# Patient Record
Sex: Male | Born: 1975 | Race: White | Hispanic: No | Marital: Single | Smoking: Never smoker
Health system: Southern US, Community
[De-identification: ages and names within clinical notes are randomized; demographics above are authoritative.]

## PROBLEM LIST (undated history)

## (undated) DIAGNOSIS — E785 Hyperlipidemia, unspecified: Secondary | ICD-10-CM

## (undated) DIAGNOSIS — M79606 Pain in leg, unspecified: Secondary | ICD-10-CM

## (undated) DIAGNOSIS — K859 Acute pancreatitis without necrosis or infection, unspecified: Secondary | ICD-10-CM

## (undated) DIAGNOSIS — K589 Irritable bowel syndrome without diarrhea: Secondary | ICD-10-CM

## (undated) DIAGNOSIS — T7840XA Allergy, unspecified, initial encounter: Secondary | ICD-10-CM

## (undated) DIAGNOSIS — I10 Essential (primary) hypertension: Secondary | ICD-10-CM

## (undated) DIAGNOSIS — G43909 Migraine, unspecified, not intractable, without status migrainosus: Secondary | ICD-10-CM

## (undated) DIAGNOSIS — K219 Gastro-esophageal reflux disease without esophagitis: Secondary | ICD-10-CM

## (undated) DIAGNOSIS — M7130 Other bursal cyst, unspecified site: Secondary | ICD-10-CM

## (undated) DIAGNOSIS — K760 Fatty (change of) liver, not elsewhere classified: Secondary | ICD-10-CM

## (undated) DIAGNOSIS — Z8719 Personal history of other diseases of the digestive system: Secondary | ICD-10-CM

## (undated) DIAGNOSIS — D649 Anemia, unspecified: Secondary | ICD-10-CM

## (undated) DIAGNOSIS — N281 Cyst of kidney, acquired: Secondary | ICD-10-CM

## (undated) HISTORY — DX: Other bursal cyst, unspecified site: M71.30

## (undated) HISTORY — DX: Cyst of kidney, acquired: N28.1

## (undated) HISTORY — DX: Essential (primary) hypertension: I10

## (undated) HISTORY — DX: Fatty (change of) liver, not elsewhere classified: K76.0

## (undated) HISTORY — PX: UPPER GASTROINTESTINAL ENDOSCOPY: SHX188

## (undated) HISTORY — DX: Anemia, unspecified: D64.9

## (undated) HISTORY — PX: UPPER GI ENDOSCOPY: SHX6162

## (undated) HISTORY — DX: Allergy, unspecified, initial encounter: T78.40XA

## (undated) HISTORY — PX: LASIK: SHX215

## (undated) HISTORY — PX: COLONOSCOPY: SHX174

## (undated) HISTORY — DX: Irritable bowel syndrome, unspecified: K58.9

## (undated) HISTORY — PX: ESOPHAGOGASTRODUODENOSCOPY: SHX1529

## (undated) HISTORY — DX: Acute pancreatitis without necrosis or infection, unspecified: K85.90

## (undated) HISTORY — DX: Gastro-esophageal reflux disease without esophagitis: K21.9

## (undated) HISTORY — DX: Pain in leg, unspecified: M79.606

## (undated) HISTORY — DX: Hyperlipidemia, unspecified: E78.5

---
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
## 1999-08-18 ENCOUNTER — Other Ambulatory Visit: Admission: RE | Admit: 1999-08-18 | Discharge: 1999-08-18 | Payer: Self-pay | Admitting: Urology

## 2005-03-01 ENCOUNTER — Ambulatory Visit: Payer: Self-pay | Admitting: Internal Medicine

## 2005-03-27 ENCOUNTER — Ambulatory Visit: Payer: Self-pay | Admitting: Internal Medicine

## 2005-04-03 ENCOUNTER — Ambulatory Visit: Payer: Self-pay | Admitting: Internal Medicine

## 2005-05-29 ENCOUNTER — Ambulatory Visit: Payer: Self-pay | Admitting: Internal Medicine

## 2005-06-08 ENCOUNTER — Ambulatory Visit: Payer: Self-pay | Admitting: Internal Medicine

## 2005-08-09 ENCOUNTER — Ambulatory Visit: Payer: Self-pay | Admitting: Internal Medicine

## 2006-02-12 ENCOUNTER — Ambulatory Visit: Payer: Self-pay | Admitting: Internal Medicine

## 2006-06-21 ENCOUNTER — Ambulatory Visit: Payer: Self-pay | Admitting: Internal Medicine

## 2006-07-05 ENCOUNTER — Ambulatory Visit: Payer: Self-pay | Admitting: Internal Medicine

## 2006-11-14 ENCOUNTER — Ambulatory Visit: Payer: Self-pay | Admitting: Internal Medicine

## 2006-11-14 LAB — CONVERTED CEMR LAB
AST: 65 units/L — ABNORMAL HIGH (ref 0–37)
Albumin: 4.1 g/dL (ref 3.5–5.2)
Alkaline Phosphatase: 49 units/L (ref 39–117)
HDL: 40.2 mg/dL (ref >39.0)
LDL DIRECT: 146.6 mg/dL
Total Bilirubin: 0.9 mg/dL (ref 0.3–1.2)
Total Protein: 7.1 g/dL (ref 6.0–8.3)
VLDL: 45 mg/dL — ABNORMAL HIGH (ref 0–40)

## 2006-11-27 HISTORY — PX: WRIST FRACTURE SURGERY: SHX121

## 2006-11-27 HISTORY — PX: CARPAL TUNNEL RELEASE: SHX101

## 2006-12-12 ENCOUNTER — Ambulatory Visit: Payer: Self-pay | Admitting: Internal Medicine

## 2007-02-04 ENCOUNTER — Ambulatory Visit: Payer: Self-pay | Admitting: Internal Medicine

## 2007-02-04 LAB — CONVERTED CEMR LAB
AST: 43 units/L — ABNORMAL HIGH (ref 0–37)
Bilirubin, Direct: 0.2 mg/dL (ref 0.0–0.3)
LDL Cholesterol: 105 mg/dL — ABNORMAL HIGH (ref 0–99)
Total Protein: 6.7 g/dL (ref 6.0–8.3)
Triglycerides: 137 mg/dL (ref 0–149)

## 2007-02-11 ENCOUNTER — Ambulatory Visit: Payer: Self-pay | Admitting: Internal Medicine

## 2007-06-06 ENCOUNTER — Ambulatory Visit: Payer: Self-pay | Admitting: Internal Medicine

## 2007-06-06 LAB — CONVERTED CEMR LAB
ALT: 49 units/L (ref 0–53)
Albumin: 3.8 g/dL (ref 3.5–5.2)
Alkaline Phosphatase: 46 units/L (ref 39–117)
Cholesterol: 193 mg/dL (ref 0–200)
HDL: 36.3 mg/dL — ABNORMAL LOW (ref >39.0)
Total Protein: 6.7 g/dL (ref 6.0–8.3)

## 2007-06-13 ENCOUNTER — Ambulatory Visit: Payer: Self-pay | Admitting: Internal Medicine

## 2007-08-15 ENCOUNTER — Telehealth: Payer: Self-pay | Admitting: Internal Medicine

## 2007-08-15 ENCOUNTER — Ambulatory Visit: Payer: Self-pay | Admitting: Internal Medicine

## 2007-08-15 DIAGNOSIS — L255 Unspecified contact dermatitis due to plants, except food: Secondary | ICD-10-CM | POA: Insufficient documentation

## 2007-08-15 DIAGNOSIS — K589 Irritable bowel syndrome without diarrhea: Secondary | ICD-10-CM

## 2007-08-16 DIAGNOSIS — E781 Pure hyperglyceridemia: Secondary | ICD-10-CM | POA: Insufficient documentation

## 2007-08-16 DIAGNOSIS — K219 Gastro-esophageal reflux disease without esophagitis: Secondary | ICD-10-CM

## 2007-10-28 ENCOUNTER — Emergency Department (HOSPITAL_COMMUNITY): Admission: EM | Admit: 2007-10-28 | Discharge: 2007-10-28 | Payer: Self-pay | Admitting: Emergency Medicine

## 2007-10-30 ENCOUNTER — Ambulatory Visit (HOSPITAL_BASED_OUTPATIENT_CLINIC_OR_DEPARTMENT_OTHER): Admission: RE | Admit: 2007-10-30 | Discharge: 2007-10-30 | Payer: Self-pay | Admitting: Orthopedic Surgery

## 2007-11-08 ENCOUNTER — Ambulatory Visit: Payer: Self-pay | Admitting: Internal Medicine

## 2007-11-08 LAB — CONVERTED CEMR LAB
Cholesterol, target level: 200 mg/dL
HDL goal, serum: 40 mg/dL

## 2008-03-09 ENCOUNTER — Ambulatory Visit: Payer: Self-pay | Admitting: Internal Medicine

## 2008-03-09 DIAGNOSIS — I10 Essential (primary) hypertension: Secondary | ICD-10-CM | POA: Insufficient documentation

## 2008-03-09 DIAGNOSIS — T887XXA Unspecified adverse effect of drug or medicament, initial encounter: Secondary | ICD-10-CM

## 2008-03-09 DIAGNOSIS — M25519 Pain in unspecified shoulder: Secondary | ICD-10-CM | POA: Insufficient documentation

## 2008-03-09 LAB — CONVERTED CEMR LAB
ALT: 75 units/L — ABNORMAL HIGH (ref 0–53)
AST: 40 units/L — ABNORMAL HIGH (ref 0–37)
Bilirubin, Direct: 0.2 mg/dL (ref 0.0–0.3)
HDL: 40.5 mg/dL (ref >39.0)
Triglycerides: 162 mg/dL — ABNORMAL HIGH (ref 0–149)

## 2008-03-16 ENCOUNTER — Telehealth: Payer: Self-pay | Admitting: Internal Medicine

## 2008-03-30 ENCOUNTER — Telehealth: Payer: Self-pay | Admitting: Internal Medicine

## 2008-04-22 ENCOUNTER — Telehealth: Payer: Self-pay | Admitting: Internal Medicine

## 2008-09-07 ENCOUNTER — Ambulatory Visit: Payer: Self-pay | Admitting: Internal Medicine

## 2008-09-07 LAB — CONVERTED CEMR LAB
BUN: 14 mg/dL (ref 6–23)
Chloride: 105 meq/L (ref 96–112)
Creatinine, Ser: 1.1 mg/dL (ref 0.4–1.5)
GFR calc non Af Amer: 82 mL/min
Sodium: 142 meq/L (ref 135–145)

## 2009-02-15 ENCOUNTER — Ambulatory Visit: Payer: Self-pay | Admitting: Internal Medicine

## 2009-02-15 LAB — CONVERTED CEMR LAB
AST: 50 units/L — ABNORMAL HIGH (ref 0–37)
Albumin: 4 g/dL (ref 3.5–5.2)
Bilirubin Urine: NEGATIVE
Blood in Urine, dipstick: NEGATIVE
CO2: 30 meq/L (ref 19–32)
Cholesterol: 184 mg/dL (ref 0–200)
Creatinine, Ser: 1.2 mg/dL (ref 0.4–1.5)
Eosinophils Absolute: 0.1 10*3/uL (ref 0.0–0.7)
Glucose, Urine, Semiquant: NEGATIVE
HCT: 41.1 % (ref 39.0–52.0)
Hemoglobin: 14.1 g/dL (ref 13.0–17.0)
LDL Cholesterol: 118 mg/dL — ABNORMAL HIGH (ref 0–99)
Lymphs Abs: 1.6 10*3/uL (ref 0.7–4.0)
MCV: 91.9 fL (ref 78.0–100.0)
Monocytes Absolute: 0.4 10*3/uL (ref 0.1–1.0)
Monocytes Relative: 8.9 % (ref 3.0–12.0)
Neutro Abs: 2.1 10*3/uL (ref 1.4–7.7)
Neutrophils Relative %: 49.8 % (ref 43.0–77.0)
Protein, U semiquant: NEGATIVE
RDW: 11.4 % — ABNORMAL LOW (ref 11.5–14.6)
Sodium: 143 meq/L (ref 135–145)
TSH: 1.84 microintl units/mL (ref 0.35–5.50)
Total Bilirubin: 0.9 mg/dL (ref 0.3–1.2)
Total Protein: 6.9 g/dL (ref 6.0–8.3)
Triglycerides: 158 mg/dL — ABNORMAL HIGH (ref 0.0–149.0)
Urobilinogen, UA: 0.2
VLDL: 31.6 mg/dL (ref 0.0–40.0)
WBC Urine, dipstick: NEGATIVE
pH: 5.5

## 2009-02-22 ENCOUNTER — Ambulatory Visit: Payer: Self-pay | Admitting: Internal Medicine

## 2009-02-22 DIAGNOSIS — K7689 Other specified diseases of liver: Secondary | ICD-10-CM

## 2009-05-10 ENCOUNTER — Ambulatory Visit: Payer: Self-pay | Admitting: Internal Medicine

## 2009-05-17 LAB — CONVERTED CEMR LAB
ALT: 101 units/L — ABNORMAL HIGH (ref 0–53)
Bilirubin, Direct: 0.2 mg/dL (ref 0.0–0.3)
Total Bilirubin: 1 mg/dL (ref 0.3–1.2)
Total Protein: 6.9 g/dL (ref 6.0–8.3)

## 2009-05-19 ENCOUNTER — Ambulatory Visit: Payer: Self-pay | Admitting: Internal Medicine

## 2009-05-19 DIAGNOSIS — R740 Nonspecific elevation of levels of transaminase and lactic acid dehydrogenase [LDH]: Secondary | ICD-10-CM

## 2009-05-19 LAB — CONVERTED CEMR LAB
Saturation Ratios: 20.8 % (ref 20.0–50.0)
Transferrin: 329.4 mg/dL (ref 212.0–360.0)

## 2009-05-21 ENCOUNTER — Encounter: Admission: RE | Admit: 2009-05-21 | Discharge: 2009-05-21 | Payer: Self-pay | Admitting: Internal Medicine

## 2009-05-21 LAB — CONVERTED CEMR LAB
HCV Ab: NEGATIVE
Hepatitis B Surface Ag: NEGATIVE

## 2009-07-07 ENCOUNTER — Ambulatory Visit: Payer: Self-pay | Admitting: Internal Medicine

## 2009-07-09 LAB — CONVERTED CEMR LAB
AST: 88 units/L — ABNORMAL HIGH (ref 0–37)
Alkaline Phosphatase: 44 units/L (ref 39–117)

## 2009-07-14 ENCOUNTER — Ambulatory Visit: Payer: Self-pay | Admitting: Internal Medicine

## 2009-10-08 ENCOUNTER — Ambulatory Visit: Payer: Self-pay | Admitting: Internal Medicine

## 2009-10-08 LAB — CONVERTED CEMR LAB
Albumin: 4.1 g/dL (ref 3.5–5.2)
Alkaline Phosphatase: 54 units/L (ref 39–117)
Total Protein: 7.3 g/dL (ref 6.0–8.3)

## 2009-10-15 ENCOUNTER — Ambulatory Visit: Payer: Self-pay | Admitting: Internal Medicine

## 2010-02-07 ENCOUNTER — Ambulatory Visit: Payer: Self-pay | Admitting: Internal Medicine

## 2010-02-07 LAB — CONVERTED CEMR LAB
Albumin: 3.8 g/dL (ref 3.5–5.2)
Alkaline Phosphatase: 50 units/L (ref 39–117)
Direct LDL: 115.9 mg/dL
HDL: 40.3 mg/dL (ref >39.00)
Total CHOL/HDL Ratio: 5
Triglycerides: 361 mg/dL — ABNORMAL HIGH (ref 0.0–149.0)
VLDL: 72.2 mg/dL — ABNORMAL HIGH (ref 0.0–40.0)

## 2010-02-14 ENCOUNTER — Ambulatory Visit: Payer: Self-pay | Admitting: Internal Medicine

## 2010-02-14 DIAGNOSIS — J301 Allergic rhinitis due to pollen: Secondary | ICD-10-CM | POA: Insufficient documentation

## 2010-05-12 ENCOUNTER — Ambulatory Visit: Payer: Self-pay | Admitting: Internal Medicine

## 2010-05-12 LAB — CONVERTED CEMR LAB
ALT: 80 units/L — ABNORMAL HIGH (ref 0–53)
Alkaline Phosphatase: 53 units/L (ref 39–117)
Direct LDL: 68.5 mg/dL
HDL: 38.3 mg/dL — ABNORMAL LOW (ref >39.00)
Total CHOL/HDL Ratio: 4
Triglycerides: 358 mg/dL — ABNORMAL HIGH (ref 0.0–149.0)
VLDL: 71.6 mg/dL — ABNORMAL HIGH (ref 0.0–40.0)

## 2010-05-16 ENCOUNTER — Ambulatory Visit: Payer: Self-pay | Admitting: Internal Medicine

## 2010-07-26 ENCOUNTER — Telehealth: Payer: Self-pay | Admitting: Internal Medicine

## 2010-07-26 ENCOUNTER — Ambulatory Visit: Payer: Self-pay | Admitting: Internal Medicine

## 2010-08-10 ENCOUNTER — Ambulatory Visit: Payer: Self-pay | Admitting: Internal Medicine

## 2010-08-10 LAB — CONVERTED CEMR LAB
ALT: 61 units/L — ABNORMAL HIGH (ref 0–53)
Albumin: 4 g/dL (ref 3.5–5.2)
Cholesterol: 192 mg/dL (ref 0–200)
LDL Cholesterol: 117 mg/dL — ABNORMAL HIGH (ref 0–99)
Triglycerides: 132 mg/dL (ref 0.0–149.0)

## 2010-08-17 ENCOUNTER — Ambulatory Visit: Payer: Self-pay | Admitting: Internal Medicine

## 2010-08-17 LAB — CONVERTED CEMR LAB: HDL goal, serum: 40 mg/dL

## 2010-12-29 NOTE — Progress Notes (Signed)
  Phone Note Call from Patient Call back at Home Phone [phone]   Caller: Patient Call For: Stacie Glaze MD Reason for Call: Acute Illness Summary of Call: Pt has poison ivy, and is asking to come to the office for steroid injection. [facility]) Initial call taken by: Lynann Beaver CMA,  July 26, 2010 9:37 AM  Follow-up for Phone Call        ov this pm Follow-up by: Willy Eddy, LPN,  July 26, 2010 10:29 AM

## 2010-12-29 NOTE — Assessment & Plan Note (Signed)
Summary: 3 MONTH FUP//CCM rsc bmp/njr   Vital Signs:  Patient profile:   35 year old male Height:      74 inches Weight:      212 pounds BMI:     27.32 Temp:     98.2 degrees F oral Pulse rate:   72 / minute Resp:     14 per minute BP sitting:   150 / 90  (left arm)  Vitals Entered By: Willy Eddy, LPN (May 16, 2010 11:18 AM) CC: roa labs, Lipid Management   Primary Care Provider:  Stacie Glaze MD  CC:  roa labs and Lipid Management.  History of Present Illness:  Hyperlipidemia Follow-Up      This is a right-handed patient who presents with Hyperlipidemia follow-up.  The patient denies muscle aches, GI upset, abdominal pain, flushing, itching, constipation, diarrhea, and fatigue.  The patient denies the following symptoms: chest pain/pressure, exercise intolerance, dypsnea, palpitations, syncope, and pedal edema.  Compliance with medications (by patient report) has been near 100%.  Dietary compliance has been excellent.  The patient reports exercising daily.  Adjunctive measures currently used by the patient include limiting alcohol consumpton and weight reduction.    Lipid Management History:      Positive NCEP/ATP III risk factors include HDL cholesterol less than 40 and hypertension.  Negative NCEP/ATP III risk factors include male age less than 14 years old, no family history for ischemic heart disease, and non-tobacco-user status.     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) 2)  Impetigo  (ICD-684) 3)  Transaminases, Serum, Elevated  (ICD-790.4) 4)  Fatty Liver Disease  (ICD-571.8) 5)  Preventive Health Care  (ICD-V70.0) 6)  Muscle Spasm  (ICD-728.85) 7)  Hypertension  (ICD-401.9) 8)  Uns Advrs Eff Uns Rx Medicinal&biological Sbstnc  (ICD-995.20) 9)  Shoulder Pain, Left  (ICD-719.41) 10)  Hyperlipidemia   (ICD-272.4) 11)  Gerd  (ICD-530.81) 12)  Irritable Bowel Syndrome  (ICD-564.1) 13)  Dermatitis, Contact, Due To Plants  (ICD-692.6)  Current Problems (verified): 1)  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 One By Mouth Daily 2)  Benicar 20 Mg  Tabs (Olmesartan Medoxomil) .... Once Daily 3)  Clidinium-Chlordiazepoxide 2.5-5 Mg  Caps (Clidinium-Chlordiazepoxide) .Marland Kitchen.. 1 Once Daily 4)  Vytorin 10-20 Mg Tabs (Ezetimibe-Simvastatin) .... One By Mouth Daily 5)  Zyrtec Allergy 10 Mg Tbdp (Cetirizine Hcl) .... One By Mouth Daily  Current Medications (verified): 1)  Pantoprazole Sodium 40 Mg Tbec (Pantoprazole Sodium) .... One By Mouth Daily 2)  Benicar 20 Mg  Tabs (Olmesartan Medoxomil) .... Once Daily 3)  Clidinium-Chlordiazepoxide 2.5-5 Mg  Caps (Clidinium-Chlordiazepoxide) .Marland Kitchen.. 1 Once Daily 4)  Tricor 145 Mg Tabs (Fenofibrate) .... One By Mouth Daily 5)  Zyrtec Allergy 10 Mg Tbdp (Cetirizine Hcl) .Marland KitchenMarland KitchenMarland Kitchen  One By Mouth Daily  Allergies (verified): No Known Drug Allergies  Past History:  Family History: Last updated: 08/15/2007 Family History of Bowel disease Family History High cholesterol Family History Hypertension  Social History: Last updated: 08/15/2007 Married Never Smoked Alcohol use-yes Drug use-no  Risk Factors: Smoking Status: never (05/16/2010) Passive Smoke Exposure: no (05/16/2010)  Past medical,  surgical, family and social histories (including risk factors) reviewed, and no changes noted (except as noted below).  Past Medical History: Reviewed history from 03/09/2008 and no changes required. GERD Hyperlipidemia IBS Hypertension  Past Surgical History: Reviewed history from 11/08/2007 and no changes required. right wrist surgery with plate  Family History: Reviewed history from 08/15/2007 and no changes required. Family History of Bowel disease Family History High cholesterol Family History Hypertension  Social History: Reviewed history from 08/15/2007 and no changes required. Married Never Smoked Alcohol use-yes Drug use-no  Review of Systems  The patient denies anorexia, fever, weight loss, weight gain, vision loss, decreased hearing, hoarseness, chest pain, syncope, dyspnea on exertion, peripheral edema, prolonged cough, headaches, hemoptysis, abdominal pain, melena, hematochezia, severe indigestion/heartburn, hematuria, incontinence, genital sores, muscle weakness, suspicious skin lesions, transient blindness, difficulty walking, depression, unusual weight change, abnormal bleeding, enlarged lymph nodes, angioedema, and breast masses.    Physical Exam  General:  Well-developed,well-nourished,in no acute distress; alert,appropriate and cooperative throughout examination Head:  Normocephalic and atraumatic without obvious abnormalities. No apparent alopecia or balding. Eyes:  pupils equal and pupils round.   Nose:  no external deformity and no nasal discharge.   Mouth:  Oral mucosa and oropharynx without lesions or exudates.  Teeth in good repair. Neck:  No deformities, masses, or tenderness noted. Lungs:  Normal respiratory effort, chest expands symmetrically. Lungs are clear to auscultation, no crackles or wheezes. Heart:  Normal rate and regular rhythm. S1 and S2 normal without gallop, murmur, click, rub or other extra sounds. Abdomen:  soft, non-tender, and no  masses.   Msk:  normal ROM and no joint tenderness.   Neurologic:  No cranial nerve deficits noted. Station and gait are normal. Plantar reflexes are down-going bilaterally. DTRs are symmetrical throughout. Sensory, motor and coordinative functions appear intact.   Impression & Recommendations:  Problem # 1:  FATTY LIVER DISEASE (ICD-571.8) 5-10 loss needed  Problem # 2:  HYPERTENSION (ICD-401.9)  His updated medication list for this problem includes:    Benicar 20 Mg Tabs (Olmesartan medoxomil) ..... Once daily  BP today: 150/90 Prior BP: 122/80 (02/14/2010)  10 Yr Risk Heart Disease: 6 % Prior 10 Yr Risk Heart Disease: 4 % (10/15/2009)  Labs Reviewed: K+: 4.1 (02/15/2009) Creat: : 1.2 (02/15/2009)   Chol: 156 (05/12/2010)   HDL: 38.30 (05/12/2010)   LDL: 118 (02/15/2009)   TG: 358.0 (05/12/2010)  Problem # 3:  HYPERLIPIDEMIA (ICD-272.4) change to fibrinated and moniter His updated medication list for this problem includes:    Tricor 145 Mg Tabs (Fenofibrate) ..... One by mouth daily  Labs Reviewed: SGOT: 43 (05/12/2010)   SGPT: 80 (05/12/2010)  Lipid Goals: Chol Goal: 200 (11/08/2007)   HDL Goal: 40 (11/08/2007)   LDL Goal: 160 (11/08/2007)   TG Goal: 150 (11/08/2007)  10 Yr Risk Heart Disease: 6 % Prior 10 Yr Risk Heart Disease: 4 % (10/15/2009)   HDL:38.30 (05/12/2010), 40.30 (02/07/2010)  LDL:118 (02/15/2009), DEL (03/09/2008)  Chol:156 (05/12/2010), 203 (02/07/2010)  Trig:358.0 (05/12/2010), 361.0 (02/07/2010)  Complete Medication List: 1)  Pantoprazole Sodium 40 Mg Tbec (Pantoprazole sodium) .... One by mouth daily 2)  Benicar 20 Mg Tabs (Olmesartan medoxomil) .... Once daily 3)  Clidinium-chlordiazepoxide 2.5-5 Mg Caps (Clidinium-chlordiazepoxide) .Marland Kitchen.. 1 once daily 4)  Tricor 145 Mg Tabs (Fenofibrate) .... One by mouth daily 5)  Zyrtec Allergy 10 Mg Tbdp (Cetirizine hcl) .... One by mouth daily  Lipid Assessment/Plan:      Based on NCEP/ATP III, the patient's  risk factor category is "2 or more risk factors and a calculated 10 year CAD risk of < 20%".  The patient's lipid goals are as follows: Total cholesterol goal is 200; LDL cholesterol goal is 160; HDL cholesterol goal is 40; Triglyceride goal is 150.  His LDL cholesterol goal has been met.    Patient Instructions: 1)  weight needs to be around 200 2)  change back to the tricor 3)  Please schedule a follow-up appointment in 3 months. 4)  Hepatic Panel prior to visit, ICD-9:995.20 5)  Lipid Panel prior to visit, ICD-9:272.4

## 2010-12-29 NOTE — Assessment & Plan Note (Signed)
Summary: 4 MO ROV/MM   Vital Signs:  Patient profile:   35 year old male Height:      74 inches Weight:      216 pounds BMI:     27.83 Temp:     98.2 degrees F oral Pulse rate:   72 / minute Resp:     14 per minute BP sitting:   122 / 80  (left arm)  Vitals Entered By: Willy Eddy, LPN (February 14, 2010 8:28 AM)  Nutrition Counseling: Patient's BMI is greater than 25 and therefore counseled on weight management options. CC: roa labs, Hypertension Management   CC:  roa labs and Hypertension Management.  History of Present Illness: Fatty liver monitering and lipids Head ache  occur even awakening, unpredictable. May be related to computor use but they occur on tthe weekend. These repsond to excedrine migraine Has increased alleries Cannot tell is they have accelerated over the past few weeks with onset of allergy season The have characteristis of cluster  Hypertension History:      He denies headache, chest pain, palpitations, dyspnea with exertion, orthopnea, PND, peripheral edema, visual symptoms, neurologic problems, syncope, and side effects from treatment.        Positive major cardiovascular risk factors include hyperlipidemia and hypertension.  Negative major cardiovascular risk factors include male age less than 72 years old, negative family history for ischemic heart disease, and non-tobacco-user status.     Preventive Screening-Counseling & Management  Alcohol-Tobacco     Smoking Status: never     Passive Smoke Exposure: no  Problems Prior to Update: 1)  Impetigo  (ICD-684) 2)  Transaminases, Serum, Elevated  (ICD-790.4) 3)  Fatty Liver Disease  (ICD-571.8) 4)  Preventive Health Care  (ICD-V70.0) 5)  Muscle Spasm  (ICD-728.85) 6)  Hypertension  (ICD-401.9) 7)  Uns Advrs Eff Uns Rx Medicinal&biological Sbstnc  (ICD-995.20) 8)  Shoulder Pain, Left  (ICD-719.41) 9)  Hyperlipidemia  (ICD-272.4) 10)  Gerd  (ICD-530.81) 11)  Irritable Bowel Syndrome   (ICD-564.1) 12)  Dermatitis, Contact, Due To Plants  (ICD-692.6)  Current Problems (verified): 1)  Impetigo  (ICD-684) 2)  Transaminases, Serum, Elevated  (ICD-790.4) 3)  Fatty Liver Disease  (ICD-571.8) 4)  Preventive Health Care  (ICD-V70.0) 5)  Muscle Spasm  (ICD-728.85) 6)  Hypertension  (ICD-401.9) 7)  Uns Advrs Eff Uns Rx Medicinal&biological Sbstnc  (ICD-995.20) 8)  Shoulder Pain, Left  (ICD-719.41) 9)  Hyperlipidemia  (ICD-272.4) 10)  Gerd  (ICD-530.81) 11)  Irritable Bowel Syndrome  (ICD-564.1) 12)  Dermatitis, Contact, Due To Plants  (ICD-692.6)  Medications Prior to Update: 1)  Pantoprazole Sodium 40 Mg Tbec (Pantoprazole Sodium) .... One By Mouth Daily 2)  Benicar 20 Mg  Tabs (Olmesartan Medoxomil) .... Once Daily 3)  Clidinium-Chlordiazepoxide 2.5-5 Mg  Caps (Clidinium-Chlordiazepoxide) .Marland Kitchen.. 1 Once Daily 4)  Doxycycline Hyclate 100 Mg Caps (Doxycycline Hyclate) .... One By Mouth  Bid  Current Medications (verified): 1)  Pantoprazole Sodium 40 Mg Tbec (Pantoprazole Sodium) .... One By Mouth Daily 2)  Benicar 20 Mg  Tabs (Olmesartan Medoxomil) .... Once Daily 3)  Clidinium-Chlordiazepoxide 2.5-5 Mg  Caps (Clidinium-Chlordiazepoxide) .Marland Kitchen.. 1 Once Daily  Allergies (verified): No Known Drug Allergies  Past History:  Family History: Last updated: 08/15/2007 Family History of Bowel disease Family History High cholesterol Family History Hypertension  Social History: Last updated: 08/15/2007 Married Never Smoked Alcohol use-yes Drug use-no  Risk Factors: Smoking Status: never (02/14/2010) Passive Smoke Exposure: no (02/14/2010)  Past medical, surgical, family  and social histories (including risk factors) reviewed, and no changes noted (except as noted below).  Past Medical History: Reviewed history from 03/09/2008 and no changes required. GERD Hyperlipidemia IBS Hypertension  Past Surgical History: Reviewed history from 11/08/2007 and no changes  required. right wrist surgery with plate  Family History: Reviewed history from 08/15/2007 and no changes required. Family History of Bowel disease Family History High cholesterol Family History Hypertension  Social History: Reviewed history from 08/15/2007 and no changes required. Married Never Smoked Alcohol use-yes Drug use-no  Review of Systems  The patient denies anorexia, fever, weight loss, weight gain, vision loss, decreased hearing, hoarseness, chest pain, syncope, dyspnea on exertion, peripheral edema, prolonged cough, headaches, hemoptysis, abdominal pain, melena, hematochezia, severe indigestion/heartburn, hematuria, incontinence, genital sores, muscle weakness, suspicious skin lesions, transient blindness, difficulty walking, depression, unusual weight change, abnormal bleeding, enlarged lymph nodes, angioedema, breast masses, and testicular masses.    Physical Exam  General:  Well-developed,well-nourished,in no acute distress; alert,appropriate and cooperative throughout examination Eyes:  pupils equal and pupils round.   Ears:  R ear normal and L ear normal.   Nose:  no external deformity and no nasal discharge.   Mouth:  Oral mucosa and oropharynx without lesions or exudates.  Teeth in good repair. Neck:  No deformities, masses, or tenderness noted. Lungs:  Normal respiratory effort, chest expands symmetrically. Lungs are clear to auscultation, no crackles or wheezes. Heart:  Normal rate and regular rhythm. S1 and S2 normal without gallop, murmur, click, rub or other extra sounds.   Impression & Recommendations:  Problem # 1:  FATTY LIVER DISEASE (ICD-571.8) Assessment Unchanged weight loss  goal is 190   20 m- 25 pounds  Problem # 2:  TRANSAMINASES, SERUM, ELEVATED (ICD-790.4) Assessment: Unchanged the pr has had prior work up fr faty liver dz  Problem # 3:  HYPERTENSION (ICD-401.9)  His updated medication list for this problem includes:    Benicar 20 Mg  Tabs (Olmesartan medoxomil) ..... Once daily  BP today: 122/80 Prior BP: 120/80 (10/15/2009)  Prior 10 Yr Risk Heart Disease: 4 % (10/15/2009)  Labs Reviewed: K+: 4.1 (02/15/2009) Creat: : 1.2 (02/15/2009)   Chol: 203 (02/07/2010)   HDL: 40.30 (02/07/2010)   LDL: 118 (02/15/2009)   TG: 361.0 (02/07/2010)  Problem # 4:  HYPERLIPIDEMIA (ICD-272.4)  Labs Reviewed: SGOT: 38 (02/07/2010)   SGPT: 83 (02/07/2010)  Lipid Goals: Chol Goal: 200 (11/08/2007)   HDL Goal: 40 (11/08/2007)   LDL Goal: 160 (11/08/2007)   TG Goal: 150 (11/08/2007)  Prior 10 Yr Risk Heart Disease: 4 % (10/15/2009)   HDL:40.30 (02/07/2010), 34.30 (02/15/2009)  LDL:118 (02/15/2009), DEL (03/09/2008)  Chol:203 (02/07/2010), 184 (02/15/2009)  Trig:361.0 (02/07/2010), 158.0 (02/15/2009)  His updated medication list for this problem includes:    Vytorin 10-20 Mg Tabs (Ezetimibe-simvastatin) ..... One by mouth daily  Problem # 5:  ALLERGIC RHINITIS DUE TO POLLEN (ICD-477.0) with head ached  \diary kept  Complete Medication List: 1)  Pantoprazole Sodium 40 Mg Tbec (Pantoprazole sodium) .... One by mouth daily 2)  Benicar 20 Mg Tabs (Olmesartan medoxomil) .... Once daily 3)  Clidinium-chlordiazepoxide 2.5-5 Mg Caps (Clidinium-chlordiazepoxide) .Marland Kitchen.. 1 once daily 4)  Vytorin 10-20 Mg Tabs (Ezetimibe-simvastatin) .... One by mouth daily 5)  Zyrtec Allergy 10 Mg Tbdp (Cetirizine hcl) .... One by mouth daily  Hypertension Assessment/Plan:      The patient's hypertensive risk group is category B: At least one risk factor (excluding diabetes) with no target organ damage.  His calculated  10 year risk of coronary heart disease is 4 %.  Today's blood pressure is 122/80.  His blood pressure goal is < 140/90.   Patient Instructions: 1)  Please schedule a follow-up appointment in 3 months. 2)  Hepatic Panel prior to visit, ICD-9:995.20 3)  Lipid Panel prior to visit, ICD-9:272.4

## 2010-12-29 NOTE — Assessment & Plan Note (Signed)
Summary: 3 mo rov/mm   Vital Signs:  Patient profile:   35 year old male Height:      74 inches Weight:      210 pounds BMI:     27.06 Temp:     98.2 degrees F oral Pulse rate:   72 / minute Resp:     14 per minute BP sitting:   140 / 90  (left arm)  Vitals Entered By: Willy Eddy, LPN (August 17, 2010 11:25 AM) CC: roa labs, Lipid Management Is Patient Diabetic? No   Primary Care Provider:  Stacie Glaze MD  CC:  roa labs and Lipid Management.  History of Present Illness: follow up management of cholesterol complicated by fatty liver  Lipid Management History:      Positive NCEP/ATP III risk factors include HDL cholesterol less than 40 and hypertension.  Negative NCEP/ATP III risk factors include male age less than 36 years old, non-diabetic, no family history for ischemic heart disease, non-tobacco-user status, no ASHD (atherosclerotic heart disease), no prior stroke/TIA, no peripheral vascular disease, and no history of aortic aneurysm.     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) 2)  Impetigo  (ICD-684) 3)  Transaminases, Serum, Elevated  (ICD-790.4) 4)  Fatty Liver Disease  (ICD-571.8) 5)  Preventive Health Care  (ICD-V70.0) 6)  Muscle Spasm  (ICD-728.85) 7)  Hypertension  (ICD-401.9) 8)  Uns Advrs Eff Uns Rx Medicinal&biological Sbstnc  (ICD-995.20) 9)  Shoulder Pain, Left  (ICD-719.41) 10)  Hyperlipidemia  (ICD-272.4) 11)  Gerd  (ICD-530.81) 12)  Irritable Bowel Syndrome  (ICD-564.1) 13)  Dermatitis, Contact, Due To Plants  (ICD-692.6)  Current Problems (verified): 1)  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 One By Mouth Daily 2)  Benicar 20 Mg  Tabs (Olmesartan Medoxomil) .... Once Daily 3)  Clidinium-Chlordiazepoxide 2.5-5 Mg  Caps (Clidinium-Chlordiazepoxide) .Marland Kitchen.. 1 Once Daily 4)  Tricor 145 Mg Tabs (Fenofibrate) .... One By Mouth Daily 5)  Zyrtec Allergy 10 Mg Tbdp (Cetirizine Hcl) .... One By Mouth Daily 6)  Prednisone (Pak) 10 Mg Tabs (Prednisone) .Marland Kitchen.. 12 Day Dose Pack- Take As Directed  Current Medications (verified): 1)  Pantoprazole Sodium 40 Mg Tbec (Pantoprazole Sodium) .... One By Mouth Daily 2)  Benicar 20 Mg  Tabs (Olmesartan Medoxomil) .... Once Daily 3)  Clidinium-Chlordiazepoxide 2.5-5 Mg  Caps (Clidinium-Chlordiazepoxide) .Marland Kitchen.. 1 Once Daily 4)  Tricor 145 Mg Tabs (Fenofibrate) .... One By Mouth Daily  Allergies (verified): No Known Drug Allergies  Past History:  Family History: Last updated: 08/15/2007 Family History of Bowel disease Family History High cholesterol Family History Hypertension  Social History: Last updated: 08/15/2007 Married Never Smoked Alcohol use-yes Drug use-no  Risk Factors: Smoking Status: never (08/17/2010) Passive Smoke Exposure: no (08/17/2010)  Past  medical, surgical, family and social histories (including risk factors) reviewed for relevance to current acute and chronic problems.  Past Medical History: Reviewed history from 03/09/2008 and no changes required. GERD Hyperlipidemia IBS Hypertension  Past Surgical History: Reviewed history from 11/08/2007 and no changes required. right wrist surgery with plate  Family History: Reviewed  history from 08/15/2007 and no changes required. Family History of Bowel disease Family History High cholesterol Family History Hypertension  Social History: Reviewed history from 08/15/2007 and no changes required. Married Never Smoked Alcohol use-yes Drug use-no  Review of Systems  The patient denies anorexia, fever, weight loss, weight gain, vision loss, decreased hearing, hoarseness, chest pain, syncope, dyspnea on exertion, peripheral edema, prolonged cough, headaches, hemoptysis, abdominal pain, melena, hematochezia, severe indigestion/heartburn, hematuria, incontinence, genital sores, muscle weakness, suspicious skin lesions, transient blindness, difficulty walking, depression, unusual weight change, abnormal bleeding, enlarged lymph nodes, angioedema, breast masses, and testicular masses.         Flu Vaccine Consent Questions     Do you have a history of severe allergic reactions to this vaccine? no    Any prior history of allergic reactions to egg and/or gelatin? no    Do you have a sensitivity to the preservative Thimersol? no    Do you have a past history of Guillan-Barre Syndrome? no    Do you currently have an acute febrile illness? no    Have you ever had a severe reaction to latex? no    Vaccine information given and explained to patient? yes    Are you currently pregnant? no    Lot Number:[id number]   Exp Date:05/27/2011   Site Given  Left Deltoid IM   Physical Exam  General:  Well-developed,well-nourished,in no acute distress; alert,appropriate and cooperative throughout examination Head:  Normocephalic and atraumatic without obvious abnormalities. No apparent alopecia or balding. Eyes:  pupils equal and pupils round.   Ears:  R ear normal and L ear normal.   Nose:  no external deformity and no nasal discharge.   Neck:  No deformities, masses, or tenderness noted. Lungs:  Normal respiratory effort, chest expands symmetrically. Lungs are clear to auscultation, no  crackles or wheezes. Heart:  Normal rate and regular rhythm. S1 and S2 normal without gallop, murmur, click, rub or other extra sounds. Abdomen:  soft, non-tender, and no masses.   Msk:  normal ROM and no joint tenderness.   Extremities:  No clubbing, cyanosis, edema, or deformity noted with normal full range of motion of all joints.   Neurologic:  No cranial nerve deficits noted. Station and gait are normal. Plantar reflexes are down-going bilaterally. DTRs are symmetrical throughout. Sensory, motor and coordinative functions appear intact.   Impression & Recommendations:  Problem # 1:  FATTY LIVER DISEASE (ICD-571.8) stable  Problem # 2:  TRANSAMINASES, SERUM, ELEVATED (ICD-790.4)  Problem # 3:  HYPERTENSION (ICD-401.9)  His updated medication list for this problem includes:    Benicar 20 Mg Tabs (Olmesartan medoxomil) ..... Once daily  BP today: 140/90 Prior BP: 150/90 (05/16/2010)  Prior 10 Yr Risk Heart Disease: 6 % (05/16/2010)  Labs Reviewed: K+: 4.1 (02/15/2009) Creat: : 1.2 (02/15/2009)   Chol: 156 (05/12/2010)   HDL: 38.30 (05/12/2010)   LDL: 118 (02/15/2009)   TG: 358.0 (05/12/2010)  Complete Medication List: 1)  Pantoprazole Sodium 40 Mg Tbec (Pantoprazole sodium) .... One by mouth daily 2)  Benicar 20 Mg Tabs (Olmesartan medoxomil) .... Once daily 3)  Clidinium-chlordiazepoxide 2.5-5 Mg Caps (Clidinium-chlordiazepoxide) .Marland Kitchen.. 1 once  daily 4)  Tricor 145 Mg Tabs (Fenofibrate) .... One by mouth daily  Other Orders: Admin 1st Vaccine (16109) Flu Vaccine 78yrs + [phone])  Lipid Assessment/Plan:      Based on NCEP/ATP III, the patient's risk factor category is "2 or more risk factors and a calculated 10 year CAD risk of < 20%".  The patient's lipid goals are as follows: Total cholesterol goal is 200; LDL cholesterol goal is 130; HDL cholesterol goal is 40; Triglyceride goal is 150.  His LDL cholesterol goal has been met.    Patient Instructions: 1)  CPX in  march Prescriptions: TRICOR 145 MG TABS (FENOFIBRATE) one by mouth daily  #90 x 3   Entered and Authorized by:   Stacie Glaze MD   Signed by:   Stacie Glaze MD on 08/17/2010   Method used:   Faxed to ...       [facility] (mail-order)             , [place]         Ph: [phone]       Fax: [phone]   RxID:   [id number] BENICAR 20 MG  TABS (OLMESARTAN MEDOXOMIL) once daily  #90 x 3   Entered and Authorized by:   Stacie Glaze MD   Signed by:   Stacie Glaze MD on 08/17/2010   Method used:   Faxed to ...       [facility] (mail-order)             , [place]         Ph: [phone]       Fax: [phone]   RxID:   [id number]

## 2010-12-29 NOTE — Assessment & Plan Note (Signed)
Summary: poison ivy /bmw   Vital Signs:  Patient profile:   35 year old male Height:      74 inches Weight:      212 pounds BMI:     27.32 Temp:     98.3 degrees F oral Pulse rate:   72 / minute Resp:     14 per minute  Vitals Entered By: Willy Eddy, LPN (July 26, 2010 4:30 PM) CC: c/o poison ivy on penis Is Patient Diabetic? No   Primary Care Provider:  Stacie Glaze MD  CC:  c/o poison ivy on penis.  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) 2)  Impetigo  (ICD-684) 3)  Transaminases, Serum, Elevated  (ICD-790.4) 4)  Fatty Liver Disease  (ICD-571.8) 5)  Preventive Health Care  (ICD-V70.0) 6)  Muscle Spasm  (ICD-728.85) 7)  Hypertension  (ICD-401.9) 8)  Uns Advrs Eff Uns Rx Medicinal&biological Sbstnc  (ICD-995.20) 9)  Shoulder Pain, Left  (ICD-719.41) 10)  Hyperlipidemia  (ICD-272.4) 11)  Gerd  (ICD-530.81) 12)  Irritable Bowel Syndrome  (ICD-564.1) 13)  Dermatitis, Contact, Due To Plants  (ICD-692.6)  Current Problems (verified): 1)  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 One By Mouth Daily 2)  Benicar 20 Mg  Tabs (Olmesartan Medoxomil) .... Once  Daily 3)  Clidinium-Chlordiazepoxide 2.5-5 Mg  Caps (Clidinium-Chlordiazepoxide) .Marland Kitchen.. 1 Once Daily 4)  Tricor 145 Mg Tabs (Fenofibrate) .... One By Mouth Daily 5)  Zyrtec Allergy 10 Mg Tbdp (Cetirizine Hcl) .... One By Mouth Daily  Current Medications (verified): 1)  Pantoprazole Sodium 40 Mg Tbec (Pantoprazole Sodium) .... One By Mouth Daily 2)  Benicar 20 Mg  Tabs (Olmesartan Medoxomil) .... Once Daily 3)  Clidinium-Chlordiazepoxide 2.5-5 Mg  Caps (Clidinium-Chlordiazepoxide) .Marland Kitchen.. 1 Once Daily 4)  Tricor 145 Mg Tabs (Fenofibrate) .... One By Mouth Daily 5)  Zyrtec Allergy 10 Mg Tbdp (Cetirizine Hcl) .... One By Mouth Daily  Allergies (verified): No Known Drug Allergies  Past History:  Family History: Last updated: 08/15/2007 Family History of Bowel disease Family History High cholesterol Family History Hypertension  Social History: Last updated: 08/15/2007 Married Never Smoked Alcohol use-yes Drug use-no  Risk Factors: Smoking Status: never (07/26/2010) Passive Smoke Exposure: no (07/26/2010)  Past medical, surgical, family and social histories (including risk factors) reviewed, and no changes noted (except as noted below).  Past Medical History: Reviewed history from 03/09/2008 and no changes required. GERD Hyperlipidemia IBS Hypertension  Past Surgical History: Reviewed history from 11/08/2007 and no changes required. right wrist surgery with plate  Family History: Reviewed history from 08/15/2007 and no changes required. Family History of Bowel disease Family History High cholesterol Family History Hypertension  Social History: Reviewed history from 08/15/2007 and no changes required. Married Never Smoked Alcohol use-yes Drug use-no  Physical Exam  General:  Well-developed,well-nourished,in no acute distress; alert,appropriate and cooperative throughout examination Lungs:  Normal respiratory effort, chest expands symmetrically. Lungs are clear to  auscultation, no crackles or wheezes. Heart:  Normal rate and regular rhythm. S1 and S2 normal without gallop, murmur, click, rub or other extra sounds. Skin:  atopic rash on penis Cervical Nodes:  No lymphadenopathy noted Axillary Nodes:  No palpable lymphadenopathy   Impression & Recommendations:  Problem # 1:  DERMATITIS, CONTACT, DUE TO PLANTS (ICD-692.6) presniosone does pack and local care His updated medication list for this problem includes:    Zyrtec Allergy 10 Mg Tbdp (Cetirizine hcl) ..... One by mouth daily    Prednisone (pak) 10 Mg Tabs (Prednisone) .Marland Kitchen... 12 day dose pack- take as directed  Orders: Admin of Therapeutic Inj  intramuscular or subcutaneous (04540) Depo- Medrol 80mg  (J1040)  Discussed avoidance of triggers and symptomatic treatment.   Complete Medication List: 1)  Pantoprazole Sodium 40 Mg Tbec (Pantoprazole sodium) .... One by mouth daily 2)  Benicar 20 Mg Tabs (Olmesartan medoxomil) .... Once daily 3)  Clidinium-chlordiazepoxide 2.5-5 Mg Caps (Clidinium-chlordiazepoxide) .Marland Kitchen.. 1 once daily 4)  Tricor 145 Mg Tabs (Fenofibrate) .... One by mouth daily 5)  Zyrtec Allergy 10 Mg Tbdp (Cetirizine hcl) .... One by mouth daily 6)  Prednisone (pak) 10 Mg Tabs (Prednisone) .Marland Kitchen.. 12 day dose pack- take as directed  Patient Instructions: 1)  complete prednisone Prescriptions: PREDNISONE (PAK) 10 MG TABS (PREDNISONE) 12 day dose pack- take as directed  #1 x 0   Entered by:   Willy Eddy, LPN   Authorized by:   Stacie Glaze MD   Signed by:   Willy Eddy, LPN on 98/09/9146   Method used:   Electronically to        [facility] [phone]* (retail)       [address]       [place], [place]  [address]       Ph: [phone]       Fax: [phone]   RxID:   [id number]    Medication Administration  Injection # 1:    Medication: Depo- Medrol 80mg     Diagnosis: DERMATITIS, CONTACT, DUE TO PLANTS (ICD-692.6)     Route: IM    Site: L thigh    Exp Date: 03/26/2013    Lot #: [id number]    Mfr: Pharmacia    Comments: 1.48ml/120mccg given    Patient tolerated injection without complications    Given by: Willy Eddy, LPN (July 26, 2010 4:37 PM)  Orders Added: 1)  Admin of Therapeutic Inj  intramuscular or subcutaneous [96372] 2)  Depo- Medrol 80mg  [J1040] 3)  Est. Patient Level III [42595]

## 2011-01-24 ENCOUNTER — Other Ambulatory Visit: Payer: Self-pay | Admitting: Internal Medicine

## 2011-02-23 ENCOUNTER — Other Ambulatory Visit: Payer: Self-pay

## 2011-02-24 ENCOUNTER — Telehealth: Payer: Self-pay | Admitting: *Deleted

## 2011-02-24 DIAGNOSIS — L237 Allergic contact dermatitis due to plants, except food: Secondary | ICD-10-CM

## 2011-02-24 MED ORDER — PREDNISONE (PAK) 10 MG PO TABS: 10.0000 mg | ORAL_TABLET | Freq: Every day | ORAL | Status: AC

## 2011-02-24 NOTE — Telephone Encounter (Signed)
Would like steroids for poison ivy called to [facility])

## 2011-02-24 NOTE — Telephone Encounter (Signed)
Ok 10 mg prednisone pack per dr Gerre Scull

## 2011-03-02 ENCOUNTER — Encounter: Payer: Self-pay | Admitting: Internal Medicine

## 2011-03-14 ENCOUNTER — Encounter: Payer: Self-pay | Admitting: Internal Medicine

## 2011-03-14 ENCOUNTER — Telehealth: Payer: Self-pay | Admitting: *Deleted

## 2011-03-14 ENCOUNTER — Ambulatory Visit (INDEPENDENT_AMBULATORY_CARE_PROVIDER_SITE_OTHER): Payer: 59 | Admitting: Internal Medicine

## 2011-03-14 VITALS — BP 125/80 | HR 72 | Temp 98.2°F | Resp 14

## 2011-03-14 DIAGNOSIS — R21 Rash and other nonspecific skin eruption: Secondary | ICD-10-CM

## 2011-03-14 DIAGNOSIS — L255 Unspecified contact dermatitis due to plants, except food: Secondary | ICD-10-CM

## 2011-03-14 MED ORDER — PREDNISONE (PAK) 10 MG PO TABS: 10.0000 mg | ORAL_TABLET | Freq: Every day | ORAL | Status: AC

## 2011-03-14 MED ORDER — METHYLPREDNISOLONE ACETATE 80 MG/ML IJ SUSP
120.0000 mg | Freq: Once | INTRAMUSCULAR | Status: AC
  Administered 2011-03-14: 120 mg via INTRAMUSCULAR

## 2011-03-14 NOTE — Telephone Encounter (Signed)
Work in today

## 2011-03-14 NOTE — Telephone Encounter (Signed)
Appt scheduled

## 2011-03-14 NOTE — Telephone Encounter (Signed)
Pt was exposed to more poison ivy, and is broken out again on legs, abdomen, and both legs. Would like to have an injection.

## 2011-03-14 NOTE — Progress Notes (Signed)
  Subjective:    Patient ID: Christopher [name], male    DOB: [birth year], 35 y.o.   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  He was unable to sleep last night due to the itching he denies any fever chills nausea or vomiting he finished a prednisone course several weeks earlier.     Review of Systems  Constitutional: Negative for fever and fatigue.  HENT: Negative for hearing loss, congestion, neck pain and postnasal drip.   Eyes: Negative for discharge, redness and visual disturbance.  Respiratory: Negative for cough, shortness of breath and wheezing.   Cardiovascular: Negative for leg swelling.  Gastrointestinal: Negative for abdominal pain, constipation and abdominal distention.  Genitourinary: Negative for urgency and frequency.  Musculoskeletal: Negative for joint swelling and arthralgias.  Skin: Positive for rash. Negative for color change.  Neurological: Negative for weakness and light-headedness.  Hematological: Negative for adenopathy.  Psychiatric/Behavioral: Negative for behavioral problems.   Past Medical History  Diagnosis Date  . GERD (gastroesophageal reflux disease)   . Hyperlipidemia   . IBS (irritable bowel syndrome)   . Hypertension    Past Surgical History  Procedure Date  . Right wrist  surgery with plate     reports that he has never smoked. He does not have any smokeless tobacco history on file. He reports that he drinks alcohol. He reports that he does not use illicit drugs. family history includes Hyperlipidemia in his mother and Hypertension in his father and mother. No Known Allergies     Objective:   Physical Exam  Constitutional: He appears well-developed and well-nourished.  HENT:  Head: Normocephalic and atraumatic.  Eyes: Conjunctivae are normal. Pupils  are equal, round, and reactive to light.  Neck: Normal range of motion. Neck supple.  Cardiovascular: Normal rate and regular rhythm.   Pulmonary/Chest: Effort normal and breath sounds normal.  Abdominal: Soft. Bowel sounds are normal.  Skin: Rash noted.          Assessment & Plan:  This is moderately severe poison ivy he would get a shot of Depo-Medrol 120 IM and a prednisone burst and taper for 10 days do to the fact that he has recurrent poison ivy from working in his yard we discussed avoidance measures such as wearing gloves long sleeves and T-shirts and using Clorox wipes on the skin to neutralize the well.

## 2011-03-14 NOTE — Telephone Encounter (Signed)
Ov given 

## 2011-03-24 ENCOUNTER — Other Ambulatory Visit (INDEPENDENT_AMBULATORY_CARE_PROVIDER_SITE_OTHER): Payer: 59 | Admitting: Internal Medicine

## 2011-03-24 DIAGNOSIS — Z0000 Encounter for general adult medical examination without abnormal findings: Secondary | ICD-10-CM

## 2011-03-24 LAB — BASIC METABOLIC PANEL
BUN: 21 mg/dL (ref 6–23)
CO2: 25 mEq/L (ref 19–32)
Creatinine, Ser: 1.3 mg/dL (ref 0.4–1.5)
GFR: 67.77 mL/min (ref >60.00)
Glucose, Bld: 86 mg/dL (ref 70–99)
Potassium: 4.9 mEq/L (ref 3.5–5.1)

## 2011-03-24 LAB — CBC WITH DIFFERENTIAL/PLATELET
Basophils Relative: 0.4 % (ref 0.0–3.0)
Eosinophils Relative: 3.9 % (ref 0.0–5.0)
HCT: 32.5 % — ABNORMAL LOW (ref 39.0–52.0)
Hemoglobin: 10.8 g/dL — ABNORMAL LOW (ref 13.0–17.0)
Lymphocytes Relative: 36.3 % (ref 12.0–46.0)
Monocytes Absolute: 0.6 10*3/uL (ref 0.1–1.0)
Neutro Abs: 3.1 10*3/uL (ref 1.4–7.7)
Neutrophils Relative %: 50.5 % (ref 43.0–77.0)
Platelets: 313 10*3/uL (ref 150.0–400.0)

## 2011-03-24 LAB — LIPID PANEL
Cholesterol: 188 mg/dL (ref 0–200)
Total CHOL/HDL Ratio: 4
Triglycerides: 118 mg/dL (ref 0.0–149.0)
VLDL: 23.6 mg/dL (ref 0.0–40.0)

## 2011-03-24 LAB — POCT URINALYSIS DIPSTICK
Bilirubin, UA: NEGATIVE
Nitrite, UA: NEGATIVE
Protein, UA: NEGATIVE
Spec Grav, UA: 1.03
Urobilinogen, UA: 0.2

## 2011-03-24 LAB — TSH: TSH: 1.88 u[IU]/mL (ref 0.35–5.50)

## 2011-03-24 LAB — HEPATIC FUNCTION PANEL: ALT: 95 U/L — ABNORMAL HIGH (ref 0–53)

## 2011-03-31 ENCOUNTER — Ambulatory Visit (INDEPENDENT_AMBULATORY_CARE_PROVIDER_SITE_OTHER): Payer: 59 | Admitting: Internal Medicine

## 2011-03-31 ENCOUNTER — Encounter: Payer: Self-pay | Admitting: Internal Medicine

## 2011-03-31 DIAGNOSIS — R7402 Elevation of levels of lactic acid dehydrogenase (LDH): Secondary | ICD-10-CM

## 2011-03-31 DIAGNOSIS — Z0000 Encounter for general adult medical examination without abnormal findings: Secondary | ICD-10-CM

## 2011-03-31 DIAGNOSIS — E785 Hyperlipidemia, unspecified: Secondary | ICD-10-CM

## 2011-03-31 DIAGNOSIS — I10 Essential (primary) hypertension: Secondary | ICD-10-CM

## 2011-03-31 DIAGNOSIS — D649 Anemia, unspecified: Secondary | ICD-10-CM

## 2011-03-31 LAB — CBC WITH DIFFERENTIAL/PLATELET
Eosinophils Absolute: 0.2 10*3/uL (ref 0.0–0.7)
Hemoglobin: 11 g/dL — ABNORMAL LOW (ref 13.0–17.0)
Lymphocytes Relative: 26.5 % (ref 12.0–46.0)
Lymphs Abs: 2 10*3/uL (ref 0.7–4.0)
MCHC: 33.3 g/dL (ref 30.0–36.0)
Monocytes Relative: 8.6 % (ref 3.0–12.0)
Platelets: 306 10*3/uL (ref 150.0–400.0)
RBC: 4.1 Mil/uL — ABNORMAL LOW (ref 4.22–5.81)
RDW: 17.1 % — ABNORMAL HIGH (ref 11.5–14.6)

## 2011-03-31 LAB — IRON: Iron: 50 ug/dL (ref 42–165)

## 2011-03-31 NOTE — Progress Notes (Signed)
Subjective:    Patient ID: Christopher [name], male    DOB: December 04, 1975, 35 y.o.   MRN: [id number]  HPI Patient presents for complete physical examination. He notes that he has tried to donate blood and then rejected for low iron.  He has not noted any dark or tarry stools.  He is also followed for elevation of liver functions are felt to be combination of fatty liver as well as alcohol use. He is also followed for hypertension which is stable and controlled on medications and for hyperlipidemia.     Review of Systems  Constitutional: Negative for fever and fatigue.  HENT: Negative for hearing loss, congestion, neck pain and postnasal drip.   Eyes: Negative for discharge, redness and visual disturbance.  Respiratory: Negative for cough, shortness of breath and wheezing.   Cardiovascular: Negative for leg swelling.  Gastrointestinal: Negative for abdominal pain, constipation and abdominal distention.  Genitourinary: Negative for urgency and frequency.  Musculoskeletal: Negative for joint swelling and arthralgias.  Skin: Negative for color change and rash.  Neurological: Negative for weakness and light-headedness.  Hematological: Negative for adenopathy.  Psychiatric/Behavioral: Negative for behavioral problems.   Past Medical History  Diagnosis Date  . GERD (gastroesophageal reflux disease)   . Hyperlipidemia   . IBS (irritable bowel syndrome)   . Hypertension    Past Surgical History  Procedure Date  . Right wrist  surgery with plate     reports that he has never smoked. He does not have any smokeless tobacco history on file. He reports that he drinks alcohol. He reports that he does not use illicit drugs. family history includes Hyperlipidemia in his mother and Hypertension in his father and mother. No Known Allergies     Objective:   Physical Exam  Constitutional: He is oriented to person, place, and time. He appears well-developed and well-nourished.  HENT:  Head:  Normocephalic and atraumatic.  Eyes: Conjunctivae are normal. Pupils are equal, round, and reactive to light.  Neck: Normal range of motion. Neck supple.  Cardiovascular: Normal rate and regular rhythm.   Pulmonary/Chest: Effort normal and breath sounds normal.  Abdominal: Soft. Bowel sounds are normal.  Genitourinary: Rectum normal and prostate normal.  Musculoskeletal: Normal range of motion.  Neurological: He is alert and oriented to person, place, and time.  Skin: Skin is warm and dry.  Psychiatric: He has a normal mood and affect.          Assessment & Plan:   Patient presents for yearly preventative medicine examination.   all immunizations and health maintenance protocols were reviewed with the patient and they are up to date with these protocols.   screening laboratory values were reviewed with the patient including screening of hyperlipidemia PSA renal function and hepatic function.   There medications past medical history social history problem list and allergies were reviewed in detail.    Goals were established with regard to weight loss exercise diet in compliance with medications  Patient's liver function elevations has actually worsened we discussed the impact of alcohol use even in the episodic amounts if it is excessive and discussed ways of moderate consent He will try for better control this issue and will recheck liver functions in 3 months.  He has iron deficiency anemia which is a new finding he does donate blood about every 2 months and this could be cumulative but the alcohol use could cause gastritis which resulted in blood loss therefore we'll repeat the CBC and iron level replace  his iron place him on Nexium 40 mg by mouth twice a day a followup again will be held in one month.  His blood pressure is stable

## 2011-04-06 ENCOUNTER — Telehealth: Payer: Self-pay | Admitting: *Deleted

## 2011-04-06 NOTE — Telephone Encounter (Signed)
His iron level is very low normal his hemoglobin and hematocrit are still low I would suggest he take iron supplementation for at least 30 days.

## 2011-04-06 NOTE — Telephone Encounter (Signed)
Pt would like his lab results  

## 2011-04-06 NOTE — Telephone Encounter (Signed)
Pt. Notified.

## 2011-04-11 NOTE — Op Note (Signed)
Christopher [name], Christopher [name]                ACCOUNT NO.:  [id number]   MEDICAL RECORD NO.:  [id number]          PATIENT TYPE:  AMB   LOCATION:  DSC                          FACILITY:  [facility]   PHYSICIAN:  Artist Pais. Weingold, M.D.DATE OF BIRTH:  14-Jun-1976   DATE OF PROCEDURE:  10/30/2007  DATE OF DISCHARGE:                               OPERATIVE REPORT   PREOPERATIVE DIAGNOSIS:  Displaced intra-articular fracture, right  distal radius, with carpal tunnel symptoms.   POSTOPERATIVE DIAGNOSIS:  Displaced intra-articular fracture, right  distal radius, with carpal tunnel symptoms.   PROCEDURES:  Open reduction and internal fixation of displaced intra-  articular fracture, right distal radius, using DVR plate and screws.  1. Carpal tunnel release on the right through separate incision.   SURGEON:  Artist Pais. Mina Marble, M.D.   ASSISTANT:  None.   ANESTHESIA:  General.   TOURNIQUET TIME:  1 hour 15 minutes.   No complications.  No drains.   OPERATIVE REPORT:  The patient was taken to the operating suite.  After  the induction of adequate general anesthesia, the right upper extremity  was prepped and draped in a sterile fashion.  An Esmarch was used to  exsanguinate the limb and the tourniquet was inflated to 250 mmHg.  At  this point in time an incision was made over the palpable border of the  flexor carpi radialis tendon, extended distally in Brunner fashion  across the distal forearm and wrist crease.  The skin was incised.  The  FCR tendon was identified.  The sheath was incised.  It was retracted in  the midline, the radial artery to the lateral side.  The interval  between these two was developed down to the level of the pronator  quadratus.  There was significant destruction of the pronator quadratus  at the fracture site with a complex intra-articular fracture that was  identified.  The pronator quadratus was subperiosteally stripped off the  distal radius, exposing the fracture  site.  This include release of the  brachioradialis in the first dorsal compartment along the radial styloid  fragment.  Once this was done, manipulation manually and under  __________ and fluoroscopic guidance aided in reduction of the fracture.  Once this was achieved, the DVR plate was fastened to the lower aspect  of the distal radius through the slotted hole until the appropriate  placement was verified fluoroscopically.  Once this was done, the  remaining cortical screws were placed proximally and the smooth pegs  distally.  Intraoperative fluoroscopy revealed a near-anatomic reduction  both in the AP, lateral and oblique view.  The wound was then irrigated.  A separate incision was made in the palm, where the palmar fascia was  identified, split with a 15 blade.  The median nerve was identified,  protected with a Therapist, nutritional.  The transverse carpal ligament was  divided, the median nerve was released of all pressure points.  These  wounds were both then irrigated  and loosely closed in layers of 0Vicryl for the pronator quadratus and 4-  0 Vicryl Rapide for both the carpal  tunnel and the operative fixation  incisions.  Xeroform, 4x4s and a volar splint were applied.  The patient  tolerated both procedures well and went to the recovery in stable  fashion.      [name], M.D.  Electronically Signed     [name]  D:  10/30/2007  T:  10/31/2007  Job:  [id number]

## 2011-04-11 NOTE — Consult Note (Signed)
NAMEJOSEPHANTHONY, [name] NO.:  [id number]   MEDICAL RECORD NO.:  [id number]          PATIENT TYPE:  EMS   LOCATION:  MAJO                         FACILITY:  [facility]   PHYSICIAN:  Artist Pais. Weingold, M.D.DATE OF BIRTH:  29-Nov-1975   DATE OF CONSULTATION:  10/28/2007  DATE OF DISCHARGE:  10/28/2007                                 CONSULTATION   REQUESTING PHYSICIAN:  Dr. Radford Pax.   REASON FOR CONSULTATION:  Christopher [name] is a 35 year old right-hand-  dominant male who was putting Christmas lights up on his roof and fell  off a ladder, presents today with a obvious injury to his right upper  extremity, with deformity and discomfort in the hand and wrist area.  He  is 31.   ALLERGIES:  NO KNOWN DRUG ALLERGIES.   CURRENT MEDICATIONS:  No current medications.  No recent  hospitalizations or surgery.   FAMILY HISTORY:  Noncontributory.   SOCIAL HISTORY:  Noncontributory.   EXAMINATION:  GENERAL:  A well-nourished male, pleasant, alert and  oriented x3.  HAND AND WRIST:  Examination of his hand and wrist on the right:  He has  an obvious deformity with pain and swelling and dorsal displacement of  the hand upon the wrist.  His injury is closed.  He complains of  numbness and tingling in the median distribution intermittently, which  is getting worse.  SKIN:  Intact.  X-rays show comminuted intra-articular fracture with  significant dorsal displacement.  He was given a 2% plain lidocaine  hematoma block, placed in a finger trap traction.  Closed reduction was  performed.  Post-reduction films showed adequate reduction; however, I  discussed with Mr. Tschantz and his wife.  Because he is right-hand  dominant, I would recommend open reduction internal fixation.  He was  discharged in the emergency department with Percocet for pain.  He will  follow up in my office tomorrow.  We will schedule him electively for  ORIF and carpal tunnel release on the right for this Wednesday,  December  3rd.  He is to call me immediately if there is any signs of compartment  syndrome, which was discussed in great detail.      [name], M.D.  Electronically Signed     [name]  D:  10/28/2007  T:  10/28/2007  Job:  [id number]

## 2011-04-13 ENCOUNTER — Telehealth: Payer: Self-pay | Admitting: *Deleted

## 2011-04-13 ENCOUNTER — Other Ambulatory Visit: Payer: 59

## 2011-04-27 ENCOUNTER — Other Ambulatory Visit (INDEPENDENT_AMBULATORY_CARE_PROVIDER_SITE_OTHER): Payer: 59

## 2011-04-27 DIAGNOSIS — K589 Irritable bowel syndrome without diarrhea: Secondary | ICD-10-CM

## 2011-04-27 LAB — HEMOCCULT GUIAC POC 1CARD (OFFICE)

## 2011-07-03 ENCOUNTER — Encounter: Payer: Self-pay | Admitting: Internal Medicine

## 2011-07-03 ENCOUNTER — Ambulatory Visit (INDEPENDENT_AMBULATORY_CARE_PROVIDER_SITE_OTHER): Payer: 59 | Admitting: Internal Medicine

## 2011-07-03 DIAGNOSIS — D509 Iron deficiency anemia, unspecified: Secondary | ICD-10-CM

## 2011-07-03 DIAGNOSIS — K219 Gastro-esophageal reflux disease without esophagitis: Secondary | ICD-10-CM

## 2011-07-03 DIAGNOSIS — R748 Abnormal levels of other serum enzymes: Secondary | ICD-10-CM

## 2011-07-03 DIAGNOSIS — R7402 Elevation of levels of lactic acid dehydrogenase (LDH): Secondary | ICD-10-CM

## 2011-07-03 LAB — HEPATIC FUNCTION PANEL
ALT: 75 U/L — ABNORMAL HIGH (ref 0–53)
Albumin: 4.3 g/dL (ref 3.5–5.2)
Bilirubin, Direct: 0.1 mg/dL (ref 0.0–0.3)
Total Protein: 7.1 g/dL (ref 6.0–8.3)

## 2011-07-03 LAB — CBC WITH DIFFERENTIAL/PLATELET
Basophils Relative: 0.7 % (ref 0.0–3.0)
Eosinophils Absolute: 0.1 10*3/uL (ref 0.0–0.7)
Eosinophils Relative: 2.9 % (ref 0.0–5.0)
HCT: 38.1 % — ABNORMAL LOW (ref 39.0–52.0)
Hemoglobin: 12.9 g/dL — ABNORMAL LOW (ref 13.0–17.0)
MCHC: 33.8 g/dL (ref 30.0–36.0)
MCV: 86.8 fl (ref 78.0–100.0)
Monocytes Absolute: 0.4 10*3/uL (ref 0.1–1.0)
Neutro Abs: 2.4 10*3/uL (ref 1.4–7.7)
RBC: 4.39 Mil/uL (ref 4.22–5.81)
WBC: 4.5 10*3/uL (ref 4.5–10.5)

## 2011-07-03 NOTE — Progress Notes (Signed)
  Subjective:    Patient ID: Christopher [name], male    DOB: 1975/12/06, 35 y.o.   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  Given his family history fatty liver is a definite chronic diagnosis that is at high risk to this patient.  Excessive periodic alcohol use such as 6-8 beers on a Saturday or Sunday night exacerbates this problem.  The patient has no sequela I of alcoholism such as varices or history of hemoptysis but does have chronic recurrent gastritis and reflux which I believe are secondary to this pattern  Review of Systems  Constitutional: Negative for fever and fatigue.  HENT: Negative for hearing loss, congestion, neck pain and postnasal drip.   Eyes: Negative for discharge, redness and visual disturbance.  Respiratory: Negative for cough, shortness of breath and wheezing.   Cardiovascular: Negative for leg swelling.  Gastrointestinal: Negative for abdominal pain, constipation and abdominal distention.  Genitourinary: Negative for urgency and frequency.  Musculoskeletal: Negative for joint swelling and arthralgias.  Skin: Negative for color change and rash.  Neurological: Negative for weakness and light-headedness.  Hematological: Negative for adenopathy.  Psychiatric/Behavioral: Negative for behavioral problems.   Past Medical History  Diagnosis Date  . GERD (gastroesophageal reflux disease)   . Hyperlipidemia   . IBS (irritable bowel syndrome)   . Hypertension    Past Surgical History  Procedure Date  . Right wrist  surgery with plate     reports that he has never smoked. He does not have any smokeless tobacco history on file. He reports that he drinks alcohol. He reports that he does not use illicit drugs. family history includes Hyperlipidemia in his mother and Hypertension in his father and mother. No Known Allergies     Objective:   Physical Exam  Nursing note reviewed. Constitutional: He appears well-developed and well-nourished.  HENT:  Head: Normocephalic and atraumatic.  Eyes: Conjunctivae are normal. Pupils are equal, round, and reactive to light.  Neck: Normal range of motion. Neck supple.  Cardiovascular: Normal rate and regular rhythm.   Pulmonary/Chest: Effort normal and breath sounds normal.  Abdominal: Soft. Bowel sounds are normal.          Assessment & Plan:  Patient has agreed to attempt to modify his alcohol intake.  Given his family history of fatty liver disease and his personal history of elevated liver functions as well as his age of 35 years liver disease may be inevitable if he does not turn this pattern around.  We stressed the essential importance to do this on his own and not an alcohol abstinence program for [facility] would be recommended.  His hypertension is stable his GERD is exacerbated by his drinking we discussed these things in

## 2011-08-01 ENCOUNTER — Encounter: Payer: Self-pay | Admitting: Internal Medicine

## 2011-08-07 ENCOUNTER — Other Ambulatory Visit: Payer: Self-pay | Admitting: Internal Medicine

## 2011-08-28 ENCOUNTER — Other Ambulatory Visit: Payer: Self-pay | Admitting: Internal Medicine

## 2011-09-04 LAB — BASIC METABOLIC PANEL
BUN: 10
CO2: 27
Calcium: 9.4
Chloride: 103
Creatinine, Ser: 1.13
GFR calc Af Amer: >60
GFR calc non Af Amer: >60
Glucose, Bld: 94
Potassium: 4.2
Sodium: 137

## 2011-09-04 LAB — POCT HEMOGLOBIN-HEMACUE
Hemoglobin: 14
Operator id: 112821

## 2011-10-25 ENCOUNTER — Other Ambulatory Visit (INDEPENDENT_AMBULATORY_CARE_PROVIDER_SITE_OTHER): Payer: 59

## 2011-10-25 DIAGNOSIS — D649 Anemia, unspecified: Secondary | ICD-10-CM

## 2011-10-25 LAB — HEPATIC FUNCTION PANEL
ALT: 77 U/L — ABNORMAL HIGH (ref 0–53)
Bilirubin, Direct: 0 mg/dL (ref 0.0–0.3)
Total Bilirubin: 0.7 mg/dL (ref 0.3–1.2)
Total Protein: 7.1 g/dL (ref 6.0–8.3)

## 2011-10-25 LAB — CBC WITH DIFFERENTIAL/PLATELET
Basophils Relative: 0.6 % (ref 0.0–3.0)
Eosinophils Relative: 2.8 % (ref 0.0–5.0)
Hemoglobin: 13.6 g/dL (ref 13.0–17.0)
Lymphocytes Relative: 34.7 % (ref 12.0–46.0)
Neutro Abs: 2.7 10*3/uL (ref 1.4–7.7)
Neutrophils Relative %: 53.4 % (ref 43.0–77.0)
RBC: 4.61 Mil/uL (ref 4.22–5.81)
WBC: 5.1 10*3/uL (ref 4.5–10.5)

## 2011-11-03 ENCOUNTER — Ambulatory Visit: Payer: 59 | Admitting: Internal Medicine

## 2011-11-06 ENCOUNTER — Encounter: Payer: Self-pay | Admitting: Internal Medicine

## 2011-11-06 ENCOUNTER — Ambulatory Visit (INDEPENDENT_AMBULATORY_CARE_PROVIDER_SITE_OTHER): Payer: 59 | Admitting: Internal Medicine

## 2011-11-06 VITALS — BP 130/84 | HR 72 | Temp 98.7°F | Resp 16 | Ht 72.0 in | Wt 214.0 lb

## 2011-11-06 DIAGNOSIS — D649 Anemia, unspecified: Secondary | ICD-10-CM

## 2011-11-06 DIAGNOSIS — J069 Acute upper respiratory infection, unspecified: Secondary | ICD-10-CM

## 2011-11-06 DIAGNOSIS — R945 Abnormal results of liver function studies: Secondary | ICD-10-CM

## 2011-11-06 DIAGNOSIS — J101 Influenza due to other identified influenza virus with other respiratory manifestations: Secondary | ICD-10-CM

## 2011-11-06 DIAGNOSIS — K769 Liver disease, unspecified: Secondary | ICD-10-CM

## 2011-11-06 MED ORDER — AZITHROMYCIN 250 MG PO TABS: ORAL_TABLET | ORAL | Status: DC

## 2011-11-06 MED ORDER — OSELTAMIVIR PHOSPHATE 75 MG PO CAPS: 75.0000 mg | ORAL_CAPSULE | Freq: Two times a day (BID) | ORAL | Status: AC

## 2011-11-06 MED ORDER — PSEUDOEPH-CHLORPHEN-HYDROCOD 60-4-5 MG/5ML PO SOLN: 5.0000 mL | Freq: Three times a day (TID) | ORAL | Status: DC

## 2011-11-06 NOTE — Patient Instructions (Signed)
The patient is instructed to continue all medications as prescribed. Schedule followup with check out clerk upon leaving the clinic  

## 2011-11-06 NOTE — Progress Notes (Signed)
Subjective:    Patient ID: Christopher [name], male    DOB: [birth year], 35 y.o.   MRN: [id number]  HPI Patient presents for multifactorial elevations of his liver functions are thought to be due to fatty liver as well as due to 20 increased alcohol consumption we discussed his use of beer and personally exceed more than 3 beers in a day.  He has persistent hyperlipidemia hypertension and esophageal reflux.  Today he has acute symptoms of fever chills sore throat and a nonproductive cough these began suddenly in the last 24-48 hours   Review of Systems  Constitutional: Negative for fever and fatigue.  HENT: Negative for hearing loss, congestion, neck pain and postnasal drip.   Eyes: Negative for discharge, redness and visual disturbance.  Respiratory: Negative for cough, shortness of breath and wheezing.   Cardiovascular: Negative for leg swelling.  Gastrointestinal: Negative for abdominal pain, constipation and abdominal distention.  Genitourinary: Negative for urgency and frequency.  Musculoskeletal: Negative for joint swelling and arthralgias.  Skin: Negative for color change and rash.  Neurological: Negative for weakness and light-headedness.  Hematological: Negative for adenopathy.  Psychiatric/Behavioral: Negative for behavioral problems.   Past Medical History  Diagnosis Date  . GERD (gastroesophageal reflux disease)   . Hyperlipidemia   . IBS (irritable bowel syndrome)   . Hypertension     History   Social History  . Marital Status: Married    Spouse Name: N/A    Number of Children: N/A  . Years of Education: N/A   Occupational History  . Not on file.   Social History Main Topics  . Smoking status: Never Smoker   . Smokeless tobacco: Not on file  . Alcohol Use: Yes  . Drug Use: No  . Sexually Active: Yes   Other Topics Concern  . Not on file   Social History Narrative  . No narrative on file    Past Surgical History  Procedure Date  . Right wrist   surgery with plate     Family History  Problem Relation Age of Onset  . Hypertension Mother   . Hyperlipidemia Mother   . Hypertension Father     No Known Allergies  Current Outpatient Prescriptions on File Prior to Visit  Medication Sig Dispense Refill  . clidinium-chlordiazePOXIDE (LIBRAX) 2.5-5 MG per capsule TAKE 1 CAPSULE BY MOUTH EVERY DAY  30 capsule  4  . Multiple Vitamin (MULTIVITAMIN) tablet Take 1 tablet by mouth daily.        Marland Kitchen olmesartan (BENICAR) 20 MG tablet Take 20 mg by mouth daily.        . pantoprazole (PROTONIX) 40 MG tablet TAKE 1 TABLET DAILY  90 tablet  1  . fenofibrate (TRICOR) 145 MG tablet Take 145 mg by mouth daily.          BP 130/84  Pulse 72  Temp 98.7 F (37.1 C)  Resp 16  Ht 6' (1.829 m)  Wt 214 lb (97.07 kg)  BMI 29.02 kg/m2       Objective:   Physical Exam  Nursing note and vitals reviewed. Constitutional: He appears well-developed and well-nourished.  HENT:  Head: Normocephalic and atraumatic.  Eyes: Conjunctivae are normal. Pupils are equal, round, and reactive to light.  Neck: Normal range of motion. Neck supple.  Cardiovascular: Normal rate and regular rhythm.   Pulmonary/Chest: Effort normal and breath sounds normal.  Abdominal: Soft. Bowel sounds are normal.          Assessment &  Plan:  Patient is urged to modify his alcohol consumption to no more than 3 drinks in any one day.  I have spent more than 30 minutes examining this patient face-to-face of which over half was spent in counseling   He will continue his cholesterol on medication for his fatty liver and work on weight loss.  His blood pressure stable on his current medications.  He probably has influenza AB2 Tamiflu 75 mg by mouth twice a day for 5 days and will be treated with symptomatic cough medicine

## 2011-11-07 ENCOUNTER — Telehealth: Payer: Self-pay | Admitting: Family Medicine

## 2011-11-07 NOTE — Telephone Encounter (Signed)
Triage Record Num: [id number] Operator: Sula Rumple Patient Name: Christopher [name] Call Date & Time: 11/06/2011 9:18:22PM Patient Phone: [phone] PCP: Darryll Capers Patient Gender: Male PCP Fax : [phone] Patient DOB: 1976/05/23 [facility] Name[facility] Reason for Call: Caller: Nuchem/Patient; PCP: Darryll Capers; CB#: [phone]; Call regarding Medication Issue; Medication(s): Tamiflu, Z-Pac; Pt calling on 11/06/11 states he was seen today/was advised he had Influenza A/was to have Tamiflu/ and Z-Pack ordered.sympotoms today/body aches/headache/cough/fever Rushie Chestnut [phone]/[facility] Dr Dan Humphreys may order Tamiflu 75 mg BID/Z pac/take as directed Protocol(s) Used: Medication Question Calls, No Triage (Adults) Recommended Outcome per Protocol: Call Provider Immediately Reason for Outcome: [1] Urgent new prescription or refill (likelihood of harm to patient if not taken) AND [2] triager unable to fill per unit policy Care Advice: ~

## 2011-11-28 HISTORY — PX: REFRACTIVE SURGERY: SHX103

## 2011-11-29 ENCOUNTER — Other Ambulatory Visit: Payer: Self-pay | Admitting: Internal Medicine

## 2011-12-12 ENCOUNTER — Other Ambulatory Visit: Payer: Self-pay | Admitting: *Deleted

## 2011-12-12 MED ORDER — CILIDINIUM-CHLORDIAZEPOXIDE 2.5-5 MG PO CAPS: 1.0000 | ORAL_CAPSULE | Freq: Every day | ORAL | Status: DC

## 2012-01-29 ENCOUNTER — Other Ambulatory Visit (INDEPENDENT_AMBULATORY_CARE_PROVIDER_SITE_OTHER): Payer: 59

## 2012-01-29 DIAGNOSIS — R945 Abnormal results of liver function studies: Secondary | ICD-10-CM

## 2012-01-29 DIAGNOSIS — K769 Liver disease, unspecified: Secondary | ICD-10-CM

## 2012-01-29 LAB — HEPATIC FUNCTION PANEL
ALT: 104 U/L — ABNORMAL HIGH (ref 0–53)
AST: 58 U/L — ABNORMAL HIGH (ref 0–37)
Albumin: 4.1 g/dL (ref 3.5–5.2)
Total Bilirubin: 0.5 mg/dL (ref 0.3–1.2)

## 2012-02-06 ENCOUNTER — Ambulatory Visit (INDEPENDENT_AMBULATORY_CARE_PROVIDER_SITE_OTHER): Payer: 59 | Admitting: Internal Medicine

## 2012-02-06 ENCOUNTER — Encounter: Payer: Self-pay | Admitting: Internal Medicine

## 2012-02-06 DIAGNOSIS — I10 Essential (primary) hypertension: Secondary | ICD-10-CM

## 2012-02-06 NOTE — Progress Notes (Signed)
Subjective:    Patient ID: Christopher [name], male    DOB: January 04, 1976, 36 y.o.   MRN: [id number]  HPI This is a 35 year old male who has a fatty infiltration of his liver and hyperlipidemia as well as hypertension.  He has gained weight recently and has worsening liver functions in the past we have discussed his alcohol use which is episodic beer but I believe after a careful interview today what may play a good role his issues of sugar-containing soft drinks and iced tea.  This may also be the cause of his hypertriglyceridemia and is creating a fatty infiltration of the liver.  We went back to his flow sheets and saw when his weight was 200 his liver functions would seem to normalize when his weight is 218 his liver functions are increased.  We discussed the relationship between his weight sugar in his diet hypertriglyceridemia the use of alcohol and his fatty liver inflammation   Review of Systems  Constitutional: Negative for fever and fatigue.  HENT: Negative for hearing loss, congestion, neck pain and postnasal drip.   Eyes: Negative for discharge, redness and visual disturbance.  Respiratory: Negative for cough, shortness of breath and wheezing.   Cardiovascular: Negative for leg swelling.  Gastrointestinal: Negative for abdominal pain, constipation and abdominal distention.  Genitourinary: Negative for urgency and frequency.  Musculoskeletal: Negative for joint swelling and arthralgias.  Skin: Negative for color change and rash.  Neurological: Negative for weakness and light-headedness.  Hematological: Negative for adenopathy.  Psychiatric/Behavioral: Negative for behavioral problems.   Past Medical History  Diagnosis Date  . GERD (gastroesophageal reflux disease)   . Hyperlipidemia   . IBS (irritable bowel syndrome)   . Hypertension     History   Social History  . Marital Status: Married    Spouse Name: N/A    Number of Children: N/A  . Years of Education: N/A    Occupational History  . Not on file.   Social History Main Topics  . Smoking status: Never Smoker   . Smokeless tobacco: Not on file  . Alcohol Use: Yes  . Drug Use: No  . Sexually Active: Yes   Other Topics Concern  . Not on file   Social History Narrative  . No narrative on file    Past Surgical History  Procedure Date  . Right wrist  surgery with plate     Family History  Problem Relation Age of Onset  . Hypertension Mother   . Hyperlipidemia Mother   . Hypertension Father     No Known Allergies  Current Outpatient Prescriptions on File Prior to Visit  Medication Sig Dispense Refill  . BENICAR 20 MG tablet TAKE 1 TABLET DAILY  90 tablet  2  . clidinium-chlordiazePOXIDE (LIBRAX) 2.5-5 MG per capsule Take 1 capsule by mouth daily.  90 capsule  4  . fenofibrate (TRICOR) 145 MG tablet Take 145 mg by mouth daily.        . Multiple Vitamin (MULTIVITAMIN) tablet Take 1 tablet by mouth daily.        . pantoprazole (PROTONIX) 40 MG tablet TAKE 1 TABLET DAILY  90 tablet  1    BP 130/80  Pulse 72  Temp 98.3 F (36.8 C)  Resp 16  Ht 6' (1.829 m)  Wt 218 lb (98.884 kg)  BMI 29.57 kg/m2       Objective:   Physical Exam  Constitutional: He appears well-developed and well-nourished.  HENT:  Head: Normocephalic and atraumatic.  Eyes: Conjunctivae are normal. Pupils are equal, round, and reactive to light.  Neck: Normal range of motion. Neck supple.  Cardiovascular: Normal rate and regular rhythm.   Pulmonary/Chest: Effort normal and breath sounds normal.  Abdominal: Soft. Bowel sounds are normal.          Assessment & Plan:  Pre-spent over 30 minutes counseling the patient about his diet specifically the use of sugar-containing soft drinks and sweetened ice tea as a play a role in the elevation of his triglycerides and the fatty infiltration of his liver we also related this to his weight.   I have spent more than 30 minutes examining this patient  face-to-face of which over half was spent in counseling

## 2012-02-06 NOTE — Patient Instructions (Signed)
Cut sweet tea and sodas as well as set goal for weight loss of 10 pounds

## 2012-04-07 ENCOUNTER — Other Ambulatory Visit: Payer: Self-pay | Admitting: Internal Medicine

## 2012-05-02 ENCOUNTER — Other Ambulatory Visit (INDEPENDENT_AMBULATORY_CARE_PROVIDER_SITE_OTHER): Payer: 59

## 2012-05-02 DIAGNOSIS — I10 Essential (primary) hypertension: Secondary | ICD-10-CM

## 2012-05-02 LAB — HEPATIC FUNCTION PANEL
ALT: 42 U/L (ref 0–53)
Bilirubin, Direct: 0 mg/dL (ref 0.0–0.3)
Total Bilirubin: 0.8 mg/dL (ref 0.3–1.2)

## 2012-05-09 ENCOUNTER — Encounter: Payer: Self-pay | Admitting: Internal Medicine

## 2012-05-09 ENCOUNTER — Ambulatory Visit (INDEPENDENT_AMBULATORY_CARE_PROVIDER_SITE_OTHER): Payer: 59 | Admitting: Internal Medicine

## 2012-05-09 VITALS — BP 130/80 | HR 60 | Temp 98.2°F | Resp 14 | Ht 72.0 in | Wt 208.0 lb

## 2012-05-09 DIAGNOSIS — I10 Essential (primary) hypertension: Secondary | ICD-10-CM

## 2012-05-09 DIAGNOSIS — K7689 Other specified diseases of liver: Secondary | ICD-10-CM

## 2012-05-09 DIAGNOSIS — R7989 Other specified abnormal findings of blood chemistry: Secondary | ICD-10-CM

## 2012-05-09 DIAGNOSIS — K760 Fatty (change of) liver, not elsewhere classified: Secondary | ICD-10-CM

## 2012-05-09 DIAGNOSIS — A46 Erysipelas: Secondary | ICD-10-CM

## 2012-05-09 MED ORDER — MUPIROCIN CALCIUM 2 % EX CREA: TOPICAL_CREAM | Freq: Three times a day (TID) | CUTANEOUS | Status: AC

## 2012-05-09 MED ORDER — FENOFIBRATE 145 MG PO TABS: 145.0000 mg | ORAL_TABLET | Freq: Every day | ORAL | Status: DC

## 2012-05-09 NOTE — Patient Instructions (Addendum)
Mucinex fast Max liquid cold and sinus

## 2012-05-09 NOTE — Progress Notes (Signed)
Subjective:    Patient ID: Christopher [name], male    DOB: 1976-09-03, 36 y.o.   MRN: [id number]  HPI Patient's 36 year old male presents for followup of fatty liver and elevated liver enzymes.  He went on a weight loss and exercise program and modifying his diet and losing weight is resulted in complete normalization of his liver functions.  Ultrasound prior showed fatty infiltration no cirrhosis present at this time.     Review of Systems  Constitutional: Negative for fever and fatigue.  HENT: Negative for hearing loss, congestion, neck pain and postnasal drip.   Eyes: Negative for discharge, redness and visual disturbance.  Respiratory: Negative for cough, shortness of breath and wheezing.   Cardiovascular: Negative for leg swelling.  Gastrointestinal: Negative for abdominal pain, constipation and abdominal distention.  Genitourinary: Negative for urgency and frequency.  Musculoskeletal: Negative for joint swelling and arthralgias.  Skin: Negative for color change and rash.  Neurological: Negative for weakness and light-headedness.  Hematological: Negative for adenopathy.  Psychiatric/Behavioral: Negative for behavioral problems.   Past Medical History  Diagnosis Date  . GERD (gastroesophageal reflux disease)   . Hyperlipidemia   . IBS (irritable bowel syndrome)   . Hypertension     History   Social History  . Marital Status: Married    Spouse Name: N/A    Number of Children: N/A  . Years of Education: N/A   Occupational History  . Not on file.   Social History Main Topics  . Smoking status: Never Smoker   . Smokeless tobacco: Not on file  . Alcohol Use: Yes  . Drug Use: No  . Sexually Active: Yes   Other Topics Concern  . Not on file   Social History Narrative  . No narrative on file    Past Surgical History  Procedure Date  . Right wrist  surgery with plate     Family History  Problem Relation Age of Onset  . Hypertension Mother   .  Hyperlipidemia Mother   . Hypertension Father     No Known Allergies  Current Outpatient Prescriptions on File Prior to Visit  Medication Sig Dispense Refill  . BENICAR 20 MG tablet TAKE 1 TABLET DAILY  90 tablet  2  . clidinium-chlordiazePOXIDE (LIBRAX) 2.5-5 MG per capsule Take 1 capsule by mouth daily.  90 capsule  4  . pantoprazole (PROTONIX) 40 MG tablet TAKE 1 TABLET DAILY  90 tablet  0  . DISCONTD: fenofibrate (TRICOR) 145 MG tablet Take 145 mg by mouth daily.          BP 130/80  Pulse 60  Temp 98.2 F (36.8 C)  Resp 14  Ht 6' (1.829 m)  Wt 208 lb (94.348 kg)  BMI 28.21 kg/m2        Objective:   Physical Exam  Nursing note and vitals reviewed. Constitutional: He appears well-developed and well-nourished.  HENT:  Head: Normocephalic and atraumatic.  Eyes: Conjunctivae are normal. Pupils are equal, round, and reactive to light.  Neck: Normal range of motion. Neck supple.  Cardiovascular: Normal rate and regular rhythm.   Pulmonary/Chest: Effort normal and breath sounds normal.  Abdominal: Soft. Bowel sounds are normal.          Assessment & Plan:  Elevated liver functions Other liver functions have resolved with weight loss and dietary changes.  His also gastroesophageal reflux is improved significantly.  Blood pressure stable on current medications lipids are stable on current medications discuss continuing this diet program  to help preserve liver function long-term given family history and personal history

## 2012-08-06 ENCOUNTER — Other Ambulatory Visit (INDEPENDENT_AMBULATORY_CARE_PROVIDER_SITE_OTHER): Payer: 59

## 2012-08-06 DIAGNOSIS — R7989 Other specified abnormal findings of blood chemistry: Secondary | ICD-10-CM

## 2012-08-06 DIAGNOSIS — K7689 Other specified diseases of liver: Secondary | ICD-10-CM

## 2012-08-06 DIAGNOSIS — K760 Fatty (change of) liver, not elsewhere classified: Secondary | ICD-10-CM

## 2012-08-06 LAB — HEPATIC FUNCTION PANEL
ALT: 39 U/L (ref 0–53)
AST: 29 U/L (ref 0–37)
Albumin: 4.1 g/dL (ref 3.5–5.2)
Total Protein: 7 g/dL (ref 6.0–8.3)

## 2012-08-13 ENCOUNTER — Encounter: Payer: Self-pay | Admitting: Internal Medicine

## 2012-08-13 ENCOUNTER — Other Ambulatory Visit: Payer: Self-pay | Admitting: *Deleted

## 2012-08-13 ENCOUNTER — Ambulatory Visit (INDEPENDENT_AMBULATORY_CARE_PROVIDER_SITE_OTHER): Payer: 59 | Admitting: Internal Medicine

## 2012-08-13 VITALS — BP 130/80 | HR 72 | Temp 98.2°F | Resp 16 | Ht 72.0 in | Wt 208.0 lb

## 2012-08-13 DIAGNOSIS — I10 Essential (primary) hypertension: Secondary | ICD-10-CM

## 2012-08-13 DIAGNOSIS — K7689 Other specified diseases of liver: Secondary | ICD-10-CM

## 2012-08-13 DIAGNOSIS — Z23 Encounter for immunization: Secondary | ICD-10-CM

## 2012-08-13 DIAGNOSIS — E785 Hyperlipidemia, unspecified: Secondary | ICD-10-CM

## 2012-08-13 DIAGNOSIS — K219 Gastro-esophageal reflux disease without esophagitis: Secondary | ICD-10-CM

## 2012-08-13 MED ORDER — CELECOXIB 200 MG PO CAPS: 200.0000 mg | ORAL_CAPSULE | Freq: Every day | ORAL | Status: DC

## 2012-08-13 NOTE — Progress Notes (Signed)
  Subjective:    Patient ID: Christopher [name], male    DOB: 08-27-1976, 36 y.o.   MRN: [id number]  HPI  Patient followup for elevated liver enzymes that were attributed to fatty liver disease.  There is familial risk factors. Patient's weight has been stable after losing 10 pounds in liver functions remain normal.  He also has mild to moderate hyperlipidemia controlled hypertension and GERD  Review of Systems  Constitutional: Negative for fever and fatigue.  HENT: Negative for hearing loss, congestion, neck pain and postnasal drip.   Eyes: Negative for discharge, redness and visual disturbance.  Respiratory: Negative for cough, shortness of breath and wheezing.   Cardiovascular: Negative for leg swelling.  Gastrointestinal: Negative for abdominal pain, constipation and abdominal distention.  Genitourinary: Negative for urgency and frequency.  Musculoskeletal: Negative for joint swelling and arthralgias.  Skin: Negative for color change and rash.  Neurological: Negative for weakness and light-headedness.  Hematological: Negative for adenopathy.  Psychiatric/Behavioral: Negative for behavioral problems.   Past Medical History  Diagnosis Date  . GERD (gastroesophageal reflux disease)   . Hyperlipidemia   . IBS (irritable bowel syndrome)   . Hypertension     History   Social History  . Marital Status: Married    Spouse Name: N/A    Number of Children: N/A  . Years of Education: N/A   Occupational History  . Not on file.   Social History Main Topics  . Smoking status: Never Smoker   . Smokeless tobacco: Not on file  . Alcohol Use: Yes  . Drug Use: No  . Sexually Active: Yes   Other Topics Concern  . Not on file   Social History Narrative  . No narrative on file    Past Surgical History  Procedure Date  . Right wrist  surgery with plate     Family History  Problem Relation Age of Onset  . Hypertension Mother   . Hyperlipidemia Mother   . Hypertension  Father     No Known Allergies  Current Outpatient Prescriptions on File Prior to Visit  Medication Sig Dispense Refill  . BENICAR 20 MG tablet TAKE 1 TABLET DAILY  90 tablet  2  . clidinium-chlordiazePOXIDE (LIBRAX) 2.5-5 MG per capsule Take 1 capsule by mouth daily.  90 capsule  4  . fenofibrate (TRICOR) 145 MG tablet Take 1 tablet (145 mg total) by mouth daily.  90 tablet  3  . pantoprazole (PROTONIX) 40 MG tablet TAKE 1 TABLET DAILY  90 tablet  0    BP 130/80  Pulse 72  Temp 98.2 F (36.8 C)  Resp 16  Ht 6' (1.829 m)  Wt 208 lb (94.348 kg)  BMI 28.21 kg/m2       Objective:   Physical Exam  Nursing note and vitals reviewed. Constitutional: He appears well-developed and well-nourished.  HENT:  Head: Normocephalic and atraumatic.  Eyes: Conjunctivae normal are normal. Pupils are equal, round, and reactive to light.  Neck: Normal range of motion. Neck supple.  Cardiovascular: Normal rate and regular rhythm.   Pulmonary/Chest: Effort normal and breath sounds normal.  Abdominal: Soft. Bowel sounds are normal.          Assessment & Plan:  Exercise and moderation Keep weight stable  Weight goal of 195  Blood pressure stable Improved liver dz Stable on benicar   Use of celebrex 200 for acute pain for the back..... Aleve for chronic pain

## 2012-08-13 NOTE — Patient Instructions (Addendum)
The patient is instructed to continue all medications as prescribed. Schedule followup with check out clerk upon leaving the clinic  

## 2013-02-04 ENCOUNTER — Other Ambulatory Visit: Payer: 59

## 2013-02-11 ENCOUNTER — Encounter: Payer: 59 | Admitting: Internal Medicine

## 2013-04-24 ENCOUNTER — Other Ambulatory Visit: Payer: Self-pay | Admitting: *Deleted

## 2013-04-28 ENCOUNTER — Other Ambulatory Visit (INDEPENDENT_AMBULATORY_CARE_PROVIDER_SITE_OTHER): Payer: 59

## 2013-04-28 DIAGNOSIS — Z0000 Encounter for general adult medical examination without abnormal findings: Secondary | ICD-10-CM

## 2013-04-28 LAB — POCT URINALYSIS DIPSTICK
Ketones, UA: NEGATIVE
Protein, UA: NEGATIVE
Spec Grav, UA: 1.02
pH, UA: 6.5

## 2013-04-28 LAB — LIPID PANEL
Cholesterol: 162 mg/dL (ref 0–200)
HDL: 35.4 mg/dL — ABNORMAL LOW (ref >39.00)
LDL Cholesterol: 99 mg/dL (ref 0–99)
VLDL: 28 mg/dL (ref 0.0–40.0)

## 2013-04-28 LAB — CBC WITH DIFFERENTIAL/PLATELET
Eosinophils Relative: 3 % (ref 0.0–5.0)
Lymphocytes Relative: 33.3 % (ref 12.0–46.0)
MCV: 88.1 fl (ref 78.0–100.0)
Monocytes Absolute: 0.4 10*3/uL (ref 0.1–1.0)
Monocytes Relative: 9.4 % (ref 3.0–12.0)
Neutrophils Relative %: 53.6 % (ref 43.0–77.0)
Platelets: 285 10*3/uL (ref 150.0–400.0)
RBC: 4.58 Mil/uL (ref 4.22–5.81)
WBC: 4.7 10*3/uL (ref 4.5–10.5)

## 2013-04-28 LAB — BASIC METABOLIC PANEL
Chloride: 106 mEq/L (ref 96–112)
GFR: 74.31 mL/min (ref >60.00)
Glucose, Bld: 92 mg/dL (ref 70–99)
Potassium: 4 mEq/L (ref 3.5–5.1)
Sodium: 139 mEq/L (ref 135–145)

## 2013-04-28 LAB — HEPATIC FUNCTION PANEL
AST: 37 U/L (ref 0–37)
Albumin: 3.9 g/dL (ref 3.5–5.2)
Alkaline Phosphatase: 33 U/L — ABNORMAL LOW (ref 39–117)
Total Protein: 6.9 g/dL (ref 6.0–8.3)

## 2013-05-05 ENCOUNTER — Encounter: Payer: Self-pay | Admitting: Internal Medicine

## 2013-05-05 ENCOUNTER — Ambulatory Visit (INDEPENDENT_AMBULATORY_CARE_PROVIDER_SITE_OTHER): Payer: 59 | Admitting: Internal Medicine

## 2013-05-05 VITALS — BP 140/90 | HR 76 | Temp 98.2°F | Resp 16 | Ht 72.0 in | Wt 212.0 lb

## 2013-05-05 DIAGNOSIS — F6389 Other impulse disorders: Secondary | ICD-10-CM

## 2013-05-05 DIAGNOSIS — F633 Trichotillomania: Secondary | ICD-10-CM

## 2013-05-05 DIAGNOSIS — R220 Localized swelling, mass and lump, head: Secondary | ICD-10-CM

## 2013-05-05 DIAGNOSIS — Z0000 Encounter for general adult medical examination without abnormal findings: Secondary | ICD-10-CM

## 2013-05-05 DIAGNOSIS — K118 Other diseases of salivary glands: Secondary | ICD-10-CM

## 2013-05-05 MED ORDER — FLUVOXAMINE MALEATE 50 MG PO TABS: 50.0000 mg | ORAL_TABLET | Freq: Every day | ORAL | Status: DC

## 2013-05-05 NOTE — Progress Notes (Signed)
Subjective:    Patient ID: Christopher [name], male    DOB: [birth year], 37 y.o.   MRN: [id number]  HPI CPX Under stress due to custody issues    Review of Systems  Constitutional: Negative for fever and fatigue.  HENT: Negative for hearing loss, congestion, neck pain and postnasal drip.   Eyes: Negative for discharge, redness and visual disturbance.  Respiratory: Negative for cough, shortness of breath and wheezing.   Cardiovascular: Negative for leg swelling.  Gastrointestinal: Negative for abdominal pain, constipation and abdominal distention.  Genitourinary: Negative for urgency and frequency.  Musculoskeletal: Negative for joint swelling and arthralgias.  Skin: Negative for color change and rash.  Neurological: Negative for weakness and light-headedness.  Hematological: Negative for adenopathy.  Psychiatric/Behavioral: Negative for behavioral problems.   Past Medical History  Diagnosis Date  . GERD (gastroesophageal reflux disease)   . Hyperlipidemia   . IBS (irritable bowel syndrome)   . Hypertension     History   Social History  . Marital Status: Married    Spouse Name: N/A    Number of Children: N/A  . Years of Education: N/A   Occupational History  . Not on file.   Social History Main Topics  . Smoking status: Never Smoker   . Smokeless tobacco: Not on file  . Alcohol Use: Yes  . Drug Use: No  . Sexually Active: Yes   Other Topics Concern  . Not on file   Social History Narrative  . No narrative on file    Past Surgical History  Procedure Laterality Date  . Right wrist  surgery with plate      Family History  Problem Relation Age of Onset  . Hypertension Mother   . Hyperlipidemia Mother   . Hypertension Father     No Known Allergies  Current Outpatient Prescriptions on File Prior to Visit  Medication Sig Dispense Refill  . BENICAR 20 MG tablet TAKE 1 TABLET DAILY  90 tablet  2  . clidinium-chlordiazePOXIDE (LIBRAX) 2.5-5 MG per capsule  Take 1 capsule by mouth daily.  90 capsule  4  . fenofibrate (TRICOR) 145 MG tablet Take 1 tablet (145 mg total) by mouth daily.  90 tablet  3  . pantoprazole (PROTONIX) 40 MG tablet TAKE 1 TABLET DAILY  90 tablet  0   No current facility-administered medications on file prior to visit.    BP 140/90  Pulse 76  Temp(Src) 98.2 F (36.8 C)  Resp 16  Ht 6' (1.829 m)  Wt 212 lb (96.163 kg)  BMI 28.75 kg/m2       Objective:   Physical Exam  Nursing note and vitals reviewed. Constitutional: He appears well-developed and well-nourished.  HENT:  Head: Normocephalic and atraumatic.  Eyes: Conjunctivae are normal. Pupils are equal, round, and reactive to light.  Neck: Normal range of motion. Neck supple.  Cardiovascular: Normal rate and regular rhythm.   Pulmonary/Chest: Effort normal and breath sounds normal.  Abdominal: Soft. Bowel sounds are normal.  Skin:  Multiple excoriations arms and scaring noted          Assessment & Plan:   Patient presents for yearly preventative medicine examination.   all immunizations and health maintenance protocols were reviewed with the patient and they are up to date with these protocols.   screening laboratory values were reviewed with the patient including screening of hyperlipidemia PSA renal function and hepatic function.   There medications past medical history social history problem list and allergies  were reviewed in detail.   Goals were established with regard to weight loss exercise diet in compliance with medications   Stress management Picking skin noted OCD  and use of paxil or luvox Dicussed liver elevation

## 2013-05-07 ENCOUNTER — Ambulatory Visit
Admission: RE | Admit: 2013-05-07 | Discharge: 2013-05-07 | Disposition: A | Discharge status: Home / Self Care | Payer: 59 | Source: Ambulatory Visit | Attending: Internal Medicine | Admitting: Internal Medicine

## 2013-05-07 DIAGNOSIS — K118 Other diseases of salivary glands: Secondary | ICD-10-CM

## 2013-05-08 ENCOUNTER — Encounter: Payer: Self-pay | Admitting: Internal Medicine

## 2013-05-09 ENCOUNTER — Other Ambulatory Visit: Payer: Self-pay | Admitting: *Deleted

## 2013-05-09 DIAGNOSIS — R221 Localized swelling, mass and lump, neck: Secondary | ICD-10-CM

## 2013-05-13 ENCOUNTER — Other Ambulatory Visit: Payer: Self-pay | Admitting: *Deleted

## 2013-05-13 MED ORDER — FENOFIBRATE 145 MG PO TABS: 145.0000 mg | ORAL_TABLET | Freq: Every day | ORAL | Status: DC

## 2013-05-13 MED ORDER — OLMESARTAN MEDOXOMIL 20 MG PO TABS: ORAL_TABLET | ORAL | Status: DC

## 2013-05-13 MED ORDER — CILIDINIUM-CHLORDIAZEPOXIDE 2.5-5 MG PO CAPS: 1.0000 | ORAL_CAPSULE | Freq: Every day | ORAL | Status: DC

## 2013-05-14 ENCOUNTER — Ambulatory Visit (INDEPENDENT_AMBULATORY_CARE_PROVIDER_SITE_OTHER)
Admission: RE | Admit: 2013-05-14 | Discharge: 2013-05-14 | Disposition: A | Discharge status: Home / Self Care | Payer: 59 | Source: Ambulatory Visit | Attending: Internal Medicine | Admitting: Internal Medicine

## 2013-05-14 DIAGNOSIS — R221 Localized swelling, mass and lump, neck: Secondary | ICD-10-CM

## 2013-05-14 DIAGNOSIS — R220 Localized swelling, mass and lump, head: Secondary | ICD-10-CM

## 2013-05-14 MED ORDER — IOHEXOL 300 MG/ML  SOLN
76.0000 mL | Freq: Once | INTRAMUSCULAR | Status: AC | PRN
  Administered 2013-05-14: 76 mL via INTRAVENOUS

## 2013-07-15 ENCOUNTER — Encounter: Payer: Self-pay | Admitting: Internal Medicine

## 2013-07-15 ENCOUNTER — Ambulatory Visit (INDEPENDENT_AMBULATORY_CARE_PROVIDER_SITE_OTHER): Payer: 59 | Admitting: Internal Medicine

## 2013-07-15 VITALS — BP 146/90 | HR 70 | Temp 97.8°F | Resp 20 | Wt 214.0 lb

## 2013-07-15 DIAGNOSIS — E785 Hyperlipidemia, unspecified: Secondary | ICD-10-CM

## 2013-07-15 DIAGNOSIS — L02619 Cutaneous abscess of unspecified foot: Secondary | ICD-10-CM

## 2013-07-15 DIAGNOSIS — I10 Essential (primary) hypertension: Secondary | ICD-10-CM

## 2013-07-15 MED ORDER — AMOXICILLIN-POT CLAVULANATE 875-125 MG PO TABS: 1.0000 | ORAL_TABLET | Freq: Two times a day (BID) | ORAL | Status: DC

## 2013-07-15 NOTE — Patient Instructions (Addendum)
Take your antibiotic as prescribed until ALL of it is gone, but stop if you develop a rash, swelling, or any side effects of the medication.  Contact our office as soon as possible if  there are side effects of the medication.   Cellulitis Cellulitis is an infection of the skin and the tissue under the skin. The infected area is usually red and tender. This happens most often in the arms and lower legs. HOME CARE   Take your antibiotic medicine as told. Finish the medicine even if you start to feel better.  Keep the infected arm or leg raised (elevated).  Put a warm cloth on the area up to 4 times per day.  Only take medicines as told by your doctor.  Keep all doctor visits as told. GET HELP RIGHT AWAY IF:   You have a fever.  You feel very sleepy.  You throw up (vomit) or have watery poop (diarrhea).  You feel sick and have muscle aches and pains.  You see red streaks on the skin coming from the infected area.  Your red area gets bigger or turns a dark color.  Your bone or joint under the infected area is painful after the skin heals.  Your infection comes back in the same area or different area.  You have a puffy (swollen) bump in the infected area.  You have new symptoms. MAKE SURE YOU:   Understand these instructions.  Will watch your condition.  Will get help right away if you are not doing well or get worse. Document Released: 05/01/2008 Document Revised: 05/14/2012 Document Reviewed: 01/29/2012 [employer] Patient Information 2014 [employer].  

## 2013-07-15 NOTE — Progress Notes (Signed)
  Subjective:    Patient ID: Christopher [name], male    DOB: Apr 26, 1976, 37 y.o.   MRN: [id number]  HPI  37 year old patient who has a two-month history of intermittent pain and swelling involving the dorsal aspect of his right second toe. More recently has developed a small blister that he opened  yesterday. He has noticed some redness and discomfort and some soft tissue swelling  Medical problems include hypertension and dyslipidemia    Review of Systems  Skin: Positive for wound.       Objective:   Physical Exam  Constitutional: He appears well-developed and well-nourished. No distress.  Blood pressure 140/90  Skin:  Tiny 2 mm ulceration with surrounding soft tissue swelling and erythema involving the dorsal aspect of the proximal right second toe          Assessment & Plan:   Small blister and secondary cellulitis involving the right second toe. He's had intermittent difficulties for the past 2 months. There is no fluctuance. Will recommend  soaks 3-4 times daily and placed on antibiotic therapy

## 2013-08-20 ENCOUNTER — Ambulatory Visit: Payer: 59 | Admitting: Internal Medicine

## 2013-09-08 ENCOUNTER — Telehealth (HOSPITAL_COMMUNITY): Payer: Self-pay | Admitting: *Deleted

## 2013-09-15 ENCOUNTER — Other Ambulatory Visit: Payer: Self-pay | Admitting: *Deleted

## 2013-09-15 ENCOUNTER — Encounter: Payer: Self-pay | Admitting: Internal Medicine

## 2013-09-15 MED ORDER — DOXYCYCLINE HYCLATE 100 MG PO TABS: 100.0000 mg | ORAL_TABLET | Freq: Two times a day (BID) | ORAL | Status: DC

## 2013-10-02 ENCOUNTER — Other Ambulatory Visit: Payer: Self-pay

## 2013-11-27 HISTORY — PX: CYST EXCISION: SHX5701

## 2013-12-01 ENCOUNTER — Other Ambulatory Visit: Payer: Self-pay | Admitting: Internal Medicine

## 2013-12-17 ENCOUNTER — Ambulatory Visit: Payer: 59 | Admitting: Internal Medicine

## 2013-12-24 ENCOUNTER — Other Ambulatory Visit: Payer: Self-pay | Admitting: *Deleted

## 2013-12-24 ENCOUNTER — Ambulatory Visit (INDEPENDENT_AMBULATORY_CARE_PROVIDER_SITE_OTHER): Payer: 59 | Admitting: Internal Medicine

## 2013-12-24 ENCOUNTER — Encounter: Payer: Self-pay | Admitting: Internal Medicine

## 2013-12-24 VITALS — BP 170/100 | HR 76 | Temp 98.2°F | Resp 16 | Ht 72.0 in | Wt 214.0 lb

## 2013-12-24 DIAGNOSIS — G4700 Insomnia, unspecified: Secondary | ICD-10-CM

## 2013-12-24 DIAGNOSIS — K7689 Other specified diseases of liver: Secondary | ICD-10-CM

## 2013-12-24 DIAGNOSIS — I10 Essential (primary) hypertension: Secondary | ICD-10-CM

## 2013-12-24 MED ORDER — TEMAZEPAM 15 MG PO CAPS: 15.0000 mg | ORAL_CAPSULE | Freq: Every evening | ORAL | Status: DC | PRN

## 2013-12-24 MED ORDER — OLMESARTAN MEDOXOMIL 40 MG PO TABS: ORAL_TABLET | ORAL | Status: DC

## 2013-12-24 NOTE — Progress Notes (Signed)
   Subjective:    Patient ID: Christopher [name], male    DOB: 1976-01-17, 38 y.o.   MRN: [id number]  HPI Non adherence with the tricor benicar 20 for HTNnot sleeping well     Review of Systems  Constitutional: Negative for fever and fatigue.  HENT: Negative for congestion, hearing loss and postnasal drip.   Eyes: Negative for discharge, redness and visual disturbance.  Respiratory: Negative for cough, shortness of breath and wheezing.   Cardiovascular: Negative for leg swelling.  Gastrointestinal: Negative for abdominal pain, constipation and abdominal distention.  Genitourinary: Negative for urgency and frequency.  Musculoskeletal: Negative for arthralgias, joint swelling and neck pain.  Skin: Negative for color change and rash.  Neurological: Negative for weakness and light-headedness.  Hematological: Negative for adenopathy.  Psychiatric/Behavioral: Negative for behavioral problems.   Past Medical History  Diagnosis Date  . GERD (gastroesophageal reflux disease)   . Hyperlipidemia   . IBS (irritable bowel syndrome)   . Hypertension     History   Social History  . Marital Status: Married    Spouse Name: N/A    Number of Children: N/A  . Years of Education: N/A   Occupational History  . Not on file.   Social History Main Topics  . Smoking status: Never Smoker   . Smokeless tobacco: Not on file  . Alcohol Use: Yes  . Drug Use: No  . Sexual Activity: Yes   Other Topics Concern  . Not on file   Social History Narrative  . No narrative on file    Past Surgical History  Procedure Laterality Date  . Right wrist  surgery with plate      Family History  Problem Relation Age of Onset  . Hypertension Mother   . Hyperlipidemia Mother   . Hypertension Father     No Known Allergies  Current Outpatient Prescriptions on File Prior to Visit  Medication Sig Dispense Refill  . clidinium-chlordiazePOXIDE (LIBRAX) 2.5-5 MG per capsule Take 1 capsule by mouth  daily.  90 capsule  3  . olmesartan (BENICAR) 20 MG tablet TAKE 1 TABLET DAILY  90 tablet  3  . pantoprazole (PROTONIX) 40 MG tablet Take 1 tablet by mouth  every day  90 tablet  3  . fenofibrate (TRICOR) 145 MG tablet Take 1 tablet (145 mg total) by mouth daily.  90 tablet  3   No current facility-administered medications on file prior to visit.    BP 170/100  Pulse 76  Temp(Src) 98.2 F (36.8 C)  Resp 16  Ht 6' (1.829 m)  Wt 214 lb (97.07 kg)  BMI 29.02 kg/m2       Objective:   Physical Exam  Nursing note and vitals reviewed. Constitutional: He appears well-developed and well-nourished.  HENT:  Head: Normocephalic and atraumatic.  Eyes: Conjunctivae are normal. Pupils are equal, round, and reactive to light.  Neck: Normal range of motion. Neck supple.  Cardiovascular: Normal rate and regular rhythm.   Pulmonary/Chest: Effort normal and breath sounds normal.  Abdominal: Soft. Bowel sounds are normal.    Ganglion cyst      Assessment & Plan:  Increase the benicar to 40 irritability and stress

## 2013-12-24 NOTE — Progress Notes (Signed)
Pre visit review using our clinic review tool, if applicable. No additional management support is needed unless otherwise documented below in the visit note. 

## 2013-12-24 NOTE — Patient Instructions (Addendum)
Take two of the 20 mg benicar  Resume the tricor   Call for Dr [name] or Larose Kells

## 2013-12-25 ENCOUNTER — Telehealth: Payer: Self-pay | Admitting: Internal Medicine

## 2013-12-25 LAB — HEPATIC FUNCTION PANEL
ALK PHOS: 48 U/L (ref 39–117)
ALT: 118 U/L — AB (ref 0–53)
AST: 56 U/L — AB (ref 0–37)
Albumin: 4.3 g/dL (ref 3.5–5.2)
BILIRUBIN DIRECT: 0 mg/dL (ref 0.0–0.3)
TOTAL PROTEIN: 7.5 g/dL (ref 6.0–8.3)
Total Bilirubin: 0.8 mg/dL (ref 0.3–1.2)

## 2013-12-25 NOTE — Telephone Encounter (Signed)
Relevant patient education assigned to patient using Emmi. ° °

## 2013-12-31 ENCOUNTER — Encounter: Payer: Self-pay | Admitting: Internal Medicine

## 2013-12-31 DIAGNOSIS — R945 Abnormal results of liver function studies: Principal | ICD-10-CM

## 2013-12-31 DIAGNOSIS — R7989 Other specified abnormal findings of blood chemistry: Secondary | ICD-10-CM

## 2014-02-05 ENCOUNTER — Encounter: Payer: Self-pay | Admitting: Internal Medicine

## 2014-02-23 ENCOUNTER — Encounter: Payer: Self-pay | Admitting: Internal Medicine

## 2014-03-02 ENCOUNTER — Encounter: Payer: Self-pay | Admitting: Podiatry

## 2014-03-02 ENCOUNTER — Ambulatory Visit (INDEPENDENT_AMBULATORY_CARE_PROVIDER_SITE_OTHER): Payer: 59 | Admitting: Podiatry

## 2014-03-02 VITALS — BP 153/91 | HR 66 | Ht 72.0 in | Wt 218.0 lb

## 2014-03-02 DIAGNOSIS — M7130 Other bursal cyst, unspecified site: Secondary | ICD-10-CM

## 2014-03-02 DIAGNOSIS — S86899A Other injury of other muscle(s) and tendon(s) at lower leg level, unspecified leg, initial encounter: Secondary | ICD-10-CM

## 2014-03-02 DIAGNOSIS — M216X9 Other acquired deformities of unspecified foot: Secondary | ICD-10-CM | POA: Insufficient documentation

## 2014-03-02 DIAGNOSIS — M79609 Pain in unspecified limb: Secondary | ICD-10-CM

## 2014-03-02 DIAGNOSIS — M79606 Pain in leg, unspecified: Secondary | ICD-10-CM

## 2014-03-02 HISTORY — DX: Other bursal cyst, unspecified site: M71.30

## 2014-03-02 HISTORY — DX: Pain in leg, unspecified: M79.606

## 2014-03-02 NOTE — Progress Notes (Signed)
Subjective: 38 year old male presents stating that he has recurring cyst over 2nd digit right foot for the past 6-9 months.  The cyst gets burst open eventually and the area dries out. In 2-3 weeks a new cyst forms eventually and gets painful.  Stated that he also get shin splints on both legs after prolonged running. He hasn't been running for a while due to the lesion on the toe.  Review of Systems - General ROS: negative for - chills, fatigue, fever, malaise or weight loss Ophthalmic ROS: negative ENT ROS: negative Endocrine ROS: negative Respiratory ROS: no cough, shortness of breath, or wheezing Cardiovascular ROS: no chest pain or dyspnea on exertion Gastrointestinal ROS: has IBS and takes pills. Genito-Urinary ROS: no dysuria, trouble voiding, or hematuria Musculoskeletal ROS: negative Neurological ROS: no TIA or stroke symptoms Dermatological ROS: negative.  Objective: Dermatologic: Small pink dry skin from old bursted cystic lesion over 2nd digit DIPJ right foot. Vascular: All pedal pulses are palpable. Neurologic: All epicritic and tactile sensations grossly intact. Orthopedic: Rigid cavus type foot. Contracted and curbed digit at PIPJ and DIPJ 2nd right.  Tight Achilles tendon R>L.  Increased sagittal plane motion of the first ray bilateral.  Assessment: 1. 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 2. Contracted digit at PIPJ and DIPJ 2nd right. 3. 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  4. Compensated rear foot varus bilateral.  Plan: Reviewed findings and available treatment options such as excision of the lesion with an understanding that the lesion has high risk of recurrence in nature.   Discussed stretch exercise for tight Achilles tendon and benefit of Orthotic shoe inserts.  Patient will return for excisional procedure and for orthotic preparation.

## 2014-03-02 NOTE — Patient Instructions (Signed)
Seen for 2nd toe lesion with cyst that burst and comes back. Also problem with shin splint.  Noted of tight achilles tendon and weakening of the first ray. May benefit from Orthotic shoe insert and stretch exercise for tight Achilles tendon. Will excise the cyst from 2nd digit right foot next week at the office.

## 2014-03-03 ENCOUNTER — Encounter: Payer: Self-pay | Admitting: Podiatry

## 2014-03-20 ENCOUNTER — Ambulatory Visit (INDEPENDENT_AMBULATORY_CARE_PROVIDER_SITE_OTHER): Payer: 59 | Admitting: Podiatry

## 2014-03-20 ENCOUNTER — Encounter: Payer: Self-pay | Admitting: Podiatry

## 2014-03-20 VITALS — BP 147/77 | HR 70 | Ht 72.0 in | Wt 218.0 lb

## 2014-03-20 DIAGNOSIS — M7130 Other bursal cyst, unspecified site: Secondary | ICD-10-CM

## 2014-03-20 DIAGNOSIS — M79606 Pain in leg, unspecified: Secondary | ICD-10-CM

## 2014-03-20 DIAGNOSIS — M79609 Pain in unspecified limb: Secondary | ICD-10-CM

## 2014-03-20 MED ORDER — HYDROCODONE-IBUPROFEN 7.5-200 MG PO TABS: 1.0000 | ORAL_TABLET | Freq: Four times a day (QID) | ORAL | Status: DC | PRN

## 2014-03-20 NOTE — Progress Notes (Signed)
38 year old male presents accompanied by a male/wife/ requesting surgical removal of lesion on 2nd toe right.  The lesion was examined and all available options were discussed during his last visit.   Tx. 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 Excision lesion 2nd digit DIPJ under local anesthetic with 1% Xylocaine with epinephrine total 3 ml.  Incision was placed over DIPJ of 2nd digit dorsum, elliptical shape, medial to lateral. Closure was made using 4/0 Nylon.  Patient tolerated the procedure well. Post op instructions discussed.

## 2014-03-20 NOTE — Patient Instructions (Signed)
Excision lesion right 2nd digit done. Keep the surgical site dry till next appointment.

## 2014-03-27 ENCOUNTER — Encounter: Payer: Self-pay | Admitting: Podiatry

## 2014-03-27 ENCOUNTER — Ambulatory Visit (INDEPENDENT_AMBULATORY_CARE_PROVIDER_SITE_OTHER): Payer: 59 | Admitting: Podiatry

## 2014-03-27 DIAGNOSIS — M7130 Other bursal cyst, unspecified site: Secondary | ICD-10-CM

## 2014-03-27 NOTE — Progress Notes (Signed)
Subjective: One week post op wound 2nd digit right foot.  Patient changed the dressing himself because he saw orange color. He thought it was bleeding.   Objective: Surgical wound is not completely dry.  There is small wet spot at the center of the incision.  Assessment: Status post excision of cyst   Plan: Area cleansed with betadine solution. Compression bandage applied with Betadine soaked sterile gauze dressing and Coban.  Patient is not to change dressing, keep it clean and dry.

## 2014-03-27 NOTE — Patient Instructions (Signed)
1 week post op check. Keep the dressing intact and dry. Return in one week.

## 2014-04-03 ENCOUNTER — Encounter: Payer: Self-pay | Admitting: Podiatry

## 2014-04-03 ENCOUNTER — Ambulatory Visit (INDEPENDENT_AMBULATORY_CARE_PROVIDER_SITE_OTHER): Payer: 59 | Admitting: Podiatry

## 2014-04-03 ENCOUNTER — Encounter: Payer: 59 | Admitting: Podiatry

## 2014-04-03 DIAGNOSIS — M7130 Other bursal cyst, unspecified site: Secondary | ICD-10-CM

## 2014-04-03 NOTE — Progress Notes (Signed)
Post surgical wound 2nd toe right.  Stated that he had to removed bandage and replaced with clean bandage yesterday.  Objective: Suture removed. Clean and dry. Slight redness surrounding the suture.  Assessment: Post op incision site is healing well. Plan: Keep it secured with porous tape and coban bandage for the next 2-3 weeks.  Supply dispensed. Return as needed.

## 2014-04-03 NOTE — Patient Instructions (Signed)
Post surgical wound 2nd toe right. Suture removed. Healing well. Keep it secured with porous tape and coban bandage for the next 2-3 weeks.  Return as needed.

## 2014-04-17 ENCOUNTER — Telehealth: Payer: Self-pay | Admitting: Internal Medicine

## 2014-04-17 DIAGNOSIS — G4700 Insomnia, unspecified: Secondary | ICD-10-CM

## 2014-04-17 MED ORDER — TEMAZEPAM 15 MG PO CAPS: 15.0000 mg | ORAL_CAPSULE | Freq: Every evening | ORAL | Status: DC | PRN

## 2014-04-17 NOTE — Telephone Encounter (Signed)
Ok per Dr Arnoldo Morale, rx faxed to [facility]

## 2014-04-17 NOTE — Telephone Encounter (Signed)
[facility] is requesting re-fill on temazepam (RESTORIL) 15 MG capsule

## 2014-05-11 ENCOUNTER — Other Ambulatory Visit: Payer: Self-pay | Admitting: Internal Medicine

## 2014-06-08 ENCOUNTER — Encounter: Payer: Self-pay | Admitting: Family Medicine

## 2014-06-12 ENCOUNTER — Ambulatory Visit (INDEPENDENT_AMBULATORY_CARE_PROVIDER_SITE_OTHER): Payer: 59 | Admitting: Internal Medicine

## 2014-06-12 ENCOUNTER — Encounter: Payer: Self-pay | Admitting: Internal Medicine

## 2014-06-12 VITALS — BP 126/76 | HR 70 | Temp 97.8°F | Ht 73.0 in | Wt 222.8 lb

## 2014-06-12 DIAGNOSIS — S4992XA Unspecified injury of left shoulder and upper arm, initial encounter: Secondary | ICD-10-CM

## 2014-06-12 DIAGNOSIS — Z23 Encounter for immunization: Secondary | ICD-10-CM

## 2014-06-12 DIAGNOSIS — T07XXXA Unspecified multiple injuries, initial encounter: Secondary | ICD-10-CM

## 2014-06-12 NOTE — Patient Instructions (Signed)
Please call if he not these any fevers, increased redness or swelling on your left arm

## 2014-06-12 NOTE — Progress Notes (Signed)
   Subjective:    Patient ID: Christopher [name], male    DOB: 1976-10-26, 38 y.o.   MRN: [id number]  DOS:  06/12/2014 Type of visit - description: acute History: Cut  his left arm 2 weeks ago while working at [employer]. Has been putting an antibiotic ointment. Denies any pain or discharge Compared to last week, the area looks better.   ROS No f/c  Past Medical History  Diagnosis Date  . GERD (gastroesophageal reflux disease)   . Hyperlipidemia   . IBS (irritable bowel syndrome)   . Hypertension   . Pain in lower limb 03/02/2014  . 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 Right wrist  surgery with plate      History   Social History  . Marital Status: Married    Spouse Name: N/A    Number of Children: N/A  . Years of Education: N/A   Occupational History  . Not on file.   Social History Main Topics  . Smoking status: Never Smoker   . Smokeless tobacco: Never Used  . Alcohol Use: Yes  . Drug Use: No  . Sexual Activity: Yes   Other Topics Concern  . Not on file   Social History Narrative  . No narrative on file        Medication List       This list is accurate as of: 06/12/14 11:59 PM.  Always use your most recent med list.               clidinium-chlordiazePOXIDE 5-2.5 MG per capsule  Commonly known as:  LIBRAX  Take 1 capsule by mouth daily.     fenofibrate 145 MG tablet  Commonly known as:  TRICOR  Take 1 tablet by mouth  daily     HYDROcodone-ibuprofen 7.5-200 MG per tablet  Commonly known as:  VICOPROFEN  Take 1 tablet by mouth every 6 (six) hours as needed for moderate pain.     olmesartan 40 MG tablet  Commonly known as:  BENICAR  TAKE 1 TABLET DAILY     pantoprazole 40 MG tablet  Commonly known as:  PROTONIX  Take 1 tablet by mouth  every day     temazepam 15 MG capsule  Commonly known as:  RESTORIL  Take 1 capsule (15 mg total) by mouth at bedtime as needed for sleep.             Objective:   Physical Exam  Constitutional: He appears well-developed and well-nourished. No distress.  Musculoskeletal:       Arms: Skin: He is not diaphoretic.  Psychiatric: He has a normal mood and affect. His behavior is normal. Judgment and thought content normal.   BP 126/76  Pulse 70  Temp(Src) 97.8 F (36.6 C)  Ht 6\' 1"  (1.854 m)  Wt 222 lb 12.8 oz (101.061 kg)  BMI 29.40 kg/m2  SpO2 98%       Assessment & Plan:    Left injury, No evidence of abscess or cellulitis Last  Td was 2007, will provide a booster, patient to call if evidence of infections surface.  Plans to get established w/  Dr T in few months Denies the need for a RF BP well controlled today

## 2014-06-12 NOTE — Progress Notes (Signed)
Pre visit review using our clinic review tool, if applicable. No additional management support is needed unless otherwise documented below in the visit note. 

## 2014-06-23 ENCOUNTER — Encounter: Payer: Self-pay | Admitting: Internal Medicine

## 2014-06-24 ENCOUNTER — Encounter: Payer: Self-pay | Admitting: Family Medicine

## 2014-06-24 DIAGNOSIS — M79606 Pain in leg, unspecified: Secondary | ICD-10-CM

## 2014-06-25 NOTE — Telephone Encounter (Signed)
Referral placed.

## 2014-06-30 ENCOUNTER — Ambulatory Visit (INDEPENDENT_AMBULATORY_CARE_PROVIDER_SITE_OTHER): Payer: 59 | Admitting: Family Medicine

## 2014-06-30 ENCOUNTER — Encounter: Payer: Self-pay | Admitting: Family Medicine

## 2014-06-30 VITALS — BP 156/94 | HR 74 | Ht 72.0 in | Wt 215.0 lb

## 2014-06-30 DIAGNOSIS — M79604 Pain in right leg: Secondary | ICD-10-CM

## 2014-06-30 DIAGNOSIS — M79609 Pain in unspecified limb: Secondary | ICD-10-CM

## 2014-06-30 DIAGNOSIS — M79605 Pain in left leg: Principal | ICD-10-CM

## 2014-06-30 NOTE — Patient Instructions (Signed)
Your lower leg pain is nothing to be concerned about. It is similar to shin splints but of the fibula, not tibias. Get a new pair of shoes - best to probably be fitted for them at a place like [facility]. Consider dr. Zoe Lan active series, spencos or something similar. Icing 15 minutes after exercising. Tylenol, ibuprofen if needed. Follow up with me as needed.

## 2014-07-01 ENCOUNTER — Encounter: Payer: Self-pay | Admitting: Family Medicine

## 2014-07-01 NOTE — Progress Notes (Signed)
Patient ID: Christopher [name], male   DOB: 1976/04/10, 38 y.o.   MRN: [id number]  PCP: Georgetta Haber, MD Referred by: Dimple Nanas MD  Subjective:   HPI: Patient is a 38 y.o. male here for bilateral leg pain.  Patient reports he has recently started brisk walking, light jogging with his dog. Reports over past few weeks has started to get lateral bilateral lower leg pain. No associated swelling or bruising. No numbness or tingling. Has not tried any medicines or icing for this. Done a little stretching which has not helped much. No prior issues. No history of stress fracture.  Past Medical History  Diagnosis Date  . GERD (gastroesophageal reflux disease)   . Hyperlipidemia   . IBS (irritable bowel syndrome)   . Hypertension   . Pain in lower limb 03/02/2014  . 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 clidinium-chlordiazePOXIDE (LIBRAX) 2.5-5 MG per capsule Take 1 capsule by mouth daily.  90 capsule  3  . fenofibrate (TRICOR) 145 MG tablet Take 1 tablet by mouth  daily  90 tablet  1  . olmesartan (BENICAR) 40 MG tablet TAKE 1 TABLET DAILY  90 tablet  3  . pantoprazole (PROTONIX) 40 MG tablet Take 1 tablet by mouth  every day  90 tablet  3  . HYDROcodone-ibuprofen (VICOPROFEN) 7.5-200 MG per tablet Take 1 tablet by mouth every 6 (six) hours as needed for moderate pain.  45 tablet  0  . temazepam (RESTORIL) 15 MG capsule Take 1 capsule (15 mg total) by mouth at bedtime as needed for sleep.  90 capsule  1   No current facility-administered medications on file prior to visit.    Past Surgical History  Procedure Laterality Date  . Right wrist  surgery with plate      No Known Allergies  History   Social History  . Marital Status: Married    Spouse Name: N/A    Number of Children: N/A  . Years of Education: N/A   Occupational History  . Not on file.   Social History Main Topics  . Smoking  status: Never Smoker   . Smokeless tobacco: Never Used  . Alcohol Use: Yes  . Drug Use: No  . Sexual Activity: Yes   Other Topics Concern  . Not on file   Social History Narrative  . No narrative on file    Family History  Problem Relation Age of Onset  . Hypertension Mother   . Hyperlipidemia Mother   . Hypertension Father     BP 156/94  Pulse 74  Ht 6' (1.829 m)  Wt 215 lb (97.523 kg)  BMI 29.15 kg/m2  Review of Systems: See HPI above.    Objective:  Physical Exam:  Gen: NAD  Bilateral lower legs: No gross deformity, swelling, bruising. No tenderness to palpation - points to entire lateral lower legs overlying fibulas as location when pain comes on. FROM ankles and knees without pain. Strength 5/5 all directions ankles and knees. NVI distally. Negative hop tests.  Gait: outturns right foot.  No other abnormalities. Moderate overpronation bilaterally.    Assessment & Plan:  1. Bilateral lower leg pain - volume of running, exam not consistent with stress fracture (also bilateral making this very unlikely).  Believe this represents something similar to shin splints but of the fibulas.  His shoes are broken down - advised to get fitted for a new  pair of these.  Icing, nsaids, tylenol if needed.  Sports insoles with arch supports if change in shoes not enough to help.  F/u prn.

## 2014-07-02 ENCOUNTER — Encounter: Payer: Self-pay | Admitting: Family Medicine

## 2014-07-02 DIAGNOSIS — M79604 Pain in right leg: Secondary | ICD-10-CM | POA: Insufficient documentation

## 2014-07-02 DIAGNOSIS — M79605 Pain in left leg: Principal | ICD-10-CM

## 2014-07-02 NOTE — Assessment & Plan Note (Signed)
volume of running, exam not consistent with stress fracture (also bilateral making this very unlikely).  Believe this represents something similar to shin splints but of the fibulas.  His shoes are broken down - advised to get fitted for a new pair of these.  Icing, nsaids, tylenol if needed.  Sports insoles with arch supports if change in shoes not enough to help.  F/u prn.

## 2014-07-22 ENCOUNTER — Ambulatory Visit: Payer: 59 | Admitting: Family Medicine

## 2014-07-22 ENCOUNTER — Inpatient Hospital Stay (HOSPITAL_COMMUNITY): Payer: 59

## 2014-07-22 ENCOUNTER — Encounter (HOSPITAL_COMMUNITY): Payer: Self-pay | Admitting: Emergency Medicine

## 2014-07-22 ENCOUNTER — Emergency Department (HOSPITAL_COMMUNITY): Payer: 59

## 2014-07-22 ENCOUNTER — Inpatient Hospital Stay (HOSPITAL_COMMUNITY)
Admission: EM | Admit: 2014-07-22 | Discharge: 2014-07-27 | DRG: 439 | Disposition: A | Discharge status: Home / Self Care | Payer: 59 | Attending: Internal Medicine | Admitting: Internal Medicine

## 2014-07-22 DIAGNOSIS — G8929 Other chronic pain: Secondary | ICD-10-CM | POA: Diagnosis present

## 2014-07-22 DIAGNOSIS — R7401 Elevation of levels of liver transaminase levels: Secondary | ICD-10-CM | POA: Diagnosis present

## 2014-07-22 DIAGNOSIS — N179 Acute kidney failure, unspecified: Secondary | ICD-10-CM

## 2014-07-22 DIAGNOSIS — M545 Low back pain, unspecified: Secondary | ICD-10-CM | POA: Diagnosis present

## 2014-07-22 DIAGNOSIS — K859 Acute pancreatitis without necrosis or infection, unspecified: Secondary | ICD-10-CM | POA: Diagnosis not present

## 2014-07-22 DIAGNOSIS — K852 Alcohol induced acute pancreatitis without necrosis or infection: Secondary | ICD-10-CM

## 2014-07-22 DIAGNOSIS — G43909 Migraine, unspecified, not intractable, without status migrainosus: Secondary | ICD-10-CM | POA: Diagnosis present

## 2014-07-22 DIAGNOSIS — J301 Allergic rhinitis due to pollen: Secondary | ICD-10-CM

## 2014-07-22 DIAGNOSIS — K219 Gastro-esophageal reflux disease without esophagitis: Secondary | ICD-10-CM | POA: Diagnosis present

## 2014-07-22 DIAGNOSIS — K7689 Other specified diseases of liver: Secondary | ICD-10-CM

## 2014-07-22 DIAGNOSIS — F1010 Alcohol abuse, uncomplicated: Secondary | ICD-10-CM

## 2014-07-22 DIAGNOSIS — K118 Other diseases of salivary glands: Secondary | ICD-10-CM

## 2014-07-22 DIAGNOSIS — M7130 Other bursal cyst, unspecified site: Secondary | ICD-10-CM

## 2014-07-22 DIAGNOSIS — E785 Hyperlipidemia, unspecified: Secondary | ICD-10-CM | POA: Diagnosis present

## 2014-07-22 DIAGNOSIS — M79604 Pain in right leg: Secondary | ICD-10-CM

## 2014-07-22 DIAGNOSIS — R740 Nonspecific elevation of levels of transaminase and lactic acid dehydrogenase [LDH]: Secondary | ICD-10-CM

## 2014-07-22 DIAGNOSIS — K760 Fatty (change of) liver, not elsewhere classified: Secondary | ICD-10-CM

## 2014-07-22 DIAGNOSIS — I10 Essential (primary) hypertension: Secondary | ICD-10-CM | POA: Diagnosis present

## 2014-07-22 DIAGNOSIS — M79605 Pain in left leg: Secondary | ICD-10-CM

## 2014-07-22 DIAGNOSIS — K589 Irritable bowel syndrome without diarrhea: Secondary | ICD-10-CM | POA: Diagnosis present

## 2014-07-22 HISTORY — DX: Personal history of other diseases of the digestive system: Z87.19

## 2014-07-22 HISTORY — DX: Migraine, unspecified, not intractable, without status migrainosus: G43.909

## 2014-07-22 LAB — CBC
HCT: 38.2 % — ABNORMAL LOW (ref 39.0–52.0)
HEMOGLOBIN: 12.7 g/dL — AB (ref 13.0–17.0)
MCH: 29.5 pg (ref 26.0–34.0)
MCHC: 33.2 g/dL (ref 30.0–36.0)
MCV: 88.6 fL (ref 78.0–100.0)
PLATELETS: 322 10*3/uL (ref 150–400)
RBC: 4.31 MIL/uL (ref 4.22–5.81)
RDW: 12.5 % (ref 11.5–15.5)
WBC: 4.8 10*3/uL (ref 4.0–10.5)

## 2014-07-22 LAB — I-STAT CHEM 8, ED
BUN: 17 mg/dL (ref 6–23)
CALCIUM ION: 1.1 mmol/L — AB (ref 1.12–1.23)
CHLORIDE: 107 meq/L (ref 96–112)
Creatinine, Ser: 1.4 mg/dL — ABNORMAL HIGH (ref 0.50–1.35)
Glucose, Bld: 111 mg/dL — ABNORMAL HIGH (ref 70–99)
HEMATOCRIT: 40 % (ref 39.0–52.0)
HEMOGLOBIN: 13.6 g/dL (ref 13.0–17.0)
Potassium: 3.8 mEq/L (ref 3.7–5.3)
Sodium: 139 mEq/L (ref 137–147)
TCO2: 25 mmol/L (ref 0–100)

## 2014-07-22 LAB — LIPID PANEL
CHOL/HDL RATIO: 5.2 ratio
Cholesterol: 198 mg/dL (ref 0–200)
HDL: 38 mg/dL — AB (ref >39)
LDL CALC: 111 mg/dL — AB (ref 0–99)
TRIGLYCERIDES: 246 mg/dL — AB (ref <150)
VLDL: 49 mg/dL — ABNORMAL HIGH (ref 0–40)

## 2014-07-22 LAB — COMPREHENSIVE METABOLIC PANEL
ALBUMIN: 4.2 g/dL (ref 3.5–5.2)
ALT: 118 U/L — ABNORMAL HIGH (ref 0–53)
ANION GAP: 14 (ref 5–15)
AST: 69 U/L — ABNORMAL HIGH (ref 0–37)
Alkaline Phosphatase: 37 U/L — ABNORMAL LOW (ref 39–117)
BILIRUBIN TOTAL: 0.5 mg/dL (ref 0.3–1.2)
BUN: 16 mg/dL (ref 6–23)
CHLORIDE: 104 meq/L (ref 96–112)
CO2: 24 mEq/L (ref 19–32)
CREATININE: 1.37 mg/dL — AB (ref 0.50–1.35)
Calcium: 9.2 mg/dL (ref 8.4–10.5)
GFR calc Af Amer: 74 mL/min — ABNORMAL LOW (ref >90)
GFR calc non Af Amer: 64 mL/min — ABNORMAL LOW (ref >90)
Glucose, Bld: 110 mg/dL — ABNORMAL HIGH (ref 70–99)
Potassium: 4.1 mEq/L (ref 3.7–5.3)
Sodium: 142 mEq/L (ref 137–147)
Total Protein: 7.4 g/dL (ref 6.0–8.3)

## 2014-07-22 LAB — I-STAT TROPONIN, ED: TROPONIN I, POC: 0 ng/mL (ref 0.00–0.08)

## 2014-07-22 LAB — LIPASE, BLOOD: LIPASE: 1358 U/L — AB (ref 11–59)

## 2014-07-22 MED ORDER — CILIDINIUM-CHLORDIAZEPOXIDE 2.5-5 MG PO CAPS
1.0000 | ORAL_CAPSULE | Freq: Every day | ORAL | Status: DC
  Administered 2014-07-23 – 2014-07-27 (×5): 1 via ORAL
  Filled 2014-07-22 (×7): qty 1

## 2014-07-22 MED ORDER — ONDANSETRON HCL 4 MG/2ML IJ SOLN
4.0000 mg | Freq: Once | INTRAMUSCULAR | Status: AC
  Administered 2014-07-22: 4 mg via INTRAVENOUS
  Filled 2014-07-22: qty 2

## 2014-07-22 MED ORDER — ADULT MULTIVITAMIN W/MINERALS CH
1.0000 | ORAL_TABLET | Freq: Every day | ORAL | Status: DC
  Administered 2014-07-22 – 2014-07-27 (×6): 1 via ORAL
  Filled 2014-07-22 (×6): qty 1

## 2014-07-22 MED ORDER — HYDRALAZINE HCL 20 MG/ML IJ SOLN
10.0000 mg | INTRAMUSCULAR | Status: DC | PRN
  Administered 2014-07-23: 10 mg via INTRAVENOUS
  Filled 2014-07-22: qty 1

## 2014-07-22 MED ORDER — THIAMINE HCL 100 MG/ML IJ SOLN
100.0000 mg | Freq: Every day | INTRAMUSCULAR | Status: DC
  Filled 2014-07-22 (×5): qty 1

## 2014-07-22 MED ORDER — LORAZEPAM 2 MG/ML IJ SOLN: 1.0000 mg | Freq: Four times a day (QID) | INTRAMUSCULAR | Status: AC | PRN

## 2014-07-22 MED ORDER — HYDROMORPHONE HCL PF 1 MG/ML IJ SOLN
1.0000 mg | Freq: Once | INTRAMUSCULAR | Status: AC
Start: 2014-07-22 — End: 2014-07-22
  Administered 2014-07-22: 1 mg via INTRAVENOUS
  Filled 2014-07-22: qty 1

## 2014-07-22 MED ORDER — SODIUM CHLORIDE 0.9 % IV BOLUS (SEPSIS)
1000.0000 mL | Freq: Once | INTRAVENOUS | Status: AC
  Administered 2014-07-22: 1000 mL via INTRAVENOUS

## 2014-07-22 MED ORDER — ONDANSETRON HCL 4 MG/2ML IJ SOLN
4.0000 mg | Freq: Three times a day (TID) | INTRAMUSCULAR | Status: DC | PRN
  Administered 2014-07-24 (×2): 4 mg via INTRAVENOUS
  Filled 2014-07-22 (×2): qty 2

## 2014-07-22 MED ORDER — SODIUM CHLORIDE 0.9 % IV SOLN
INTRAVENOUS | Status: DC
  Administered 2014-07-22 – 2014-07-23 (×4): via INTRAVENOUS
  Administered 2014-07-24: 100 mL/h via INTRAVENOUS
  Administered 2014-07-24 – 2014-07-26 (×3): via INTRAVENOUS
  Administered 2014-07-26: 50 mL/h via INTRAVENOUS
  Administered 2014-07-26: 06:00:00 via INTRAVENOUS

## 2014-07-22 MED ORDER — HYDROMORPHONE HCL PF 1 MG/ML IJ SOLN
1.0000 mg | INTRAMUSCULAR | Status: DC | PRN
  Administered 2014-07-22 – 2014-07-25 (×19): 1 mg via INTRAVENOUS
  Filled 2014-07-22 (×19): qty 1

## 2014-07-22 MED ORDER — FOLIC ACID 1 MG PO TABS
1.0000 mg | ORAL_TABLET | Freq: Every day | ORAL | Status: DC
  Administered 2014-07-22 – 2014-07-27 (×6): 1 mg via ORAL
  Filled 2014-07-22 (×6): qty 1

## 2014-07-22 MED ORDER — VITAMIN B-1 100 MG PO TABS
100.0000 mg | ORAL_TABLET | Freq: Every day | ORAL | Status: DC
  Administered 2014-07-22 – 2014-07-27 (×6): 100 mg via ORAL
  Filled 2014-07-22 (×6): qty 1

## 2014-07-22 MED ORDER — TEMAZEPAM 15 MG PO CAPS: 15.0000 mg | ORAL_CAPSULE | Freq: Every evening | ORAL | Status: DC | PRN

## 2014-07-22 MED ORDER — PANTOPRAZOLE SODIUM 40 MG PO TBEC
40.0000 mg | DELAYED_RELEASE_TABLET | Freq: Every day | ORAL | Status: DC
  Administered 2014-07-22 – 2014-07-27 (×6): 40 mg via ORAL
  Filled 2014-07-22 (×6): qty 1

## 2014-07-22 MED ORDER — IOHEXOL 350 MG/ML SOLN
100.0000 mL | Freq: Once | INTRAVENOUS | Status: AC | PRN
  Administered 2014-07-22: 100 mL via INTRAVENOUS

## 2014-07-22 MED ORDER — HEPARIN SODIUM (PORCINE) 5000 UNIT/ML IJ SOLN
5000.0000 [IU] | Freq: Three times a day (TID) | INTRAMUSCULAR | Status: DC
  Administered 2014-07-23 – 2014-07-25 (×7): 5000 [IU] via SUBCUTANEOUS
  Filled 2014-07-22 (×19): qty 1

## 2014-07-22 MED ORDER — LORAZEPAM 1 MG PO TABS: 1.0000 mg | ORAL_TABLET | Freq: Four times a day (QID) | ORAL | Status: AC | PRN

## 2014-07-22 MED ORDER — MORPHINE SULFATE 4 MG/ML IJ SOLN
4.0000 mg | Freq: Once | INTRAMUSCULAR | Status: AC
  Administered 2014-07-22: 4 mg via INTRAVENOUS
  Filled 2014-07-22: qty 1

## 2014-07-22 NOTE — ED Notes (Signed)
Per pt sts epigastric pain that started last night. sts the pain wore this am and woke him up. sts radiated into his back.

## 2014-07-22 NOTE — H&P (Signed)
[facility] History and Physical  [name] IZT:[id number] DOB: Sep 04, 1976 DOA: 07/22/2014  Referring physician: er PCP: Georgetta Haber, MD   Chief Complaint: abd pain  HPI: Christopher [name] is a 38 y.o. male  Who went to the golf tournament this weekend and drank quite a bit.  Last night he developed epigastric pain radiating through to his back.  He was able to sleep but when he awoke this AM, it was worse.   Pain was relieved with dilaudid in the ER.    In the ER, he had a CTA chest/abd pelvis to rule of anyersym. He was found to have pancreatitis on CT scan.  Lipase was then found to be 1358.  Other labs remarkable for AKI.    No fever, no chills  -Patient states he does not drink daily- wife shook her head and says he does    Review of Systems:  All systems reviewed, negative unless stated above    Past Medical History  Diagnosis Date  . GERD (gastroesophageal reflux disease)   . Hyperlipidemia   . IBS (irritable bowel syndrome)   . Hypertension   . Pain in lower limb 03/02/2014  . 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 Right wrist  surgery with plate     Social History:  reports that he has never smoked. He has never used smokeless tobacco. He reports that he drinks alcohol. He reports that he does not use illicit drugs.  No Known Allergies  Family History  Problem Relation Age of Onset  . Hypertension Mother   . Hyperlipidemia Mother   . Hypertension Father      Prior to Admission medications   Medication Sig Start Date End Date Taking? Authorizing Provider  clidinium-chlordiazePOXIDE (LIBRAX) 2.5-5 MG per capsule Take 1 capsule by mouth daily. 05/13/13  Yes Ricard Dillon, MD  fenofibrate (TRICOR) 145 MG tablet Take 145 mg by mouth daily.   Yes Historical Provider, MD  Multiple Vitamins-Minerals (MULTIVITAMIN PO) Take 1 tablet by mouth daily.   Yes Historical Provider, MD  olmesartan (BENICAR) 40 MG  tablet Take 40 mg by mouth daily.   Yes Historical Provider, MD  pantoprazole (PROTONIX) 40 MG tablet Take 40 mg by mouth daily.   Yes Historical Provider, MD  temazepam (RESTORIL) 15 MG capsule Take 1 capsule (15 mg total) by mouth at bedtime as needed for sleep. 04/17/14  Yes Ricard Dillon, MD   Physical Exam: Filed Vitals:   07/22/14 1345 07/22/14 1400 07/22/14 1415 07/22/14 1430  BP: 141/82 153/87 150/84 157/86  Pulse: 72 62 72 62  Temp:      TempSrc:      Resp: 16 17 18 20   SpO2: 99% 98% 100% 97%    Wt Readings from Last 3 Encounters:  06/30/14 97.523 kg (215 lb)  06/12/14 101.061 kg (222 lb 12.8 oz)  03/20/14 98.884 kg (218 lb)    General:  Appears calm and comfortable Eyes: PERRL, normal lids, irises & conjunctiva ENT: grossly normal hearing, lips & tongue Neck: no LAD, masses or thyromegaly Cardiovascular: RRR, no m/r/g. No LE edema. Respiratory: CTA bilaterally, no w/r/r. Normal respiratory effort. Abdomen: +BS, tender to palpation Skin: no rash or induration seen on limited exam Musculoskeletal: grossly normal tone BUE/BLE Psychiatric: grossly normal mood and affect, speech fluent and appropriate Neurologic: grossly non-focal.          Labs on Admission:  Basic Metabolic Panel:  Recent  Labs Lab 07/22/14 0905 07/22/14 0926  NA 142 139  K 4.1 3.8  CL 104 107  CO2 24  --   GLUCOSE 110* 111*  BUN 16 17  CREATININE 1.37* 1.40*  CALCIUM 9.2  --    Liver Function Tests:  Recent Labs Lab 07/22/14 0905  AST 69*  ALT 118*  ALKPHOS 37*  BILITOT 0.5  PROT 7.4  ALBUMIN 4.2    Recent Labs Lab 07/22/14 0905  LIPASE 1358*   No results found for this basename: AMMONIA,  in the last 168 hours CBC:  Recent Labs Lab 07/22/14 0905 07/22/14 0926  WBC 4.8  --   HGB 12.7* 13.6  HCT 38.2* 40.0  MCV 88.6  --   PLT 322  --    Cardiac Enzymes: No results found for this basename: CKTOTAL, CKMB, CKMBINDEX, TROPONINI,  in the last 168 hours  BNP (last 3  results) No results found for this basename: PROBNP,  in the last 8760 hours CBG: No results found for this basename: GLUCAP,  in the last 168 hours  Radiological Exams on Admission: Ct Angio Chest Pe W/cm &/or Wo Cm  07/22/2014   CLINICAL DATA:  Chest pain and epigastric pain. Question aortic dissection. Shortness of breath.  EXAM: CT ANGIOGRAPHY CHEST, ABDOMEN AND PELVIS  TECHNIQUE: Multidetector CT imaging through the chest, abdomen and pelvis was performed using the standard protocol during bolus administration of intravenous contrast. Multiplanar reconstructed images and MIPs were obtained and reviewed to evaluate the vascular anatomy.  CONTRAST:  143mL OMNIPAQUE IOHEXOL 350 MG/ML SOLN  COMPARISON:  None.  FINDINGS: CTA CHEST FINDINGS  No pathologically enlarged mediastinal, hilar or axillary lymph nodes. No evidence of aortic dissection. Heart is mildly enlarged. No pericardial effusion.  Minimal dependent atelectasis in the lungs. Added density throughout the lungs is indicative of expiratory phase imaging. No pleural fluid. Airway is unremarkable.  Review of the MIP images confirms the above findings.  CTA ABDOMEN AND PELVIS FINDINGS  Liver is decreased in attenuation diffusely. Liver, gallbladder and adrenal glands are otherwise unremarkable. A 1.9 cm low-attenuation lesion in the interpolar right kidney measures 18 Hounsfield units. Kidneys and spleen are otherwise unremarkable.  There are inflammatory changes and fluid around the pancreas which appears slightly enlarged. Fluid extends along the right paracolic gutter. Pancreas is uniform in attenuation without evidence of necrosis.  Stomach is unremarkable. There may be mild duodenal wall thickening, secondarily. Small bowel and colon are otherwise unremarkable. Prostate and bladder are unremarkable. No pathologically enlarged lymph nodes. No pelvic free fluid.  Vague low attenuation is seen within the superior mesenteric vein with fairly uniform  opacification of the splenic and portal veins. Vascular structures are otherwise grossly patent. No evidence of aortic dissection. No worrisome lytic or sclerotic lesions. Scattered bone islands.  Review of the MIP images confirms the above findings.  IMPRESSION: 1. No evidence of aortic dissection. 2. Acute pancreatitis. 3. Vague low attenuation in the superior mesenteric vein may be due to mixing artifact. Difficult to definitively exclude thrombus. 4. Marked hepatic steatosis. 5. Low-attenuation lesion in the right kidney is likely a cyst although difficult to definitively characterize without precontrast imaging. Further evaluation with nonemergent pre and post contrast MRI should be considered. Pre and post contrast CT could alternatively be performed, but would likely be of decreased accuracy given lesion size.   Electronically Signed   By: Lorin Picket M.D.   On: 07/22/2014 10:22   Dg Abd Acute W/chest  07/22/2014  CLINICAL DATA:  Sudden onset epigastric and mid abdominal pain.  EXAM: ACUTE ABDOMEN SERIES (ABDOMEN 2 VIEW & CHEST 1 VIEW)  COMPARISON:  None.  FINDINGS: Frontal view of the chest shows midline trachea. Heart size normal. Lungs are somewhat low in volume but clear. No pleural fluid.  Two views of the abdomen showed a fair amount of stool in the ascending colon. No small bowel dilatation. No unexpected radiopaque calculi.  IMPRESSION: 1. No acute findings in the chest. 2. Question mild constipation.   Electronically Signed   By: Lorin Picket M.D.   On: 07/22/2014 09:34   Ct Cta Abd/pel W/cm &/or W/o Cm  07/22/2014   CLINICAL DATA:  Chest pain and epigastric pain. Question aortic dissection. Shortness of breath.  EXAM: CT ANGIOGRAPHY CHEST, ABDOMEN AND PELVIS  TECHNIQUE: Multidetector CT imaging through the chest, abdomen and pelvis was performed using the standard protocol during bolus administration of intravenous contrast. Multiplanar reconstructed images and MIPs were obtained and  reviewed to evaluate the vascular anatomy.  CONTRAST:  19mL OMNIPAQUE IOHEXOL 350 MG/ML SOLN  COMPARISON:  None.  FINDINGS: CTA CHEST FINDINGS  No pathologically enlarged mediastinal, hilar or axillary lymph nodes. No evidence of aortic dissection. Heart is mildly enlarged. No pericardial effusion.  Minimal dependent atelectasis in the lungs. Added density throughout the lungs is indicative of expiratory phase imaging. No pleural fluid. Airway is unremarkable.  Review of the MIP images confirms the above findings.  CTA ABDOMEN AND PELVIS FINDINGS  Liver is decreased in attenuation diffusely. Liver, gallbladder and adrenal glands are otherwise unremarkable. A 1.9 cm low-attenuation lesion in the interpolar right kidney measures 18 Hounsfield units. Kidneys and spleen are otherwise unremarkable.  There are inflammatory changes and fluid around the pancreas which appears slightly enlarged. Fluid extends along the right paracolic gutter. Pancreas is uniform in attenuation without evidence of necrosis.  Stomach is unremarkable. There may be mild duodenal wall thickening, secondarily. Small bowel and colon are otherwise unremarkable. Prostate and bladder are unremarkable. No pathologically enlarged lymph nodes. No pelvic free fluid.  Vague low attenuation is seen within the superior mesenteric vein with fairly uniform opacification of the splenic and portal veins. Vascular structures are otherwise grossly patent. No evidence of aortic dissection. No worrisome lytic or sclerotic lesions. Scattered bone islands.  Review of the MIP images confirms the above findings.  IMPRESSION: 1. No evidence of aortic dissection. 2. Acute pancreatitis. 3. Vague low attenuation in the superior mesenteric vein may be due to mixing artifact. Difficult to definitively exclude thrombus. 4. Marked hepatic steatosis. 5. Low-attenuation lesion in the right kidney is likely a cyst although difficult to definitively characterize without  precontrast imaging. Further evaluation with nonemergent pre and post contrast MRI should be considered. Pre and post contrast CT could alternatively be performed, but would likely be of decreased accuracy given lesion size.   Electronically Signed   By: Lorin Picket M.D.   On: 07/22/2014 10:22   US Abdomen Limited Ruq  07/22/2014   CLINICAL DATA:  Acute pancreatitis.  EXAM: US ABDOMEN LIMITED - RIGHT UPPER QUADRANT  COMPARISON:  CT abdomen pelvis 07/22/2014.  FINDINGS: Gallbladder:  No gallstones or wall thickening visualized. No sonographic Murphy sign noted.  Common bile duct:  Diameter: 4 mm, within normal limits.  Liver:  Diffusely increased in echogenicity.  IMPRESSION: Fatty liver.   Electronically Signed   By: Lorin Picket M.D.   On: 07/22/2014 12:01      Assessment/Plan Active Problems:  TRANSAMINASES, SERUM, ELEVATED   Acute pancreatitis   AKI (acute kidney injury)   Alcohol abuse   Fatty liver    Acute pancreatitis- most like from alcohol-  CIWA protocol Pain meds, IVF, NPO Trend labs in AM - U/S with out stone -triglycerides elevated but not by much  AKI -IVF -patient got contrast in ER -hold ARB  Fatty liver/elevated AST/ALT  Alcohol use -CIWA  Code Status: full DVT Prophylaxis: Family Communication: patient/wife Disposition Plan:   Time spent: 75 min  Eulogio Bear [facility] Pager [phone]  **Disclaimer: This note may have been dictated with voice recognition software. Similar sounding words can inadvertently be transcribed and this note may contain transcription errors which may not have been corrected upon publication of note.**

## 2014-07-22 NOTE — ED Provider Notes (Signed)
CSN: [id number]     Arrival date & time 07/22/14  1751 History   First MD Initiated Contact with Patient 07/22/14 [phone]     Chief Complaint  Patient presents with  . Abdominal Pain   HPI Christopher [name] is a 38 yo man with a PMH of fatty liver disease, hyperlipidemia, hypertension, GERD and IBS who was awoken from sleep last night with right lower back pain that was soon joined by pain at his sternum. He now describes the pain as constant, sharp, and radiating between his sternum and his back. He rates it at an 8/10 in intensity. It is It has gotten worse since it started. He denies nausea or vomiting. He took nothing for pain at home.   Past Medical History  Diagnosis Date  . GERD (gastroesophageal reflux disease)   . Hyperlipidemia   . IBS (irritable bowel syndrome)   . Hypertension   . Pain in lower limb 03/02/2014  . 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 Right wrist  surgery with plate     Family History  Problem Relation Age of Onset  . Hypertension Mother   . Hyperlipidemia Mother   . Hypertension Father    History  Substance Use Topics  . Smoking status: Never Smoker   . Smokeless tobacco: Never Used  . Alcohol Use: Yes    Review of Systems General: no recent illness Skin: door nail lesion on left forearm, no rashes HEENT: no headaches, no changes in hearing or vision Cardiac: no chest pain or palpitations Respiratory: no shortness of breath, some pain on deep breathing this morning GI: diarrhea (at baseline IBS), some heartburn (history of GERD) but it is well-controlled on protonix Urinary: no dysuria, hematuria, changes in urination Msk: some lumbar back pain, chronic Endocrine: lost some weight, then put it back on, no temperature intolerance Psychiatric: no history of anxiety or depression  Allergies  Review of patient's allergies indicates no known allergies.  Home Medications   Prior to Admission medications   Medication  Sig Start Date End Date Taking? Authorizing Provider  clidinium-chlordiazePOXIDE (LIBRAX) 2.5-5 MG per capsule Take 1 capsule by mouth daily. 05/13/13   Ricard Dillon, MD  fenofibrate (TRICOR) 145 MG tablet Take 1 tablet by mouth  daily 05/11/14   Ricard Dillon, MD  HYDROcodone-ibuprofen (VICOPROFEN) 7.5-200 MG per tablet Take 1 tablet by mouth every 6 (six) hours as needed for moderate pain. 03/20/14   Myeong Sheard, DPM  olmesartan (BENICAR) 40 MG tablet TAKE 1 TABLET DAILY 12/24/13   Ricard Dillon, MD  pantoprazole (PROTONIX) 40 MG tablet Take 1 tablet by mouth  every day 12/01/13   Ricard Dillon, MD  temazepam (RESTORIL) 15 MG capsule Take 1 capsule (15 mg total) by mouth at bedtime as needed for sleep. 04/17/14   Ricard Dillon, MD   BP 163/103  Pulse 77  Temp(Src) 97.6 F (36.4 C) (Oral)  Resp 20  SpO2 100% Physical Exam Appearance: diaphoretic, uncomfortable in bed, wife at bedside HEENT: AT/Ray, PERRL, EOMi Heart: RRR, normal S1S2, BP R arm 180/103 and L arm 169/102 Lungs: CTAB, no wheezes Abdomen: BS+, tender to palpation in upper quadrants, no hepatosplenomegaly Musculoskeletal: no joint swelling or tenderness Extremities: scattered circular lesions (bites?) on upper extremities, one 3" linear lesion on left forearm Neurologic: A&Ox3   ED Course  Procedures (including critical care time) Labs Review Labs Reviewed  CBC - Abnormal; Notable for the following:  Hemoglobin 12.7 (*)    HCT 38.2 (*)    All other components within normal limits  I-STAT CHEM 8, ED - Abnormal; Notable for the following:    Creatinine, Ser 1.40 (*)    Glucose, Bld 111 (*)    Calcium, Ion 1.10 (*)    All other components within normal limits  COMPREHENSIVE METABOLIC PANEL  LIPASE, BLOOD  I-STAT TROPOININ, ED    Imaging Review Ct Angio Chest Pe W/cm &/or Wo Cm  07/22/2014   CLINICAL DATA:  Chest pain and epigastric pain. Question aortic dissection. Shortness of breath.  EXAM: CT ANGIOGRAPHY  CHEST, ABDOMEN AND PELVIS  TECHNIQUE: Multidetector CT imaging through the chest, abdomen and pelvis was performed using the standard protocol during bolus administration of intravenous contrast. Multiplanar reconstructed images and MIPs were obtained and reviewed to evaluate the vascular anatomy.  CONTRAST:  159mL OMNIPAQUE IOHEXOL 350 MG/ML SOLN  COMPARISON:  None.  FINDINGS: CTA CHEST FINDINGS  No pathologically enlarged mediastinal, hilar or axillary lymph nodes. No evidence of aortic dissection. Heart is mildly enlarged. No pericardial effusion.  Minimal dependent atelectasis in the lungs. Added density throughout the lungs is indicative of expiratory phase imaging. No pleural fluid. Airway is unremarkable.  Review of the MIP images confirms the above findings.  CTA ABDOMEN AND PELVIS FINDINGS  Liver is decreased in attenuation diffusely. Liver, gallbladder and adrenal glands are otherwise unremarkable. A 1.9 cm low-attenuation lesion in the interpolar right kidney measures 18 Hounsfield units. Kidneys and spleen are otherwise unremarkable.  There are inflammatory changes and fluid around the pancreas which appears slightly enlarged. Fluid extends along the right paracolic gutter. Pancreas is uniform in attenuation without evidence of necrosis.  Stomach is unremarkable. There may be mild duodenal wall thickening, secondarily. Small bowel and colon are otherwise unremarkable. Prostate and bladder are unremarkable. No pathologically enlarged lymph nodes. No pelvic free fluid.  Vague low attenuation is seen within the superior mesenteric vein with fairly uniform opacification of the splenic and portal veins. Vascular structures are otherwise grossly patent. No evidence of aortic dissection. No worrisome lytic or sclerotic lesions. Scattered bone islands.  Review of the MIP images confirms the above findings.  IMPRESSION: 1. No evidence of aortic dissection. 2. Acute pancreatitis. 3. Vague low attenuation in the  superior mesenteric vein may be due to mixing artifact. Difficult to definitively exclude thrombus. 4. Marked hepatic steatosis. 5. Low-attenuation lesion in the right kidney is likely a cyst although difficult to definitively characterize without precontrast imaging. Further evaluation with nonemergent pre and post contrast MRI should be considered. Pre and post contrast CT could alternatively be performed, but would likely be of decreased accuracy given lesion size.   Electronically Signed   By: Lorin Picket M.D.   On: 07/22/2014 10:22   Dg Abd Acute W/chest  07/22/2014   CLINICAL DATA:  Sudden onset epigastric and mid abdominal pain.  EXAM: ACUTE ABDOMEN SERIES (ABDOMEN 2 VIEW & CHEST 1 VIEW)  COMPARISON:  None.  FINDINGS: Frontal view of the chest shows midline trachea. Heart size normal. Lungs are somewhat low in volume but clear. No pleural fluid.  Two views of the abdomen showed a fair amount of stool in the ascending colon. No small bowel dilatation. No unexpected radiopaque calculi.  IMPRESSION: 1. No acute findings in the chest. 2. Question mild constipation.   Electronically Signed   By: Lorin Picket M.D.   On: 07/22/2014 09:34   Ct Cta Abd/pel W/cm &/or W/o Cm  07/22/2014   CLINICAL DATA:  Chest pain and epigastric pain. Question aortic dissection. Shortness of breath.  EXAM: CT ANGIOGRAPHY CHEST, ABDOMEN AND PELVIS  TECHNIQUE: Multidetector CT imaging through the chest, abdomen and pelvis was performed using the standard protocol during bolus administration of intravenous contrast. Multiplanar reconstructed images and MIPs were obtained and reviewed to evaluate the vascular anatomy.  CONTRAST:  16mL OMNIPAQUE IOHEXOL 350 MG/ML SOLN  COMPARISON:  None.  FINDINGS: CTA CHEST FINDINGS  No pathologically enlarged mediastinal, hilar or axillary lymph nodes. No evidence of aortic dissection. Heart is mildly enlarged. No pericardial effusion.  Minimal dependent atelectasis in the lungs. Added  density throughout the lungs is indicative of expiratory phase imaging. No pleural fluid. Airway is unremarkable.  Review of the MIP images confirms the above findings.  CTA ABDOMEN AND PELVIS FINDINGS  Liver is decreased in attenuation diffusely. Liver, gallbladder and adrenal glands are otherwise unremarkable. A 1.9 cm low-attenuation lesion in the interpolar right kidney measures 18 Hounsfield units. Kidneys and spleen are otherwise unremarkable.  There are inflammatory changes and fluid around the pancreas which appears slightly enlarged. Fluid extends along the right paracolic gutter. Pancreas is uniform in attenuation without evidence of necrosis.  Stomach is unremarkable. There may be mild duodenal wall thickening, secondarily. Small bowel and colon are otherwise unremarkable. Prostate and bladder are unremarkable. No pathologically enlarged lymph nodes. No pelvic free fluid.  Vague low attenuation is seen within the superior mesenteric vein with fairly uniform opacification of the splenic and portal veins. Vascular structures are otherwise grossly patent. No evidence of aortic dissection. No worrisome lytic or sclerotic lesions. Scattered bone islands.  Review of the MIP images confirms the above findings.  IMPRESSION: 1. No evidence of aortic dissection. 2. Acute pancreatitis. 3. Vague low attenuation in the superior mesenteric vein may be due to mixing artifact. Difficult to definitively exclude thrombus. 4. Marked hepatic steatosis. 5. Low-attenuation lesion in the right kidney is likely a cyst although difficult to definitively characterize without precontrast imaging. Further evaluation with nonemergent pre and post contrast MRI should be considered. Pre and post contrast CT could alternatively be performed, but would likely be of decreased accuracy given lesion size.   Electronically Signed   By: Lorin Picket M.D.   On: 07/22/2014 10:22     EKG Interpretation None      MDM   Final  diagnoses:  None    Christopher [name] is a 38 yo man with a history of multiple GI problems who is presenting with constant, sharp chest pain radiating from his sternum to his right back. His CTA chest and CTA abdomen/pelvis showed acute pancreatitis. His lipase came back at 1358. In the ED, he has been given fluids (2 boluses), zofran, morphine and dilaudid. He was admitted for treatment.    Drucilla Schmidt, MD 07/24/14 1135

## 2014-07-23 ENCOUNTER — Inpatient Hospital Stay (HOSPITAL_COMMUNITY): Payer: 59

## 2014-07-23 DIAGNOSIS — I10 Essential (primary) hypertension: Secondary | ICD-10-CM

## 2014-07-23 DIAGNOSIS — R740 Nonspecific elevation of levels of transaminase and lactic acid dehydrogenase [LDH]: Secondary | ICD-10-CM

## 2014-07-23 DIAGNOSIS — R7402 Elevation of levels of lactic acid dehydrogenase (LDH): Secondary | ICD-10-CM

## 2014-07-23 DIAGNOSIS — E785 Hyperlipidemia, unspecified: Secondary | ICD-10-CM

## 2014-07-23 LAB — COMPREHENSIVE METABOLIC PANEL
ALBUMIN: 3.6 g/dL (ref 3.5–5.2)
ALT: 78 U/L — ABNORMAL HIGH (ref 0–53)
ANION GAP: 14 (ref 5–15)
AST: 35 U/L (ref 0–37)
Alkaline Phosphatase: 32 U/L — ABNORMAL LOW (ref 39–117)
BUN: 16 mg/dL (ref 6–23)
CALCIUM: 8 mg/dL — AB (ref 8.4–10.5)
CO2: 25 mEq/L (ref 19–32)
CREATININE: 1.06 mg/dL (ref 0.50–1.35)
Chloride: 101 mEq/L (ref 96–112)
GFR calc Af Amer: >90 mL/min (ref >90)
GFR calc non Af Amer: 87 mL/min — ABNORMAL LOW (ref >90)
Glucose, Bld: 109 mg/dL — ABNORMAL HIGH (ref 70–99)
Potassium: 4.4 mEq/L (ref 3.7–5.3)
Sodium: 140 mEq/L (ref 137–147)
Total Bilirubin: 0.8 mg/dL (ref 0.3–1.2)
Total Protein: 6.5 g/dL (ref 6.0–8.3)

## 2014-07-23 LAB — CBC
HEMATOCRIT: 35.6 % — AB (ref 39.0–52.0)
Hemoglobin: 11.8 g/dL — ABNORMAL LOW (ref 13.0–17.0)
MCH: 29.5 pg (ref 26.0–34.0)
MCHC: 33.1 g/dL (ref 30.0–36.0)
MCV: 89 fL (ref 78.0–100.0)
Platelets: 296 10*3/uL (ref 150–400)
RBC: 4 MIL/uL — AB (ref 4.22–5.81)
RDW: 12.6 % (ref 11.5–15.5)
WBC: 10.5 10*3/uL (ref 4.0–10.5)

## 2014-07-23 LAB — LIPASE, BLOOD: Lipase: 505 U/L — ABNORMAL HIGH (ref 11–59)

## 2014-07-23 MED ORDER — SODIUM CHLORIDE 0.9 % IV BOLUS (SEPSIS)
500.0000 mL | Freq: Once | INTRAVENOUS | Status: AC
  Administered 2014-07-23: 500 mL via INTRAVENOUS

## 2014-07-23 NOTE — Care Management Note (Signed)
    Page 1 of 1   07/27/2014     2:47:00 PM CARE MANAGEMENT NOTE 07/27/2014  Patient:  Christopher [name], Christopher [name]   Account Number:  [id number]  Date Initiated:  07/23/2014  Documentation initiated by:  Tomi Bamberger  Subjective/Objective Assessment:   dx acute pancreatitis, - etoh  admit- lives with spouse.     Action/Plan:   Anticipated DC Date:  07/27/2014   Anticipated DC Plan:  HOME/SELF CARE         Choice offered to / List presented to:             Status of service:  Completed, signed off Medicare Important Message given?  NO (If response is "NO", the following Medicare IM given date fields will be blank) Date Medicare IM given:   Medicare IM given by:   Date Additional Medicare IM given:   Additional Medicare IM given by:    Discharge Disposition:  HOME/SELF CARE  Per UR Regulation:  Reviewed for med. necessity/level of care/duration of stay  If discussed at Person of Stay Meetings, dates discussed:    Comments:  07/27/14 [place], BSN [phone] no needs anticipated.  07/23/14 [place], BSN [phone] patient lives with spouse, npo, will advance diet later today.  NCM will continue to follow for dc needs.

## 2014-07-23 NOTE — Progress Notes (Signed)
PATIENT DETAILS Name: Christopher [name] Age: 38 y.o. Sex: male Date of Birth: 20-Dec-1975 Admit Date: 07/22/2014 Admitting Physician Geradine Girt, DO [id number] [name], MD  Subjective: Slowly improving, but still with significant pain.  Assessment/Plan: Active Problems: Acute pancreatitis - Likely alcoholic- given significant alcohol intake this past Sunday. - CT of the abdomen negative for gallstones, however does show pancreatic inflammation. No evidence of gallstones seen on CT of the abdomen or RUQ ultrasound. - Continue n.p.o. status for now, continue supportive care with IV fluids, as needed narcotics. Once pain more improve, will start trial of clear liquids.    TRANSAMINASES, SERUM, ELEVATED - Likely secondary to alcohol intake. No evidence of gallstones seen on CT of the abdomen on RUQ ultrasound.    AKI (acute kidney injury) - Likely prerenal - Resolved with IV fluids.  History of irritable bowel syndrome - Stable. - Continue Librax  History of alcohol use - No signs of withdrawal. Continue to monitor. On Ativan when necessary.  History of hypertension - BP creeping up, will restart Benicar  History of dyslipidemia - Triglycerides still elevated, but not in the range to cause pancreatitis. Resume [facility]. Watch LFTs.  Disposition: Remain inpatient  DVT Prophylaxis: Prophylactic Heparin  Code Status: Full code   Family Communication None at bedside  Procedures:  None  CONSULTS:  None  Time spent 40 minutes-which includes 50% of the time with face-to-face with patient/ family and coordinating care related to the above assessment and plan.    MEDICATIONS: Scheduled Meds: . clidinium-chlordiazePOXIDE  1 capsule Oral QAC breakfast  . folic acid  1 mg Oral Daily  . heparin  5,000 Units Subcutaneous 3 times per day  . multivitamin with minerals  1 tablet Oral Daily  . pantoprazole  40 mg Oral Daily  . thiamine  100 mg Oral Daily   Or  . thiamine  100 mg Intravenous Daily   Continuous Infusions: . sodium chloride 125 mL/hr at 07/23/14 0728   PRN Meds:.hydrALAZINE, HYDROmorphone (DILAUDID) injection, LORazepam, LORazepam, ondansetron (ZOFRAN) IV, temazepam  Antibiotics: Anti-infectives   None       PHYSICAL EXAM: Vital signs in last 24 hours: Filed Vitals:   07/22/14 2236 07/23/14 0114 07/23/14 0200 07/23/14 0602  BP:  161/93 140/85 146/81  Pulse:  98 103 91  Temp: 98.6 F (37 C)     TempSrc: Oral     Resp: 18     Height:      Weight:      SpO2:        Weight change:  Filed Weights   07/22/14 1511  Weight: 102.15 kg (225 lb 3.2 oz)   Body mass index is 30.54 kg/(m^2).   Gen Exam: Awake and alert with clear speech.   Neck: Supple, No JVD.   Chest: B/L Clear.   CVS: S1 S2 Regular, no murmurs.  Abdomen: soft, BS +, +epigastric tenderness, non distended.  Extremities: no edema, lower extremities warm to touch. Neurologic: Non Focal.   Skin: No Rash.   Wounds: N/A.   Intake/Output from previous day:  Intake/Output Summary (Last 24 hours) at 07/23/14 1138 Last data filed at 07/23/14 0745  Gross per 24 hour  Intake   2025 ml  Output      0 ml  Net   2025 ml     LAB RESULTS: CBC  Recent Labs Lab 07/22/14 0905 07/22/14 0926 07/23/14 0635  WBC 4.8  --  10.5  HGB 12.7* 13.6 11.8*  HCT 38.2* 40.0 35.6*  PLT 322  --  296  MCV 88.6  --  89.0  MCH 29.5  --  29.5  MCHC 33.2  --  33.1  RDW 12.5  --  12.6    Chemistries   Recent Labs Lab 07/22/14 0905 07/22/14 0926 07/23/14 0635  NA 142 139 140  K 4.1 3.8 4.4  CL 104 107 101  CO2 24  --  25  GLUCOSE 110* 111* 109*  BUN 16 17 16   CREATININE 1.37* 1.40* 1.06  CALCIUM 9.2  --  8.0*    CBG: No results found for this basename: GLUCAP,  in the last 168 hours  GFR Estimated Creatinine Clearance: 116.8 ml/min (by C-G formula based on Cr of 1.06).  Coagulation profile No results found for this basename: INR, PROTIME,  in  the last 168 hours  Cardiac Enzymes No results found for this basename: CK, CKMB, TROPONINI, MYOGLOBIN,  in the last 168 hours  No components found with this basename: POCBNP,  No results found for this basename: DDIMER,  in the last 72 hours No results found for this basename: HGBA1C,  in the last 72 hours  Recent Labs  07/22/14 0905  CHOL 198  HDL 38*  LDLCALC 111*  TRIG 246*  CHOLHDL 5.2   No results found for this basename: TSH, T4TOTAL, FREET3, T3FREE, THYROIDAB,  in the last 72 hours No results found for this basename: VITAMINB12, FOLATE, FERRITIN, TIBC, IRON, RETICCTPCT,  in the last 72 hours  Recent Labs  07/22/14 0905 07/23/14 0635  LIPASE 1358* 505*    Urine Studies No results found for this basename: UACOL, UAPR, USPG, UPH, UTP, UGL, UKET, UBIL, UHGB, UNIT, UROB, ULEU, UEPI, UWBC, URBC, UBAC, CAST, CRYS, UCOM, BILUA,  in the last 72 hours  MICROBIOLOGY: No results found for this or any previous visit (from the past 240 hour(s)).  RADIOLOGY STUDIES/RESULTS: Ct Angio Chest Pe W/cm &/or Wo Cm  07/22/2014   CLINICAL DATA:  Chest pain and epigastric pain. Question aortic dissection. Shortness of breath.  EXAM: CT ANGIOGRAPHY CHEST, ABDOMEN AND PELVIS  TECHNIQUE: Multidetector CT imaging through the chest, abdomen and pelvis was performed using the standard protocol during bolus administration of intravenous contrast. Multiplanar reconstructed images and MIPs were obtained and reviewed to evaluate the vascular anatomy.  CONTRAST:  17mL OMNIPAQUE IOHEXOL 350 MG/ML SOLN  COMPARISON:  None.  FINDINGS: CTA CHEST FINDINGS  No pathologically enlarged mediastinal, hilar or axillary lymph nodes. No evidence of aortic dissection. Heart is mildly enlarged. No pericardial effusion.  Minimal dependent atelectasis in the lungs. Added density throughout the lungs is indicative of expiratory phase imaging. No pleural fluid. Airway is unremarkable.  Review of the MIP images confirms the  above findings.  CTA ABDOMEN AND PELVIS FINDINGS  Liver is decreased in attenuation diffusely. Liver, gallbladder and adrenal glands are otherwise unremarkable. A 1.9 cm low-attenuation lesion in the interpolar right kidney measures 18 Hounsfield units. Kidneys and spleen are otherwise unremarkable.  There are inflammatory changes and fluid around the pancreas which appears slightly enlarged. Fluid extends along the right paracolic gutter. Pancreas is uniform in attenuation without evidence of necrosis.  Stomach is unremarkable. There may be mild duodenal wall thickening, secondarily. Small bowel and colon are otherwise unremarkable. Prostate and bladder are unremarkable. No pathologically enlarged lymph nodes. No pelvic free fluid.  Vague low attenuation is seen within the superior mesenteric vein with fairly uniform opacification of the  splenic and portal veins. Vascular structures are otherwise grossly patent. No evidence of aortic dissection. No worrisome lytic or sclerotic lesions. Scattered bone islands.  Review of the MIP images confirms the above findings.  IMPRESSION: 1. No evidence of aortic dissection. 2. Acute pancreatitis. 3. Vague low attenuation in the superior mesenteric vein may be due to mixing artifact. Difficult to definitively exclude thrombus. 4. Marked hepatic steatosis. 5. Low-attenuation lesion in the right kidney is likely a cyst although difficult to definitively characterize without precontrast imaging. Further evaluation with nonemergent pre and post contrast MRI should be considered. Pre and post contrast CT could alternatively be performed, but would likely be of decreased accuracy given lesion size.   Electronically Signed   By: Lorin Picket M.D.   On: 07/22/2014 10:22   Dg Abd Acute W/chest  07/22/2014   CLINICAL DATA:  Sudden onset epigastric and mid abdominal pain.  EXAM: ACUTE ABDOMEN SERIES (ABDOMEN 2 VIEW & CHEST 1 VIEW)  COMPARISON:  None.  FINDINGS: Frontal view of the  chest shows midline trachea. Heart size normal. Lungs are somewhat low in volume but clear. No pleural fluid.  Two views of the abdomen showed a fair amount of stool in the ascending colon. No small bowel dilatation. No unexpected radiopaque calculi.  IMPRESSION: 1. No acute findings in the chest. 2. Question mild constipation.   Electronically Signed   By: Lorin Picket M.D.   On: 07/22/2014 09:34   Ct Cta Abd/pel W/cm &/or W/o Cm  07/22/2014   CLINICAL DATA:  Chest pain and epigastric pain. Question aortic dissection. Shortness of breath.  EXAM: CT ANGIOGRAPHY CHEST, ABDOMEN AND PELVIS  TECHNIQUE: Multidetector CT imaging through the chest, abdomen and pelvis was performed using the standard protocol during bolus administration of intravenous contrast. Multiplanar reconstructed images and MIPs were obtained and reviewed to evaluate the vascular anatomy.  CONTRAST:  135mL OMNIPAQUE IOHEXOL 350 MG/ML SOLN  COMPARISON:  None.  FINDINGS: CTA CHEST FINDINGS  No pathologically enlarged mediastinal, hilar or axillary lymph nodes. No evidence of aortic dissection. Heart is mildly enlarged. No pericardial effusion.  Minimal dependent atelectasis in the lungs. Added density throughout the lungs is indicative of expiratory phase imaging. No pleural fluid. Airway is unremarkable.  Review of the MIP images confirms the above findings.  CTA ABDOMEN AND PELVIS FINDINGS  Liver is decreased in attenuation diffusely. Liver, gallbladder and adrenal glands are otherwise unremarkable. A 1.9 cm low-attenuation lesion in the interpolar right kidney measures 18 Hounsfield units. Kidneys and spleen are otherwise unremarkable.  There are inflammatory changes and fluid around the pancreas which appears slightly enlarged. Fluid extends along the right paracolic gutter. Pancreas is uniform in attenuation without evidence of necrosis.  Stomach is unremarkable. There may be mild duodenal wall thickening, secondarily. Small bowel and colon  are otherwise unremarkable. Prostate and bladder are unremarkable. No pathologically enlarged lymph nodes. No pelvic free fluid.  Vague low attenuation is seen within the superior mesenteric vein with fairly uniform opacification of the splenic and portal veins. Vascular structures are otherwise grossly patent. No evidence of aortic dissection. No worrisome lytic or sclerotic lesions. Scattered bone islands.  Review of the MIP images confirms the above findings.  IMPRESSION: 1. No evidence of aortic dissection. 2. Acute pancreatitis. 3. Vague low attenuation in the superior mesenteric vein may be due to mixing artifact. Difficult to definitively exclude thrombus. 4. Marked hepatic steatosis. 5. Low-attenuation lesion in the right kidney is likely a cyst although difficult to  definitively characterize without precontrast imaging. Further evaluation with nonemergent pre and post contrast MRI should be considered. Pre and post contrast CT could alternatively be performed, but would likely be of decreased accuracy given lesion size.   Electronically Signed   By: Lorin Picket M.D.   On: 07/22/2014 10:22   US Abdomen Limited Ruq  07/22/2014   CLINICAL DATA:  Acute pancreatitis.  EXAM: US ABDOMEN LIMITED - RIGHT UPPER QUADRANT  COMPARISON:  CT abdomen pelvis 07/22/2014.  FINDINGS: Gallbladder:  No gallstones or wall thickening visualized. No sonographic Murphy sign noted.  Common bile duct:  Diameter: 4 mm, within normal limits.  Liver:  Diffusely increased in echogenicity.  IMPRESSION: Fatty liver.   Electronically Signed   By: Lorin Picket M.D.   On: 07/22/2014 12:01    Oren Binet, MD  [facility] Pager:[phone]  If 7PM-7AM, please contact night-coverage [email or web address] Password [email or web address] 07/23/2014, 11:38 AM   LOS: 1 day   **Disclaimer: This note may have been dictated with voice recognition software. Similar sounding words can inadvertently be transcribed and this note may contain  transcription errors which may not have been corrected upon publication of note.**

## 2014-07-24 ENCOUNTER — Ambulatory Visit: Payer: 59 | Admitting: Family Medicine

## 2014-07-24 LAB — CBC
HCT: 31.5 % — ABNORMAL LOW (ref 39.0–52.0)
Hemoglobin: 10.5 g/dL — ABNORMAL LOW (ref 13.0–17.0)
MCH: 29.7 pg (ref 26.0–34.0)
MCHC: 33.3 g/dL (ref 30.0–36.0)
MCV: 89.2 fL (ref 78.0–100.0)
Platelets: 276 10*3/uL (ref 150–400)
RBC: 3.53 MIL/uL — ABNORMAL LOW (ref 4.22–5.81)
RDW: 12.6 % (ref 11.5–15.5)
WBC: 10 10*3/uL (ref 4.0–10.5)

## 2014-07-24 LAB — CBC WITH DIFFERENTIAL/PLATELET
BASOS ABS: 0 10*3/uL (ref 0.0–0.1)
Basophils Relative: 0 % (ref 0–1)
EOS ABS: 0.1 10*3/uL (ref 0.0–0.7)
EOS PCT: 1 % (ref 0–5)
HEMATOCRIT: 31.8 % — AB (ref 39.0–52.0)
Hemoglobin: 10.6 g/dL — ABNORMAL LOW (ref 13.0–17.0)
Lymphocytes Relative: 12 % (ref 12–46)
Lymphs Abs: 1.2 10*3/uL (ref 0.7–4.0)
MCH: 29.5 pg (ref 26.0–34.0)
MCHC: 33.3 g/dL (ref 30.0–36.0)
MCV: 88.6 fL (ref 78.0–100.0)
MONO ABS: 0.7 10*3/uL (ref 0.1–1.0)
Monocytes Relative: 7 % (ref 3–12)
Neutro Abs: 7.9 10*3/uL — ABNORMAL HIGH (ref 1.7–7.7)
Neutrophils Relative %: 80 % — ABNORMAL HIGH (ref 43–77)
Platelets: 259 10*3/uL (ref 150–400)
RBC: 3.59 MIL/uL — ABNORMAL LOW (ref 4.22–5.81)
RDW: 12.6 % (ref 11.5–15.5)
WBC: 9.9 10*3/uL (ref 4.0–10.5)

## 2014-07-24 LAB — URINALYSIS, ROUTINE W REFLEX MICROSCOPIC
Bilirubin Urine: NEGATIVE
Glucose, UA: NEGATIVE mg/dL
Hgb urine dipstick: NEGATIVE
KETONES UR: NEGATIVE mg/dL
Nitrite: NEGATIVE
PROTEIN: NEGATIVE mg/dL
Specific Gravity, Urine: 1.02 (ref 1.005–1.030)
UROBILINOGEN UA: 1 mg/dL (ref 0.0–1.0)
pH: 6.5 (ref 5.0–8.0)

## 2014-07-24 LAB — COMPREHENSIVE METABOLIC PANEL
ALT: 48 U/L (ref 0–53)
ALT: 53 U/L (ref 0–53)
AST: 22 U/L (ref 0–37)
AST: 22 U/L (ref 0–37)
Albumin: 3.1 g/dL — ABNORMAL LOW (ref 3.5–5.2)
Albumin: 3.1 g/dL — ABNORMAL LOW (ref 3.5–5.2)
Alkaline Phosphatase: 30 U/L — ABNORMAL LOW (ref 39–117)
Alkaline Phosphatase: 34 U/L — ABNORMAL LOW (ref 39–117)
Anion gap: 13 (ref 5–15)
Anion gap: 13 (ref 5–15)
BUN: 11 mg/dL (ref 6–23)
BUN: 12 mg/dL (ref 6–23)
CALCIUM: 8 mg/dL — AB (ref 8.4–10.5)
CO2: 22 meq/L (ref 19–32)
CO2: 22 meq/L (ref 19–32)
CREATININE: 0.97 mg/dL (ref 0.50–1.35)
Calcium: 7.8 mg/dL — ABNORMAL LOW (ref 8.4–10.5)
Chloride: 102 mEq/L (ref 96–112)
Chloride: 103 mEq/L (ref 96–112)
Creatinine, Ser: 0.95 mg/dL (ref 0.50–1.35)
GFR calc Af Amer: >90 mL/min (ref >90)
GLUCOSE: 101 mg/dL — AB (ref 70–99)
GLUCOSE: 98 mg/dL (ref 70–99)
Potassium: 3.3 mEq/L — ABNORMAL LOW (ref 3.7–5.3)
Potassium: 3.7 mEq/L (ref 3.7–5.3)
SODIUM: 138 meq/L (ref 137–147)
Sodium: 137 mEq/L (ref 137–147)
Total Bilirubin: 1.1 mg/dL (ref 0.3–1.2)
Total Bilirubin: 1.1 mg/dL (ref 0.3–1.2)
Total Protein: 6.2 g/dL (ref 6.0–8.3)
Total Protein: 6.5 g/dL (ref 6.0–8.3)

## 2014-07-24 LAB — URINE MICROSCOPIC-ADD ON

## 2014-07-24 LAB — LIPASE, BLOOD
Lipase: 192 U/L — ABNORMAL HIGH (ref 11–59)
Lipase: 448 U/L — ABNORMAL HIGH (ref 11–59)

## 2014-07-24 MED ORDER — ACETAMINOPHEN 325 MG PO TABS
650.0000 mg | ORAL_TABLET | Freq: Four times a day (QID) | ORAL | Status: DC | PRN
  Administered 2014-07-24 – 2014-07-26 (×3): 650 mg via ORAL
  Filled 2014-07-24 (×3): qty 2

## 2014-07-24 MED ORDER — POTASSIUM CHLORIDE 10 MEQ/100ML IV SOLN
10.0000 meq | INTRAVENOUS | Status: AC
  Administered 2014-07-24 (×2): 10 meq via INTRAVENOUS
  Filled 2014-07-24 (×2): qty 100

## 2014-07-24 NOTE — ED Provider Notes (Signed)
I saw and evaluated the patient, reviewed the resident's note and I agree with the findings and plan.   EKG Interpretation   Date/Time:  Wednesday July 22 2014 08:33:09 EDT Ventricular Rate:  78 PR Interval:  156 QRS Duration: 86 QT Interval:  364 QTC Calculation: 414 R Axis:   65 Text Interpretation:  Normal sinus rhythm Possible Anterior infarct , age  undetermined Abnormal ECG No significant change since last tracing  Confirmed by YAO  MD, DAVID ([id number]) on 07/22/2014 9:02:11 AM      Christopher [name] is a 38 y.o. male hx of reflux here with back pain, chest pain started last night. Sharp, associated with diaphoresis. May have drank some alcohol as well. On exam, diaphoretic, in mod distress. Hypertensive. Heart and lung exam unremarkable. + epigastric tenderness. I was concerned for possible dissection. CT angio showed no dissection but has pancreatitis. Lipase 1400. Patient admitted for IVF and pain control.     Wandra Arthurs, MD 07/24/14 Drema Halon

## 2014-07-24 NOTE — Progress Notes (Signed)
PATIENT DETAILS Name: Christopher [name] Age: 38 y.o. Sex: male Date of Birth: 1976-06-16 Admit Date: 07/22/2014 Admitting Physician Geradine Girt, DO [id number] [name], MD  Subjective: Febrile last night-but claims pain slowly easing off-pain significantly better than what it was on admission.  Assessment/Plan: Active Problems: Acute pancreatitis - Likely alcoholic- given significant alcohol intake this past Sunday. -CT of the abdomen shows pancreatic inflammation. No evidence of gallstones seen on CT of the abdomen or RUQ ultrasound. - Since clinically improved, with significantly less pain, will start clear liquids. Continue supportive care with IV fluids, as needed narcotics.     TRANSAMINASES, SERUM, ELEVATED - Likely secondary to alcohol intake. No evidence of gallstones seen on CT of the abdomen on RUQ ultrasound. -down trending-will await repeat labs    AKI (acute kidney injury) - Likely prerenal - Resolved with IV fluids.  History of irritable bowel syndrome - Stable. - Continue Librax  History of alcohol use - No signs of withdrawal. Continue to monitor. On Ativan when necessary.  History of hypertension - BP creeping up, will restart Benicar in the next few days  History of dyslipidemia - Triglycerides still elevated, but not in the range to cause pancreatitis. Resume [facility] in the next few days  Disposition: Remain inpatient  DVT Prophylaxis: Prophylactic Heparin  Code Status: Full code   Family Communication None at bedside  Procedures:  None  CONSULTS:  None  Time spent 40 minutes-which includes 50% of the time with face-to-face with patient/ family and coordinating care related to the above assessment and plan.   MEDICATIONS: Scheduled Meds: . clidinium-chlordiazePOXIDE  1 capsule Oral QAC breakfast  . folic acid  1 mg Oral Daily  . heparin  5,000 Units Subcutaneous 3 times per day  . multivitamin with minerals  1 tablet  Oral Daily  . pantoprazole  40 mg Oral Daily  . thiamine  100 mg Oral Daily   Or  . thiamine  100 mg Intravenous Daily   Continuous Infusions: . sodium chloride 125 mL/hr at 07/23/14 1511   PRN Meds:.hydrALAZINE, HYDROmorphone (DILAUDID) injection, LORazepam, LORazepam, ondansetron (ZOFRAN) IV, temazepam  Antibiotics: Anti-infectives   None       PHYSICAL EXAM: Vital signs in last 24 hours: Filed Vitals:   07/23/14 1813 07/23/14 2218 07/24/14 0029 07/24/14 0420  BP: 128/79 115/59  123/72  Pulse: 99 99  96  Temp: 99 F (37.2 C) 102.4 F (39.1 C) 99.9 F (37.7 C) 98.7 F (37.1 C)  TempSrc: Oral Oral Oral Oral  Resp: 18 20  18   Height:      Weight:      SpO2: 95% 95%  96%    Weight change:  Filed Weights   07/22/14 1511  Weight: 102.15 kg (225 lb 3.2 oz)   Body mass index is 30.54 kg/(m^2).   Gen Exam: Awake and alert with clear speech.   Neck: Supple, No JVD.   Chest: B/L Clear.   CVS: S1 S2 Regular, no murmurs.  Abdomen: soft, BS +, mild epigastric tenderness-significantly less tender than yesterday, non distended.  Extremities: no edema, lower extremities warm to touch. Neurologic: Non Focal.   Skin: No Rash.   Wounds: N/A.   Intake/Output from previous day:  Intake/Output Summary (Last 24 hours) at 07/24/14 0835 Last data filed at 07/23/14 1511  Gross per 24 hour  Intake 929.17 ml  Output      0 ml  Net 929.17 ml  LAB RESULTS: CBC  Recent Labs Lab 07/22/14 0905 07/22/14 0926 07/23/14 0635 07/23/14 2357  WBC 4.8  --  10.5 9.9  HGB 12.7* 13.6 11.8* 10.6*  HCT 38.2* 40.0 35.6* 31.8*  PLT 322  --  296 259  MCV 88.6  --  89.0 88.6  MCH 29.5  --  29.5 29.5  MCHC 33.2  --  33.1 33.3  RDW 12.5  --  12.6 12.6  LYMPHSABS  --   --   --  1.2  MONOABS  --   --   --  0.7  EOSABS  --   --   --  0.1  BASOSABS  --   --   --  0.0    Chemistries   Recent Labs Lab 07/22/14 0905 07/22/14 0926 07/23/14 0635 07/23/14 2357  NA 142 139 140 138   K 4.1 3.8 4.4 3.3*  CL 104 107 101 103  CO2 24  --  25 22  GLUCOSE 110* 111* 109* 98  BUN 16 17 16 12   CREATININE 1.37* 1.40* 1.06 0.97  CALCIUM 9.2  --  8.0* 7.8*    CBG: No results found for this basename: GLUCAP,  in the last 168 hours  GFR Estimated Creatinine Clearance: 127.6 ml/min (by C-G formula based on Cr of 0.97).  Coagulation profile No results found for this basename: INR, PROTIME,  in the last 168 hours  Cardiac Enzymes No results found for this basename: CK, CKMB, TROPONINI, MYOGLOBIN,  in the last 168 hours  No components found with this basename: POCBNP,  No results found for this basename: DDIMER,  in the last 72 hours No results found for this basename: HGBA1C,  in the last 72 hours  Recent Labs  07/22/14 0905  CHOL 198  HDL 38*  LDLCALC 111*  TRIG 246*  CHOLHDL 5.2   No results found for this basename: TSH, T4TOTAL, FREET3, T3FREE, THYROIDAB,  in the last 72 hours No results found for this basename: VITAMINB12, FOLATE, FERRITIN, TIBC, IRON, RETICCTPCT,  in the last 72 hours  Recent Labs  07/23/14 0635 07/23/14 2357  LIPASE 505* 448*    Urine Studies No results found for this basename: UACOL, UAPR, USPG, UPH, UTP, UGL, UKET, UBIL, UHGB, UNIT, UROB, ULEU, UEPI, UWBC, URBC, UBAC, CAST, CRYS, UCOM, BILUA,  in the last 72 hours  MICROBIOLOGY: No results found for this or any previous visit (from the past 240 hour(s)).  RADIOLOGY STUDIES/RESULTS: Ct Angio Chest Pe W/cm &/or Wo Cm  07/22/2014   CLINICAL DATA:  Chest pain and epigastric pain. Question aortic dissection. Shortness of breath.  EXAM: CT ANGIOGRAPHY CHEST, ABDOMEN AND PELVIS  TECHNIQUE: Multidetector CT imaging through the chest, abdomen and pelvis was performed using the standard protocol during bolus administration of intravenous contrast. Multiplanar reconstructed images and MIPs were obtained and reviewed to evaluate the vascular anatomy.  CONTRAST:  158mL OMNIPAQUE IOHEXOL 350 MG/ML  SOLN  COMPARISON:  None.  FINDINGS: CTA CHEST FINDINGS  No pathologically enlarged mediastinal, hilar or axillary lymph nodes. No evidence of aortic dissection. Heart is mildly enlarged. No pericardial effusion.  Minimal dependent atelectasis in the lungs. Added density throughout the lungs is indicative of expiratory phase imaging. No pleural fluid. Airway is unremarkable.  Review of the MIP images confirms the above findings.  CTA ABDOMEN AND PELVIS FINDINGS  Liver is decreased in attenuation diffusely. Liver, gallbladder and adrenal glands are otherwise unremarkable. A 1.9 cm low-attenuation lesion in the interpolar right kidney measures  18 Hounsfield units. Kidneys and spleen are otherwise unremarkable.  There are inflammatory changes and fluid around the pancreas which appears slightly enlarged. Fluid extends along the right paracolic gutter. Pancreas is uniform in attenuation without evidence of necrosis.  Stomach is unremarkable. There may be mild duodenal wall thickening, secondarily. Small bowel and colon are otherwise unremarkable. Prostate and bladder are unremarkable. No pathologically enlarged lymph nodes. No pelvic free fluid.  Vague low attenuation is seen within the superior mesenteric vein with fairly uniform opacification of the splenic and portal veins. Vascular structures are otherwise grossly patent. No evidence of aortic dissection. No worrisome lytic or sclerotic lesions. Scattered bone islands.  Review of the MIP images confirms the above findings.  IMPRESSION: 1. No evidence of aortic dissection. 2. Acute pancreatitis. 3. Vague low attenuation in the superior mesenteric vein may be due to mixing artifact. Difficult to definitively exclude thrombus. 4. Marked hepatic steatosis. 5. Low-attenuation lesion in the right kidney is likely a cyst although difficult to definitively characterize without precontrast imaging. Further evaluation with nonemergent pre and post contrast MRI should be  considered. Pre and post contrast CT could alternatively be performed, but would likely be of decreased accuracy given lesion size.   Electronically Signed   By: Lorin Picket M.D.   On: 07/22/2014 10:22   Dg Abd Acute W/chest  07/22/2014   CLINICAL DATA:  Sudden onset epigastric and mid abdominal pain.  EXAM: ACUTE ABDOMEN SERIES (ABDOMEN 2 VIEW & CHEST 1 VIEW)  COMPARISON:  None.  FINDINGS: Frontal view of the chest shows midline trachea. Heart size normal. Lungs are somewhat low in volume but clear. No pleural fluid.  Two views of the abdomen showed a fair amount of stool in the ascending colon. No small bowel dilatation. No unexpected radiopaque calculi.  IMPRESSION: 1. No acute findings in the chest. 2. Question mild constipation.   Electronically Signed   By: Lorin Picket M.D.   On: 07/22/2014 09:34   Ct Cta Abd/pel W/cm &/or W/o Cm  07/22/2014   CLINICAL DATA:  Chest pain and epigastric pain. Question aortic dissection. Shortness of breath.  EXAM: CT ANGIOGRAPHY CHEST, ABDOMEN AND PELVIS  TECHNIQUE: Multidetector CT imaging through the chest, abdomen and pelvis was performed using the standard protocol during bolus administration of intravenous contrast. Multiplanar reconstructed images and MIPs were obtained and reviewed to evaluate the vascular anatomy.  CONTRAST:  171mL OMNIPAQUE IOHEXOL 350 MG/ML SOLN  COMPARISON:  None.  FINDINGS: CTA CHEST FINDINGS  No pathologically enlarged mediastinal, hilar or axillary lymph nodes. No evidence of aortic dissection. Heart is mildly enlarged. No pericardial effusion.  Minimal dependent atelectasis in the lungs. Added density throughout the lungs is indicative of expiratory phase imaging. No pleural fluid. Airway is unremarkable.  Review of the MIP images confirms the above findings.  CTA ABDOMEN AND PELVIS FINDINGS  Liver is decreased in attenuation diffusely. Liver, gallbladder and adrenal glands are otherwise unremarkable. A 1.9 cm low-attenuation lesion  in the interpolar right kidney measures 18 Hounsfield units. Kidneys and spleen are otherwise unremarkable.  There are inflammatory changes and fluid around the pancreas which appears slightly enlarged. Fluid extends along the right paracolic gutter. Pancreas is uniform in attenuation without evidence of necrosis.  Stomach is unremarkable. There may be mild duodenal wall thickening, secondarily. Small bowel and colon are otherwise unremarkable. Prostate and bladder are unremarkable. No pathologically enlarged lymph nodes. No pelvic free fluid.  Vague low attenuation is seen within the superior mesenteric vein  with fairly uniform opacification of the splenic and portal veins. Vascular structures are otherwise grossly patent. No evidence of aortic dissection. No worrisome lytic or sclerotic lesions. Scattered bone islands.  Review of the MIP images confirms the above findings.  IMPRESSION: 1. No evidence of aortic dissection. 2. Acute pancreatitis. 3. Vague low attenuation in the superior mesenteric vein may be due to mixing artifact. Difficult to definitively exclude thrombus. 4. Marked hepatic steatosis. 5. Low-attenuation lesion in the right kidney is likely a cyst although difficult to definitively characterize without precontrast imaging. Further evaluation with nonemergent pre and post contrast MRI should be considered. Pre and post contrast CT could alternatively be performed, but would likely be of decreased accuracy given lesion size.   Electronically Signed   By: Lorin Picket M.D.   On: 07/22/2014 10:22   US Abdomen Limited Ruq  07/22/2014   CLINICAL DATA:  Acute pancreatitis.  EXAM: US ABDOMEN LIMITED - RIGHT UPPER QUADRANT  COMPARISON:  CT abdomen pelvis 07/22/2014.  FINDINGS: Gallbladder:  No gallstones or wall thickening visualized. No sonographic Murphy sign noted.  Common bile duct:  Diameter: 4 mm, within normal limits.  Liver:  Diffusely increased in echogenicity.  IMPRESSION: Fatty liver.    Electronically Signed   By: Lorin Picket M.D.   On: 07/22/2014 12:01    Oren Binet, MD  [facility] Pager:[phone]  If 7PM-7AM, please contact night-coverage [email or web address] Password [email or web address] 07/24/2014, 8:35 AM   LOS: 2 days   **Disclaimer: This note may have been dictated with voice recognition software. Similar sounding words can inadvertently be transcribed and this note may contain transcription errors which may not have been corrected upon publication of note.**

## 2014-07-25 LAB — COMPREHENSIVE METABOLIC PANEL
ALBUMIN: 3 g/dL — AB (ref 3.5–5.2)
ALT: 36 U/L (ref 0–53)
ANION GAP: 13 (ref 5–15)
AST: 19 U/L (ref 0–37)
Alkaline Phosphatase: 57 U/L (ref 39–117)
BUN: 10 mg/dL (ref 6–23)
CO2: 24 mEq/L (ref 19–32)
Calcium: 8.4 mg/dL (ref 8.4–10.5)
Chloride: 99 mEq/L (ref 96–112)
Creatinine, Ser: 0.96 mg/dL (ref 0.50–1.35)
GFR calc Af Amer: >90 mL/min (ref >90)
GFR calc non Af Amer: >90 mL/min (ref >90)
Glucose, Bld: 106 mg/dL — ABNORMAL HIGH (ref 70–99)
Potassium: 3.9 mEq/L (ref 3.7–5.3)
Sodium: 136 mEq/L — ABNORMAL LOW (ref 137–147)
TOTAL PROTEIN: 6.7 g/dL (ref 6.0–8.3)
Total Bilirubin: 0.6 mg/dL (ref 0.3–1.2)

## 2014-07-25 LAB — LIPASE, BLOOD: Lipase: 106 U/L — ABNORMAL HIGH (ref 11–59)

## 2014-07-25 MED ORDER — POLYETHYLENE GLYCOL 3350 17 G PO PACK
17.0000 g | PACK | Freq: Every day | ORAL | Status: DC
  Administered 2014-07-25 – 2014-07-26 (×2): 17 g via ORAL
  Filled 2014-07-25 (×3): qty 1

## 2014-07-25 MED ORDER — OXYCODONE HCL 5 MG PO TABS
5.0000 mg | ORAL_TABLET | ORAL | Status: DC | PRN
  Administered 2014-07-25 – 2014-07-26 (×4): 10 mg via ORAL
  Filled 2014-07-25 (×4): qty 2

## 2014-07-25 MED ORDER — SENNA 8.6 MG PO TABS
1.0000 | ORAL_TABLET | Freq: Every day | ORAL | Status: DC
  Administered 2014-07-25: 8.6 mg via ORAL
  Filled 2014-07-25 (×3): qty 1

## 2014-07-25 MED ORDER — HYDROMORPHONE HCL PF 1 MG/ML IJ SOLN: 1.0000 mg | INTRAMUSCULAR | Status: DC | PRN

## 2014-07-25 NOTE — Progress Notes (Signed)
PATIENT DETAILS Name: Christopher [name] Age: 38 y.o. Sex: male Date of Birth: 1976/06/05 Admit Date: 07/22/2014 Admitting Physician Geradine Girt, DO MLY:[id number] [name], MD  Subjective: Low grade fever, but significantly less abdominal pain. No nausea or vomiting. Tolerating clear liquids.  Assessment/Plan: Active Problems: Acute pancreatitis - Likely alcoholic- given significant alcohol intake this past Sunday. -CT of the abdomen shows pancreatic inflammation. No evidence of gallstones seen on CT of the abdomen or RUQ ultrasound. - Slowly improving clinically-hardly any epigastric tenderness on my exam today-(compared to just 2 days back)-will continue with clear liquids. Continue supportive care with IV fluids, as needed narcotics. Will add oxycodone for moderate pain, and change dilaudid for severe pain. No BM since Wednesday-start Miralax/Senokot.   Fever -suspect secondary to pancreatic inflammation. Blood culture on 8/27 pending,CXR 8/27 neg for PNA, UA neg for UTI.No other foci of infection apparent -follow fever trend/curve    TRANSAMINASES, SERUM, ELEVATED - Likely secondary to alcohol intake. No evidence of gallstones seen on CT of the abdomen on RUQ ultrasound. -resolved    AKI (acute kidney injury) - Likely prerenal - Resolved with IV fluids.  History of irritable bowel syndrome - Stable. - Continue Librax  History of alcohol use - No signs of withdrawal. Continue to monitor. On Ativan when necessary.  History of hypertension - BP creeping up, will restart Benicar in the next few days  History of dyslipidemia - Triglycerides still elevated, but not in the range to cause pancreatitis. Resume TriCor in the next few days  Disposition: Remain inpatient  DVT Prophylaxis: Prophylactic Heparin  Code Status: Full code   Family Communication None at bedside  Procedures:  None  CONSULTS:  None  MEDICATIONS: Scheduled Meds: .  clidinium-chlordiazePOXIDE  1 capsule Oral QAC breakfast  . folic acid  1 mg Oral Daily  . heparin  5,000 Units Subcutaneous 3 times per day  . multivitamin with minerals  1 tablet Oral Daily  . pantoprazole  40 mg Oral Daily  . thiamine  100 mg Oral Daily   Or  . thiamine  100 mg Intravenous Daily   Continuous Infusions: . sodium chloride 100 mL/hr at 07/24/14 2350   PRN Meds:.acetaminophen, hydrALAZINE, HYDROmorphone (DILAUDID) injection, LORazepam, LORazepam, ondansetron (ZOFRAN) IV, temazepam  Antibiotics: Anti-infectives   None       PHYSICAL EXAM: Vital signs in last 24 hours: Filed Vitals:   07/24/14 1513 07/24/14 1842 07/25/14 0013 07/25/14 0507  BP:  142/80 141/89 122/73  Pulse:  88 82 55  Temp: 98.5 F (36.9 C) 100.2 F (37.9 C) 99.5 F (37.5 C) 100.6 F (38.1 C)  TempSrc: Oral Oral Oral Oral  Resp:  20 20 20   Height:      Weight:      SpO2:  94% 98% 92%    Weight change:  Filed Weights   07/22/14 1511  Weight: 102.15 kg (225 lb 3.2 oz)   Body mass index is 30.54 kg/(m^2).   Gen Exam: Awake and alert with clear speech.   Neck: Supple, No JVD.   Chest: B/L Clear.   CVS: S1 S2 Regular, no murmurs.  Abdomen: soft, BS +, no epigastric tenderness this am, non distended.  Extremities: no edema, lower extremities warm to touch. Neurologic: Non Focal.   Skin: No Rash.   Wounds: N/A.   Intake/Output from previous day:  Intake/Output Summary (Last 24 hours) at 07/25/14 1000 Last data filed at 07/25/14 8127  Gross  per 24 hour  Intake 2955.33 ml  Output      0 ml  Net 2955.33 ml     LAB RESULTS: CBC  Recent Labs Lab 07/22/14 0905 07/22/14 0926 07/23/14 0635 07/23/14 2357 07/24/14 0830  WBC 4.8  --  10.5 9.9 10.0  HGB 12.7* 13.6 11.8* 10.6* 10.5*  HCT 38.2* 40.0 35.6* 31.8* 31.5*  PLT 322  --  296 259 276  MCV 88.6  --  89.0 88.6 89.2  MCH 29.5  --  29.5 29.5 29.7  MCHC 33.2  --  33.1 33.3 33.3  RDW 12.5  --  12.6 12.6 12.6  LYMPHSABS   --   --   --  1.2  --   MONOABS  --   --   --  0.7  --   EOSABS  --   --   --  0.1  --   BASOSABS  --   --   --  0.0  --     Chemistries   Recent Labs Lab 07/22/14 0905 07/22/14 0926 07/23/14 0635 07/23/14 2357 07/24/14 0830 07/25/14 0752  NA 142 139 140 138 137 136*  K 4.1 3.8 4.4 3.3* 3.7 3.9  CL 104 107 101 103 102 99  CO2 24  --  25 22 22 24   GLUCOSE 110* 111* 109* 98 101* 106*  BUN 16 17 16 12 11 10   CREATININE 1.37* 1.40* 1.06 0.97 0.95 0.96  CALCIUM 9.2  --  8.0* 7.8* 8.0* 8.4    CBG: No results found for this basename: GLUCAP,  in the last 168 hours  GFR Estimated Creatinine Clearance: 129 ml/min (by C-G formula based on Cr of 0.96).  Coagulation profile No results found for this basename: INR, PROTIME,  in the last 168 hours  Cardiac Enzymes No results found for this basename: CK, CKMB, TROPONINI, MYOGLOBIN,  in the last 168 hours  No components found with this basename: POCBNP,  No results found for this basename: DDIMER,  in the last 72 hours No results found for this basename: HGBA1C,  in the last 72 hours No results found for this basename: CHOL, HDL, LDLCALC, TRIG, CHOLHDL, LDLDIRECT,  in the last 72 hours No results found for this basename: TSH, T4TOTAL, FREET3, T3FREE, THYROIDAB,  in the last 72 hours No results found for this basename: VITAMINB12, FOLATE, FERRITIN, TIBC, IRON, RETICCTPCT,  in the last 72 hours  Recent Labs  07/24/14 0830 07/25/14 0752  LIPASE 192* 106*    Urine Studies No results found for this basename: UACOL, UAPR, USPG, UPH, UTP, UGL, UKET, UBIL, UHGB, UNIT, UROB, ULEU, UEPI, UWBC, URBC, UBAC, CAST, CRYS, UCOM, BILUA,  in the last 72 hours  MICROBIOLOGY: No results found for this or any previous visit (from the past 240 hour(s)).  RADIOLOGY STUDIES/RESULTS: Ct Angio Chest Pe W/cm &/or Wo Cm  07/22/2014   CLINICAL DATA:  Chest pain and epigastric pain. Question aortic dissection. Shortness of breath.  EXAM: CT ANGIOGRAPHY  CHEST, ABDOMEN AND PELVIS  TECHNIQUE: Multidetector CT imaging through the chest, abdomen and pelvis was performed using the standard protocol during bolus administration of intravenous contrast. Multiplanar reconstructed images and MIPs were obtained and reviewed to evaluate the vascular anatomy.  CONTRAST:  115mL OMNIPAQUE IOHEXOL 350 MG/ML SOLN  COMPARISON:  None.  FINDINGS: CTA CHEST FINDINGS  No pathologically enlarged mediastinal, hilar or axillary lymph nodes. No evidence of aortic dissection. Heart is mildly enlarged. No pericardial effusion.  Minimal dependent atelectasis in  the lungs. Added density throughout the lungs is indicative of expiratory phase imaging. No pleural fluid. Airway is unremarkable.  Review of the MIP images confirms the above findings.  CTA ABDOMEN AND PELVIS FINDINGS  Liver is decreased in attenuation diffusely. Liver, gallbladder and adrenal glands are otherwise unremarkable. A 1.9 cm low-attenuation lesion in the interpolar right kidney measures 18 Hounsfield units. Kidneys and spleen are otherwise unremarkable.  There are inflammatory changes and fluid around the pancreas which appears slightly enlarged. Fluid extends along the right paracolic gutter. Pancreas is uniform in attenuation without evidence of necrosis.  Stomach is unremarkable. There may be mild duodenal wall thickening, secondarily. Small bowel and colon are otherwise unremarkable. Prostate and bladder are unremarkable. No pathologically enlarged lymph nodes. No pelvic free fluid.  Vague low attenuation is seen within the superior mesenteric vein with fairly uniform opacification of the splenic and portal veins. Vascular structures are otherwise grossly patent. No evidence of aortic dissection. No worrisome lytic or sclerotic lesions. Scattered bone islands.  Review of the MIP images confirms the above findings.  IMPRESSION: 1. No evidence of aortic dissection. 2. Acute pancreatitis. 3. Vague low attenuation in the  superior mesenteric vein may be due to mixing artifact. Difficult to definitively exclude thrombus. 4. Marked hepatic steatosis. 5. Low-attenuation lesion in the right kidney is likely a cyst although difficult to definitively characterize without precontrast imaging. Further evaluation with nonemergent pre and post contrast MRI should be considered. Pre and post contrast CT could alternatively be performed, but would likely be of decreased accuracy given lesion size.   Electronically Signed   By: Lorin Picket M.D.   On: 07/22/2014 10:22   Dg Abd Acute W/chest  07/22/2014   CLINICAL DATA:  Sudden onset epigastric and mid abdominal pain.  EXAM: ACUTE ABDOMEN SERIES (ABDOMEN 2 VIEW & CHEST 1 VIEW)  COMPARISON:  None.  FINDINGS: Frontal view of the chest shows midline trachea. Heart size normal. Lungs are somewhat low in volume but clear. No pleural fluid.  Two views of the abdomen showed a fair amount of stool in the ascending colon. No small bowel dilatation. No unexpected radiopaque calculi.  IMPRESSION: 1. No acute findings in the chest. 2. Question mild constipation.   Electronically Signed   By: Lorin Picket M.D.   On: 07/22/2014 09:34   Ct Cta Abd/pel W/cm &/or W/o Cm  07/22/2014   CLINICAL DATA:  Chest pain and epigastric pain. Question aortic dissection. Shortness of breath.  EXAM: CT ANGIOGRAPHY CHEST, ABDOMEN AND PELVIS  TECHNIQUE: Multidetector CT imaging through the chest, abdomen and pelvis was performed using the standard protocol during bolus administration of intravenous contrast. Multiplanar reconstructed images and MIPs were obtained and reviewed to evaluate the vascular anatomy.  CONTRAST:  158mL OMNIPAQUE IOHEXOL 350 MG/ML SOLN  COMPARISON:  None.  FINDINGS: CTA CHEST FINDINGS  No pathologically enlarged mediastinal, hilar or axillary lymph nodes. No evidence of aortic dissection. Heart is mildly enlarged. No pericardial effusion.  Minimal dependent atelectasis in the lungs. Added  density throughout the lungs is indicative of expiratory phase imaging. No pleural fluid. Airway is unremarkable.  Review of the MIP images confirms the above findings.  CTA ABDOMEN AND PELVIS FINDINGS  Liver is decreased in attenuation diffusely. Liver, gallbladder and adrenal glands are otherwise unremarkable. A 1.9 cm low-attenuation lesion in the interpolar right kidney measures 18 Hounsfield units. Kidneys and spleen are otherwise unremarkable.  There are inflammatory changes and fluid around the pancreas which appears slightly  enlarged. Fluid extends along the right paracolic gutter. Pancreas is uniform in attenuation without evidence of necrosis.  Stomach is unremarkable. There may be mild duodenal wall thickening, secondarily. Small bowel and colon are otherwise unremarkable. Prostate and bladder are unremarkable. No pathologically enlarged lymph nodes. No pelvic free fluid.  Vague low attenuation is seen within the superior mesenteric vein with fairly uniform opacification of the splenic and portal veins. Vascular structures are otherwise grossly patent. No evidence of aortic dissection. No worrisome lytic or sclerotic lesions. Scattered bone islands.  Review of the MIP images confirms the above findings.  IMPRESSION: 1. No evidence of aortic dissection. 2. Acute pancreatitis. 3. Vague low attenuation in the superior mesenteric vein may be due to mixing artifact. Difficult to definitively exclude thrombus. 4. Marked hepatic steatosis. 5. Low-attenuation lesion in the right kidney is likely a cyst although difficult to definitively characterize without precontrast imaging. Further evaluation with nonemergent pre and post contrast MRI should be considered. Pre and post contrast CT could alternatively be performed, but would likely be of decreased accuracy given lesion size.   Electronically Signed   By: Lorin Picket M.D.   On: 07/22/2014 10:22   US Abdomen Limited Ruq  07/22/2014   CLINICAL DATA:  Acute  pancreatitis.  EXAM: US ABDOMEN LIMITED - RIGHT UPPER QUADRANT  COMPARISON:  CT abdomen pelvis 07/22/2014.  FINDINGS: Gallbladder:  No gallstones or wall thickening visualized. No sonographic Murphy sign noted.  Common bile duct:  Diameter: 4 mm, within normal limits.  Liver:  Diffusely increased in echogenicity.  IMPRESSION: Fatty liver.   Electronically Signed   By: Lorin Picket M.D.   On: 07/22/2014 12:01    Oren Binet, MD  [facility] Pager:[phone]  If 7PM-7AM, please contact night-coverage [email or web address] Password [email or web address] 07/25/2014, 10:00 AM   LOS: 3 days   **Disclaimer: This note may have been dictated with voice recognition software. Similar sounding words can inadvertently be transcribed and this note may contain transcription errors which may not have been corrected upon publication of note.**

## 2014-07-26 MED ORDER — IBUPROFEN 600 MG PO TABS
600.0000 mg | ORAL_TABLET | Freq: Four times a day (QID) | ORAL | Status: DC | PRN
  Administered 2014-07-26 – 2014-07-27 (×2): 600 mg via ORAL
  Filled 2014-07-26 (×2): qty 1

## 2014-07-26 NOTE — Progress Notes (Signed)
PATIENT DETAILS Name: Christopher [name] Age: 38 y.o. Sex: male Date of Birth: 09-13-1976 Admit Date: 07/22/2014 Admitting Physician Geradine Girt, DO [id number] [name], MD  Subjective: Hardly aby epigastric/abdominal pain-has low back pain (chronic issues-claims-"bed made it worse")  Assessment/Plan: Active Problems: Acute pancreatitis - Likely alcoholic- given significant alcohol intake this past Sunday. -CT of the abdomen shows pancreatic inflammation. No evidence of gallstones seen on CT of the abdomen or RUQ ultrasound. - Significantly improved, abdominal pain almost resolved, now with low back pain-which is a chronic issue for the patient-that seems to have worsened while in hospital. Tolerating clear liquids, advance to full liquids. Transitioned to oral narcotics-that patient is mostly taking for his low back pain. Have asked RN to try NSAID'S/Tylenol first, before narcotics. If continues to do well, will advance to soft diet in am and possible home on 8/31.  Fever -suspect secondary to pancreatic inflammation. Blood culture on 8/27 negative,CXR 8/27 neg for PNA, UA neg for UTI.No other foci of infection apparent -follow fever trend/curve    TRANSAMINASES, SERUM, ELEVATED - Likely secondary to alcohol intake. No evidence of gallstones seen on CT of the abdomen on RUQ ultrasound. -resolved    AKI (acute kidney injury) - Likely prerenal - Resolved with IV fluids.  History of irritable bowel syndrome - Stable. - Continue Librax  History of alcohol use - No signs of withdrawal. Continue to monitor. On Ativan when necessary.  History of hypertension - BP creeping up, will restart Benicar in the next few days  History of dyslipidemia - Triglycerides still elevated, but not in the range to cause pancreatitis. Resume TriCor in the next few days  Disposition: Remain inpatient-home in am-if tolerates soft diet  DVT Prophylaxis: Prophylactic Heparin  Code  Status: Full code   Family Communication None at bedside  Procedures:  None  CONSULTS:  None  MEDICATIONS: Scheduled Meds: . clidinium-chlordiazePOXIDE  1 capsule Oral QAC breakfast  . folic acid  1 mg Oral Daily  . heparin  5,000 Units Subcutaneous 3 times per day  . multivitamin with minerals  1 tablet Oral Daily  . pantoprazole  40 mg Oral Daily  . polyethylene glycol  17 g Oral Daily  . senna  1 tablet Oral QHS  . thiamine  100 mg Oral Daily   Continuous Infusions: . sodium chloride 100 mL/hr at 07/26/14 0619   PRN Meds:.acetaminophen, hydrALAZINE, HYDROmorphone (DILAUDID) injection, ibuprofen, ondansetron (ZOFRAN) IV, oxyCODONE, temazepam  Antibiotics: Anti-infectives   None       PHYSICAL EXAM: Vital signs in last 24 hours: Filed Vitals:   07/25/14 1409 07/25/14 2110 07/26/14 0001 07/26/14 0530  BP: 138/80 144/87 118/65 123/80  Pulse: 101 91 79 86  Temp: 98.8 F (37.1 C) 100.7 F (38.2 C) 99.4 F (37.4 C) 98.8 F (37.1 C)  TempSrc: Oral Oral Oral Oral  Resp: 20 18 19 18   Height:      Weight:      SpO2: 95% 96% 97% 96%    Weight change:  Filed Weights   07/22/14 1511  Weight: 102.15 kg (225 lb 3.2 oz)   Body mass index is 30.54 kg/(m^2).   Gen Exam: Awake and alert with clear speech.   Neck: Supple, No JVD.   Chest: B/L Clear. No rales or rhonchi CVS: S1 S2 Regular, no murmurs.  Abdomen: soft, BS +, no epigastric tenderness, non distended.  Extremities: no edema, lower extremities warm to touch. Neurologic: Non  Focal.   Skin: No Rash.   Wounds: N/A.   Intake/Output from previous day:  Intake/Output Summary (Last 24 hours) at 07/26/14 0929 Last data filed at 07/26/14 0647  Gross per 24 hour  Intake 1418.33 ml  Output      0 ml  Net 1418.33 ml     LAB RESULTS: CBC  Recent Labs Lab 07/22/14 0905 07/22/14 0926 07/23/14 0635 07/23/14 2357 07/24/14 0830  WBC 4.8  --  10.5 9.9 10.0  HGB 12.7* 13.6 11.8* 10.6* 10.5*  HCT 38.2*  40.0 35.6* 31.8* 31.5*  PLT 322  --  296 259 276  MCV 88.6  --  89.0 88.6 89.2  MCH 29.5  --  29.5 29.5 29.7  MCHC 33.2  --  33.1 33.3 33.3  RDW 12.5  --  12.6 12.6 12.6  LYMPHSABS  --   --   --  1.2  --   MONOABS  --   --   --  0.7  --   EOSABS  --   --   --  0.1  --   BASOSABS  --   --   --  0.0  --     Chemistries   Recent Labs Lab 07/22/14 0905 07/22/14 0926 07/23/14 0635 07/23/14 2357 07/24/14 0830 07/25/14 0752  NA 142 139 140 138 137 136*  K 4.1 3.8 4.4 3.3* 3.7 3.9  CL 104 107 101 103 102 99  CO2 24  --  25 22 22 24   GLUCOSE 110* 111* 109* 98 101* 106*  BUN 16 17 16 12 11 10   CREATININE 1.37* 1.40* 1.06 0.97 0.95 0.96  CALCIUM 9.2  --  8.0* 7.8* 8.0* 8.4    CBG: No results found for this basename: GLUCAP,  in the last 168 hours  GFR Estimated Creatinine Clearance: 129 ml/min (by C-G formula based on Cr of 0.96).  Coagulation profile No results found for this basename: INR, PROTIME,  in the last 168 hours  Cardiac Enzymes No results found for this basename: CK, CKMB, TROPONINI, MYOGLOBIN,  in the last 168 hours  No components found with this basename: POCBNP,  No results found for this basename: DDIMER,  in the last 72 hours No results found for this basename: HGBA1C,  in the last 72 hours No results found for this basename: CHOL, HDL, LDLCALC, TRIG, CHOLHDL, LDLDIRECT,  in the last 72 hours No results found for this basename: TSH, T4TOTAL, FREET3, T3FREE, THYROIDAB,  in the last 72 hours No results found for this basename: VITAMINB12, FOLATE, FERRITIN, TIBC, IRON, RETICCTPCT,  in the last 72 hours  Recent Labs  07/24/14 0830 07/25/14 0752  LIPASE 192* 106*    Urine Studies No results found for this basename: UACOL, UAPR, USPG, UPH, UTP, UGL, UKET, UBIL, UHGB, UNIT, UROB, ULEU, UEPI, UWBC, URBC, UBAC, CAST, CRYS, UCOM, BILUA,  in the last 72 hours  MICROBIOLOGY: Recent Results (from the past 240 hour(s))  CULTURE, BLOOD (ROUTINE X 2)     Status:  None   Collection Time    07/23/14 12:05 AM      Result Value Ref Range Status   Specimen Description BLOOD RIGHT HAND   Final   Special Requests     Final   Value: BOTTLES DRAWN AEROBIC AND ANAEROBIC 8CC AER,5CC ANA   Culture  Setup Time     Final   Value: 07/24/2014 03:26     Performed at [facility]  Final   Value:        BLOOD CULTURE RECEIVED NO GROWTH TO DATE CULTURE WILL BE HELD FOR 5 DAYS BEFORE ISSUING A FINAL NEGATIVE REPORT     Performed at [facility]   Report Status PENDING   Incomplete  CULTURE, BLOOD (ROUTINE X 2)     Status: None   Collection Time    07/23/14 11:57 PM      Result Value Ref Range Status   Specimen Description BLOOD RIGHT ARM   Final   Special Requests BOTTLES DRAWN AEROBIC AND ANAEROBIC 10CC   Final   Culture  Setup Time     Final   Value: 07/24/2014 03:26     Performed at [facility]   Culture     Final   Value:        BLOOD CULTURE RECEIVED NO GROWTH TO DATE CULTURE WILL BE HELD FOR 5 DAYS BEFORE ISSUING A FINAL NEGATIVE REPORT     Performed at [facility]   Report Status PENDING   Incomplete    RADIOLOGY STUDIES/RESULTS: Ct Angio Chest Pe W/cm &/or Wo Cm  07/22/2014   CLINICAL DATA:  Chest pain and epigastric pain. Question aortic dissection. Shortness of breath.  EXAM: CT ANGIOGRAPHY CHEST, ABDOMEN AND PELVIS  TECHNIQUE: Multidetector CT imaging through the chest, abdomen and pelvis was performed using the standard protocol during bolus administration of intravenous contrast. Multiplanar reconstructed images and MIPs were obtained and reviewed to evaluate the vascular anatomy.  CONTRAST:  155mL OMNIPAQUE IOHEXOL 350 MG/ML SOLN  COMPARISON:  None.  FINDINGS: CTA CHEST FINDINGS  No pathologically enlarged mediastinal, hilar or axillary lymph nodes. No evidence of aortic dissection. Heart is mildly enlarged. No pericardial effusion.  Minimal dependent atelectasis in the lungs. Added density throughout the  lungs is indicative of expiratory phase imaging. No pleural fluid. Airway is unremarkable.  Review of the MIP images confirms the above findings.  CTA ABDOMEN AND PELVIS FINDINGS  Liver is decreased in attenuation diffusely. Liver, gallbladder and adrenal glands are otherwise unremarkable. A 1.9 cm low-attenuation lesion in the interpolar right kidney measures 18 Hounsfield units. Kidneys and spleen are otherwise unremarkable.  There are inflammatory changes and fluid around the pancreas which appears slightly enlarged. Fluid extends along the right paracolic gutter. Pancreas is uniform in attenuation without evidence of necrosis.  Stomach is unremarkable. There may be mild duodenal wall thickening, secondarily. Small bowel and colon are otherwise unremarkable. Prostate and bladder are unremarkable. No pathologically enlarged lymph nodes. No pelvic free fluid.  Vague low attenuation is seen within the superior mesenteric vein with fairly uniform opacification of the splenic and portal veins. Vascular structures are otherwise grossly patent. No evidence of aortic dissection. No worrisome lytic or sclerotic lesions. Scattered bone islands.  Review of the MIP images confirms the above findings.  IMPRESSION: 1. No evidence of aortic dissection. 2. Acute pancreatitis. 3. Vague low attenuation in the superior mesenteric vein may be due to mixing artifact. Difficult to definitively exclude thrombus. 4. Marked hepatic steatosis. 5. Low-attenuation lesion in the right kidney is likely a cyst although difficult to definitively characterize without precontrast imaging. Further evaluation with nonemergent pre and post contrast MRI should be considered. Pre and post contrast CT could alternatively be performed, but would likely be of decreased accuracy given lesion size.   Electronically Signed   By: Lorin Picket M.D.   On: 07/22/2014 10:22   Dg Abd Acute W/chest  07/22/2014  CLINICAL DATA:  Sudden onset epigastric and  mid abdominal pain.  EXAM: ACUTE ABDOMEN SERIES (ABDOMEN 2 VIEW & CHEST 1 VIEW)  COMPARISON:  None.  FINDINGS: Frontal view of the chest shows midline trachea. Heart size normal. Lungs are somewhat low in volume but clear. No pleural fluid.  Two views of the abdomen showed a fair amount of stool in the ascending colon. No small bowel dilatation. No unexpected radiopaque calculi.  IMPRESSION: 1. No acute findings in the chest. 2. Question mild constipation.   Electronically Signed   By: Lorin Picket M.D.   On: 07/22/2014 09:34   Ct Cta Abd/pel W/cm &/or W/o Cm  07/22/2014   CLINICAL DATA:  Chest pain and epigastric pain. Question aortic dissection. Shortness of breath.  EXAM: CT ANGIOGRAPHY CHEST, ABDOMEN AND PELVIS  TECHNIQUE: Multidetector CT imaging through the chest, abdomen and pelvis was performed using the standard protocol during bolus administration of intravenous contrast. Multiplanar reconstructed images and MIPs were obtained and reviewed to evaluate the vascular anatomy.  CONTRAST:  172mL OMNIPAQUE IOHEXOL 350 MG/ML SOLN  COMPARISON:  None.  FINDINGS: CTA CHEST FINDINGS  No pathologically enlarged mediastinal, hilar or axillary lymph nodes. No evidence of aortic dissection. Heart is mildly enlarged. No pericardial effusion.  Minimal dependent atelectasis in the lungs. Added density throughout the lungs is indicative of expiratory phase imaging. No pleural fluid. Airway is unremarkable.  Review of the MIP images confirms the above findings.  CTA ABDOMEN AND PELVIS FINDINGS  Liver is decreased in attenuation diffusely. Liver, gallbladder and adrenal glands are otherwise unremarkable. A 1.9 cm low-attenuation lesion in the interpolar right kidney measures 18 Hounsfield units. Kidneys and spleen are otherwise unremarkable.  There are inflammatory changes and fluid around the pancreas which appears slightly enlarged. Fluid extends along the right paracolic gutter. Pancreas is uniform in attenuation  without evidence of necrosis.  Stomach is unremarkable. There may be mild duodenal wall thickening, secondarily. Small bowel and colon are otherwise unremarkable. Prostate and bladder are unremarkable. No pathologically enlarged lymph nodes. No pelvic free fluid.  Vague low attenuation is seen within the superior mesenteric vein with fairly uniform opacification of the splenic and portal veins. Vascular structures are otherwise grossly patent. No evidence of aortic dissection. No worrisome lytic or sclerotic lesions. Scattered bone islands.  Review of the MIP images confirms the above findings.  IMPRESSION: 1. No evidence of aortic dissection. 2. Acute pancreatitis. 3. Vague low attenuation in the superior mesenteric vein may be due to mixing artifact. Difficult to definitively exclude thrombus. 4. Marked hepatic steatosis. 5. Low-attenuation lesion in the right kidney is likely a cyst although difficult to definitively characterize without precontrast imaging. Further evaluation with nonemergent pre and post contrast MRI should be considered. Pre and post contrast CT could alternatively be performed, but would likely be of decreased accuracy given lesion size.   Electronically Signed   By: Lorin Picket M.D.   On: 07/22/2014 10:22   US Abdomen Limited Ruq  07/22/2014   CLINICAL DATA:  Acute pancreatitis.  EXAM: US ABDOMEN LIMITED - RIGHT UPPER QUADRANT  COMPARISON:  CT abdomen pelvis 07/22/2014.  FINDINGS: Gallbladder:  No gallstones or wall thickening visualized. No sonographic Murphy sign noted.  Common bile duct:  Diameter: 4 mm, within normal limits.  Liver:  Diffusely increased in echogenicity.  IMPRESSION: Fatty liver.   Electronically Signed   By: Lorin Picket M.D.   On: 07/22/2014 12:01    Oren Binet, MD  [facility] Pager:[phone]  If 7PM-7AM, please contact night-coverage [email or web address] Password [email or web address] 07/26/2014, 9:29 AM   LOS: 4 days   **Disclaimer: This note may have  been dictated with voice recognition software. Similar sounding words can inadvertently be transcribed and this note may contain transcription errors which may not have been corrected upon publication of note.**

## 2014-07-27 ENCOUNTER — Telehealth: Payer: Self-pay

## 2014-07-27 MED ORDER — ACETAMINOPHEN 325 MG PO TABS: 650.0000 mg | ORAL_TABLET | Freq: Four times a day (QID) | ORAL | Status: DC | PRN

## 2014-07-27 MED ORDER — IBUPROFEN 600 MG PO TABS: 600.0000 mg | ORAL_TABLET | Freq: Four times a day (QID) | ORAL | Status: DC | PRN

## 2014-07-27 NOTE — Progress Notes (Signed)
Nsg Discharge Note  Admit Date:  07/22/2014 Discharge date: 07/27/2014   Steadman Prosperi to be D/C'd Home per MD order.  AVS completed.  Copy for chart, and copy for patient signed, and dated. Patient/caregiver able to verbalize understanding.  Discharge Medication:   Medication List         acetaminophen 325 MG tablet  Commonly known as:  TYLENOL  Take 2 tablets (650 mg total) by mouth every 6 (six) hours as needed for fever or mild pain.     clidinium-chlordiazePOXIDE 5-2.5 MG per capsule  Commonly known as:  LIBRAX  Take 1 capsule by mouth daily.     fenofibrate 145 MG tablet  Commonly known as:  TRICOR  Take 145 mg by mouth daily.     ibuprofen 600 MG tablet  Commonly known as:  ADVIL,MOTRIN  Take 1 tablet (600 mg total) by mouth every 6 (six) hours as needed for mild pain.     MULTIVITAMIN PO  Take 1 tablet by mouth daily.     olmesartan 40 MG tablet  Commonly known as:  BENICAR  Take 40 mg by mouth daily.     pantoprazole 40 MG tablet  Commonly known as:  PROTONIX  Take 40 mg by mouth daily.     temazepam 15 MG capsule  Commonly known as:  RESTORIL  Take 1 capsule (15 mg total) by mouth at bedtime as needed for sleep.        Discharge Assessment: Filed Vitals:   07/27/14 0540  BP: 125/75  Pulse: 62  Temp: 97.8 F (36.6 C)  Resp: 16   Skin clean, dry and intact without evidence of skin break down, no evidence of skin tears noted. IV catheter discontinued intact. Site without signs and symptoms of complications - no redness or edema noted at insertion site, patient denies c/o pain - only slight tenderness at site.  Dressing with slight pressure applied.  D/c Instructions-Education: Discharge instructions given to patient/family with verbalized understanding. D/c education completed with patient/family including follow up instructions, medication list, d/c activities limitations if indicated, with other d/c instructions as indicated by MD - patient able  to verbalize understanding, all questions fully answered. Patient instructed to return to ED, call 911, or call MD for any changes in condition.  Patient escorted via [place], and D/C home via private auto.  Dayle Points, RN 07/27/2014 11:34 AM

## 2014-07-27 NOTE — Discharge Summary (Signed)
PATIENT DETAILS Name: Christopher [name] Age: 38 y.o. Sex: male Date of Birth: [birth year] MRN: [id number]. Admitting Physician: Geradine Girt, DO [id number] [name], MD  Admit Date: 07/22/2014 Discharge date: 07/27/2014  Recommendations for Outpatient Follow-up:  1. Gen. health maintenance 2. Encourage abstinence from alcohol 3. Refer to gastroenterology if needed-deferred to PCP  PRIMARY DISCHARGE DIAGNOSIS:  Active Problems:   TRANSAMINASES, SERUM, ELEVATED   Acute pancreatitis   AKI (acute kidney injury)   Alcohol abuse   Fatty liver      PAST MEDICAL HISTORY: Past Medical History  Diagnosis Date  . GERD (gastroesophageal reflux disease)   . Hyperlipidemia   . IBS (irritable bowel syndrome)   . Hypertension   . Pain in lower limb 03/02/2014  . 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 H/O hiatal hernia   . Migraine     "probably monthly" (07/22/2014)    DISCHARGE MEDICATIONS:   Medication List         acetaminophen 325 MG tablet  Commonly known as:  TYLENOL  Take 2 tablets (650 mg total) by mouth every 6 (six) hours as needed for fever or mild pain.     clidinium-chlordiazePOXIDE 5-2.5 MG per capsule  Commonly known as:  LIBRAX  Take 1 capsule by mouth daily.     fenofibrate 145 MG tablet  Commonly known as:  TRICOR  Take 145 mg by mouth daily.     ibuprofen 600 MG tablet  Commonly known as:  ADVIL,MOTRIN  Take 1 tablet (600 mg total) by mouth every 6 (six) hours as needed for mild pain.     MULTIVITAMIN PO  Take 1 tablet by mouth daily.     olmesartan 40 MG tablet  Commonly known as:  BENICAR  Take 40 mg by mouth daily.     pantoprazole 40 MG tablet  Commonly known as:  PROTONIX  Take 40 mg by mouth daily.     temazepam 15 MG capsule  Commonly known as:  RESTORIL  Take 1 capsule (15 mg total) by mouth at bedtime as needed for sleep.        ALLERGIES:  No Known Allergies  BRIEF HPI:  See H&P, Labs, Consult and Test reports for all details in brief,  patient is a 38 year old male with a history of hypertension, hypertriglyceridemia who was admitted for abdominal pain. Per patient, he drank much more alcohol than usual over the weekend prior to his admission during the golf tournament. He subsequently developed abdominal pain, further radiological studies revealed pancreatitis. Patient was admitted for further evaluation and&  CONSULTATIONS:   None  PERTINENT RADIOLOGIC STUDIES: Ct Angio Chest Pe W/cm &/or Wo Cm  07/22/2014   CLINICAL DATA:  Chest pain and epigastric pain. Question aortic dissection. Shortness of breath.  EXAM: CT ANGIOGRAPHY CHEST, ABDOMEN AND PELVIS  TECHNIQUE: Multidetector CT imaging through the chest, abdomen and pelvis was performed using the standard protocol during bolus administration of intravenous contrast. Multiplanar reconstructed images and MIPs were obtained and reviewed to evaluate the vascular anatomy.  CONTRAST:  147mL OMNIPAQUE IOHEXOL 350 MG/ML SOLN  COMPARISON:  None.  FINDINGS: CTA CHEST FINDINGS  No pathologically enlarged mediastinal, hilar or axillary lymph nodes. No evidence of aortic dissection. Heart is mildly enlarged. No pericardial effusion.  Minimal dependent atelectasis in the lungs. Added density throughout the lungs is indicative of expiratory phase imaging. No pleural fluid. Airway is unremarkable.  Review of the MIP images confirms the above findings.  CTA ABDOMEN AND PELVIS FINDINGS  Liver is decreased in attenuation diffusely. Liver, gallbladder and adrenal glands are otherwise unremarkable. A 1.9 cm low-attenuation lesion in the interpolar right kidney measures 18 Hounsfield units. Kidneys and spleen are otherwise unremarkable.  There are inflammatory changes and fluid around the pancreas which appears slightly enlarged. Fluid extends along the right paracolic gutter. Pancreas is uniform in attenuation without evidence of necrosis.  Stomach is unremarkable. There may be mild duodenal wall thickening,  secondarily. Small bowel and colon are otherwise unremarkable. Prostate and bladder are unremarkable. No pathologically enlarged lymph nodes. No pelvic free fluid.  Vague low attenuation is seen within the superior mesenteric vein with fairly uniform opacification of the splenic and portal veins. Vascular structures are otherwise grossly patent. No evidence of aortic dissection. No worrisome lytic or sclerotic lesions. Scattered bone islands.  Review of the MIP images confirms the above findings.  IMPRESSION: 1. No evidence of aortic dissection. 2. Acute pancreatitis. 3. Vague low attenuation in the superior mesenteric vein may be due to mixing artifact. Difficult to definitively exclude thrombus. 4. Marked hepatic steatosis. 5. Low-attenuation lesion in the right kidney is likely a cyst although difficult to definitively characterize without precontrast imaging. Further evaluation with nonemergent pre and post contrast MRI should be considered. Pre and post contrast CT could alternatively be performed, but would likely be of decreased accuracy given lesion size.   Electronically Signed   By: Lorin Picket M.D.   On: 07/22/2014 10:22   Dg Chest Port 1 View  07/24/2014   CLINICAL DATA:  Fever  EXAM: PORTABLE CHEST - 1 VIEW  COMPARISON:  Prior CT from earlier the same day.  FINDINGS: The cardiac and mediastinal silhouettes are stable in size and contour, and remain within normal limits.  The lungs are normally inflated. Minimal left perihilar atelectasis is noted. No airspace consolidation, pleural effusion, or pulmonary edema is identified. There is no pneumothorax.  No acute osseous abnormality identified.  IMPRESSION: No active cardiopulmonary disease.   Electronically Signed   By: Jeannine Boga M.D.   On: 07/24/2014 01:00   Dg Abd Acute W/chest  07/22/2014   CLINICAL DATA:  Sudden onset epigastric and mid abdominal pain.  EXAM: ACUTE ABDOMEN SERIES (ABDOMEN 2 VIEW & CHEST 1 VIEW)  COMPARISON:  None.   FINDINGS: Frontal view of the chest shows midline trachea. Heart size normal. Lungs are somewhat low in volume but clear. No pleural fluid.  Two views of the abdomen showed a fair amount of stool in the ascending colon. No small bowel dilatation. No unexpected radiopaque calculi.  IMPRESSION: 1. No acute findings in the chest. 2. Question mild constipation.   Electronically Signed   By: Lorin Picket M.D.   On: 07/22/2014 09:34   Ct Cta Abd/pel W/cm &/or W/o Cm  07/22/2014   CLINICAL DATA:  Chest pain and epigastric pain. Question aortic dissection. Shortness of breath.  EXAM: CT ANGIOGRAPHY CHEST, ABDOMEN AND PELVIS  TECHNIQUE: Multidetector CT imaging through the chest, abdomen and pelvis was performed using the standard protocol during bolus administration of intravenous contrast. Multiplanar reconstructed images and MIPs were obtained and reviewed to evaluate the vascular anatomy.  CONTRAST:  133mL OMNIPAQUE IOHEXOL 350 MG/ML SOLN  COMPARISON:  None.  FINDINGS: CTA CHEST FINDINGS  No pathologically enlarged mediastinal, hilar or axillary lymph nodes. No evidence of aortic dissection. Heart is mildly enlarged. No pericardial effusion.  Minimal dependent atelectasis in the lungs. Added density throughout the lungs is indicative of expiratory phase imaging. No pleural  fluid. Airway is unremarkable.  Review of the MIP images confirms the above findings.  CTA ABDOMEN AND PELVIS FINDINGS  Liver is decreased in attenuation diffusely. Liver, gallbladder and adrenal glands are otherwise unremarkable. A 1.9 cm low-attenuation lesion in the interpolar right kidney measures 18 Hounsfield units. Kidneys and spleen are otherwise unremarkable.  There are inflammatory changes and fluid around the pancreas which appears slightly enlarged. Fluid extends along the right paracolic gutter. Pancreas is uniform in attenuation without evidence of necrosis.  Stomach is unremarkable. There may be mild duodenal wall thickening,  secondarily. Small bowel and colon are otherwise unremarkable. Prostate and bladder are unremarkable. No pathologically enlarged lymph nodes. No pelvic free fluid.  Vague low attenuation is seen within the superior mesenteric vein with fairly uniform opacification of the splenic and portal veins. Vascular structures are otherwise grossly patent. No evidence of aortic dissection. No worrisome lytic or sclerotic lesions. Scattered bone islands.  Review of the MIP images confirms the above findings.  IMPRESSION: 1. No evidence of aortic dissection. 2. Acute pancreatitis. 3. Vague low attenuation in the superior mesenteric vein may be due to mixing artifact. Difficult to definitively exclude thrombus. 4. Marked hepatic steatosis. 5. Low-attenuation lesion in the right kidney is likely a cyst although difficult to definitively characterize without precontrast imaging. Further evaluation with nonemergent pre and post contrast MRI should be considered. Pre and post contrast CT could alternatively be performed, but would likely be of decreased accuracy given lesion size.   Electronically Signed   By: Lorin Picket M.D.   On: 07/22/2014 10:22   US Abdomen Limited Ruq  07/22/2014   CLINICAL DATA:  Acute pancreatitis.  EXAM: US ABDOMEN LIMITED - RIGHT UPPER QUADRANT  COMPARISON:  CT abdomen pelvis 07/22/2014.  FINDINGS: Gallbladder:  No gallstones or wall thickening visualized. No sonographic Murphy sign noted.  Common bile duct:  Diameter: 4 mm, within normal limits.  Liver:  Diffusely increased in echogenicity.  IMPRESSION: Fatty liver.   Electronically Signed   By: Lorin Picket M.D.   On: 07/22/2014 12:01     PERTINENT LAB RESULTS: CBC: No results found for this basename: WBC, HGB, HCT, PLT,  in the last 72 hours CMET CMP     Component Value Date/Time   NA 136* 07/25/2014 0752   K 3.9 07/25/2014 0752   CL 99 07/25/2014 0752   CO2 24 07/25/2014 0752   GLUCOSE 106* 07/25/2014 0752   BUN 10 07/25/2014 0752    CREATININE 0.96 07/25/2014 0752   CALCIUM 8.4 07/25/2014 0752   PROT 6.7 07/25/2014 0752   ALBUMIN 3.0* 07/25/2014 0752   AST 19 07/25/2014 0752   ALT 36 07/25/2014 0752   ALKPHOS 57 07/25/2014 0752   BILITOT 0.6 07/25/2014 0752   GFRNONAA >90 07/25/2014 0752   GFRAA >90 07/25/2014 0752    GFR Estimated Creatinine Clearance: 129 ml/min (by C-G formula based on Cr of 0.96).  Recent Labs  07/25/14 0752  LIPASE 106*   No results found for this basename: CKTOTAL, CKMB, CKMBINDEX, TROPONINI,  in the last 72 hours No components found with this basename: POCBNP,  No results found for this basename: DDIMER,  in the last 72 hours No results found for this basename: HGBA1C,  in the last 72 hours No results found for this basename: CHOL, HDL, LDLCALC, TRIG, CHOLHDL, LDLDIRECT,  in the last 72 hours No results found for this basename: TSH, T4TOTAL, FREET3, T3FREE, THYROIDAB,  in the last 72 hours No results  found for this basename: VITAMINB12, FOLATE, FERRITIN, TIBC, IRON, RETICCTPCT,  in the last 72 hours Coags: No results found for this basename: PT, INR,  in the last 72 hours Microbiology: Recent Results (from the past 240 hour(s))  CULTURE, BLOOD (ROUTINE X 2)     Status: None   Collection Time    07/23/14 12:05 AM      Result Value Ref Range Status   Specimen Description BLOOD RIGHT HAND   Final   Special Requests     Final   Value: BOTTLES DRAWN AEROBIC AND ANAEROBIC 8CC AER,5CC ANA   Culture  Setup Time     Final   Value: 07/24/2014 03:26     Performed at [facility]   Culture     Final   Value:        BLOOD CULTURE RECEIVED NO GROWTH TO DATE CULTURE WILL BE HELD FOR 5 DAYS BEFORE ISSUING A FINAL NEGATIVE REPORT     Performed at [facility]   Report Status PENDING   Incomplete  CULTURE, BLOOD (ROUTINE X 2)     Status: None   Collection Time    07/23/14 11:57 PM      Result Value Ref Range Status   Specimen Description BLOOD RIGHT ARM   Final   Special Requests  BOTTLES DRAWN AEROBIC AND ANAEROBIC 10CC   Final   Culture  Setup Time     Final   Value: 07/24/2014 03:26     Performed at [facility]   Culture     Final   Value:        BLOOD CULTURE RECEIVED NO GROWTH TO DATE CULTURE WILL BE HELD FOR 5 DAYS BEFORE ISSUING A FINAL NEGATIVE REPORT     Performed at [facility]   Report Status PENDING   Incomplete     [place]:   Active Problems: Acute pancreatitis  - Likely alcoholic- given significant alcohol intake this past Sunday.  -CT of the abdomen shows pancreatic inflammation. No evidence of gallstones seen on CT of the abdomen or RUQ ultrasound. - Patient was admitted, kept n.p.o., IV fluids, given as needed narcotics. With these measures he slowly improved, he was then started on clear liquids and slowly advanced to a low-fat diet which he tolerated on 8/31 AM. He is completely pain free at the time of discharge, abdomen soft and nontender. He has tolerated a regular diet.No requirement of narcotics for the past 24 hours.Advised that patient needs to refrain from further alcohol use  Fever  -suspect secondary to pancreatic inflammation. Blood culture on 8/27 negative,CXR 8/27 neg for PNA, UA neg for UTI.No other foci of infection apparent  -follow fever trend/curve- afebrile for the past 24 hours   TRANSAMINASES, SERUM, ELEVATED  - Likely secondary to alcohol intake. No evidence of gallstones seen on CT of the abdomen on RUQ ultrasound.  -resolved   AKI (acute kidney injury)  - Likely prerenal  - Resolved with IV fluids.   History of irritable bowel syndrome  - Stable.  - Continue Librax   History of alcohol use  - No signs of withdrawal. He was placed on Ativan as needed during this hospital stay  History of hypertension  - BP creeping up, will restart Benicar on discharge   History of dyslipidemia  - Triglycerides still elevated, but not in the range to cause pancreatitis. Resume TriCor on discharge     TODAY-DAY OF DISCHARGE:  Subjective:  [facility] today has no headache,no chest abdominal pain,no new weakness tingling or numbness, feels much better wants to go home today.   Objective:   Blood pressure 125/75, pulse 62, temperature 97.8 F (36.6 C), temperature source Oral, resp. rate 16, height 6' (1.829 m), weight 102.15 kg (225 lb 3.2 oz), SpO2 99.00%.  Intake/Output Summary (Last 24 hours) at 07/27/14 1010 Last data filed at 07/27/14 0200  Gross per 24 hour  Intake   1115 ml  Output      0 ml  Net   1115 ml   Filed Weights   07/22/14 1511  Weight: 102.15 kg (225 lb 3.2 oz)    Exam Awake Alert, Oriented *3, No new F.N deficits, Normal affect [place].AT,PERRAL Supple Neck,No JVD, No cervical lymphadenopathy appriciated.  Symmetrical Chest wall movement, Good air movement bilaterally, CTAB RRR,No Gallops,Rubs or new Murmurs, No Parasternal Heave +ve B.Sounds, Abd Soft, Non tender, No organomegaly appriciated, No rebound -guarding or rigidity. No Cyanosis, Clubbing or edema, No new Rash or bruise  DISCHARGE CONDITION: Stable  DISPOSITION: Home  DISCHARGE INSTRUCTIONS:    Activity:  As tolerated   Diet recommendation: Heart Healthy diet- but low-fat-and soft diet for the next 1-2 weeks       Discharge Instructions   Call MD for:  persistant nausea and vomiting    Complete by:  As directed      Call MD for:  severe uncontrolled pain    Complete by:  As directed      Diet - low sodium heart healthy    Complete by:  As directed   Low-fat diet-soft in consistency for the next 1-2 weeks     Increase activity slowly    Complete by:  As directed            Follow-up Information   Follow up with Annye Asa, MD. Schedule an appointment as soon as possible for a visit in 1 week.   Specialty:  Family Medicine   Contact information:   [place] [address] [phone]       Total Time spent on discharge equals 45  minutes.  SignedOren Binet 07/27/2014 10:10 AM  **Disclaimer: This note may have been dictated with voice recognition software. Similar sounding words can inadvertently be transcribed and this note may contain transcription errors which may not have been corrected upon publication of note.**

## 2014-07-27 NOTE — Progress Notes (Signed)
PATIENT DETAILS Name: Christopher [name] Age: 38 y.o. Sex: male Date of Birth: [birth year] Admit Date: 07/22/2014 Admitting Physician Geradine Girt, DO XQJ:JHERDEYCX [name], MD  Subjective: No abdominal pain. Low back pain significantly improved. Tolerated low-fat diet this morning. Anxious to go home.  Assessment/Plan: Active Problems: Acute pancreatitis - Likely alcoholic- given significant alcohol intake this past Sunday. -CT of the abdomen shows pancreatic inflammation. No evidence of gallstones seen on CT of the abdomen or RUQ ultrasound. - Significantly improved, abdominal pain has resolved,  low back pain much better. Diet advanced, tolerated low-fat diet this morning. No requirement of narcotics for the past 24 hours.stable for discharge. Advised that patient needs to refrain from further alcohol use.  Fever -suspect secondary to pancreatic inflammation. Blood culture on 8/27 negative,CXR 8/27 neg for PNA, UA neg for UTI.No other foci of infection apparent -follow fever trend/curve- afebrile for the past 24 hours    TRANSAMINASES, SERUM, ELEVATED - Likely secondary to alcohol intake. No evidence of gallstones seen on CT of the abdomen on RUQ ultrasound. -resolved    AKI (acute kidney injury) - Likely prerenal - Resolved with IV fluids.  History of irritable bowel syndrome - Stable. - Continue Librax  History of alcohol use - No signs of withdrawal. Continue to monitor. On Ativan when necessary.  History of hypertension - BP creeping up, will restart Benicar on discharge  History of dyslipidemia - Triglycerides still elevated, but not in the range to cause pancreatitis. Resume [facility] on discharge  Disposition: Home today  DVT Prophylaxis: Prophylactic Heparin  Code Status: Full code   Family Communication None at bedside  Procedures:  None  CONSULTS:  None  MEDICATIONS: Scheduled Meds: . clidinium-chlordiazePOXIDE  1 capsule Oral QAC  breakfast  . folic acid  1 mg Oral Daily  . heparin  5,000 Units Subcutaneous 3 times per day  . multivitamin with minerals  1 tablet Oral Daily  . pantoprazole  40 mg Oral Daily  . polyethylene glycol  17 g Oral Daily  . senna  1 tablet Oral QHS  . thiamine  100 mg Oral Daily   Continuous Infusions: . sodium chloride 50 mL/hr (07/26/14 2147)   PRN Meds:.acetaminophen, hydrALAZINE, HYDROmorphone (DILAUDID) injection, ibuprofen, ondansetron (ZOFRAN) IV, oxyCODONE, temazepam  Antibiotics: Anti-infectives   None       PHYSICAL EXAM: Vital signs in last 24 hours: Filed Vitals:   07/26/14 0530 07/26/14 1350 07/26/14 2221 07/27/14 0540  BP: 123/80 153/85 150/100 125/75  Pulse: 86 87 62 62  Temp: 98.8 F (37.1 C) 98.5 F (36.9 C) 98.8 F (37.1 C) 97.8 F (36.6 C)  TempSrc: Oral Oral Oral Oral  Resp: 18 16 18 16   Height:      Weight:      SpO2: 96% 100% 100% 99%    Weight change:  Filed Weights   07/22/14 1511  Weight: 102.15 kg (225 lb 3.2 oz)   Body mass index is 30.54 kg/(m^2).   Gen Exam: Awake and alert with clear speech.   Neck: Supple, No JVD.   Chest: B/L Clear. No rales or rhonchi CVS: S1 S2 Regular, no murmurs.  Abdomen: soft, BS +, no epigastric tenderness, non distended.  Extremities: no edema, lower extremities warm to touch. Neurologic: Non Focal.   Skin: No Rash.   Wounds: N/A.   Intake/Output from previous day:  Intake/Output Summary (Last 24 hours) at 07/27/14 1004 Last data filed at 07/27/14 0200  Gross per 24 hour  Intake   1115 ml  Output      0 ml  Net   1115 ml     LAB RESULTS: CBC  Recent Labs Lab 07/22/14 0905 07/22/14 0926 07/23/14 0635 07/23/14 2357 07/24/14 0830  WBC 4.8  --  10.5 9.9 10.0  HGB 12.7* 13.6 11.8* 10.6* 10.5*  HCT 38.2* 40.0 35.6* 31.8* 31.5*  PLT 322  --  296 259 276  MCV 88.6  --  89.0 88.6 89.2  MCH 29.5  --  29.5 29.5 29.7  MCHC 33.2  --  33.1 33.3 33.3  RDW 12.5  --  12.6 12.6 12.6  LYMPHSABS   --   --   --  1.2  --   MONOABS  --   --   --  0.7  --   EOSABS  --   --   --  0.1  --   BASOSABS  --   --   --  0.0  --     Chemistries   Recent Labs Lab 07/22/14 0905 07/22/14 0926 07/23/14 0635 07/23/14 2357 07/24/14 0830 07/25/14 0752  NA 142 139 140 138 137 136*  K 4.1 3.8 4.4 3.3* 3.7 3.9  CL 104 107 101 103 102 99  CO2 24  --  25 22 22 24   GLUCOSE 110* 111* 109* 98 101* 106*  BUN 16 17 16 12 11 10   CREATININE 1.37* 1.40* 1.06 0.97 0.95 0.96  CALCIUM 9.2  --  8.0* 7.8* 8.0* 8.4    CBG: No results found for this basename: GLUCAP,  in the last 168 hours  GFR Estimated Creatinine Clearance: 129 ml/min (by C-G formula based on Cr of 0.96).  Coagulation profile No results found for this basename: INR, PROTIME,  in the last 168 hours  Cardiac Enzymes No results found for this basename: CK, CKMB, TROPONINI, MYOGLOBIN,  in the last 168 hours  No components found with this basename: POCBNP,  No results found for this basename: DDIMER,  in the last 72 hours No results found for this basename: HGBA1C,  in the last 72 hours No results found for this basename: CHOL, HDL, LDLCALC, TRIG, CHOLHDL, LDLDIRECT,  in the last 72 hours No results found for this basename: TSH, T4TOTAL, FREET3, T3FREE, THYROIDAB,  in the last 72 hours No results found for this basename: VITAMINB12, FOLATE, FERRITIN, TIBC, IRON, RETICCTPCT,  in the last 72 hours  Recent Labs  07/25/14 0752  LIPASE 106*    Urine Studies No results found for this basename: UACOL, UAPR, USPG, UPH, UTP, UGL, UKET, UBIL, UHGB, UNIT, UROB, ULEU, UEPI, UWBC, URBC, UBAC, CAST, CRYS, UCOM, BILUA,  in the last 72 hours  MICROBIOLOGY: Recent Results (from the past 240 hour(s))  CULTURE, BLOOD (ROUTINE X 2)     Status: None   Collection Time    07/23/14 12:05 AM      Result Value Ref Range Status   Specimen Description BLOOD RIGHT HAND   Final   Special Requests     Final   Value: BOTTLES DRAWN AEROBIC AND ANAEROBIC  8CC AER,5CC ANA   Culture  Setup Time     Final   Value: 07/24/2014 03:26     Performed at [facility]   Culture     Final   Value:        BLOOD CULTURE RECEIVED NO GROWTH TO DATE CULTURE WILL BE HELD FOR 5 DAYS BEFORE ISSUING A FINAL NEGATIVE  REPORT     Performed at [facility]   Report Status PENDING   Incomplete  CULTURE, BLOOD (ROUTINE X 2)     Status: None   Collection Time    07/23/14 11:57 PM      Result Value Ref Range Status   Specimen Description BLOOD RIGHT ARM   Final   Special Requests BOTTLES DRAWN AEROBIC AND ANAEROBIC 10CC   Final   Culture  Setup Time     Final   Value: 07/24/2014 03:26     Performed at [facility]   Culture     Final   Value:        BLOOD CULTURE RECEIVED NO GROWTH TO DATE CULTURE WILL BE HELD FOR 5 DAYS BEFORE ISSUING A FINAL NEGATIVE REPORT     Performed at [facility]   Report Status PENDING   Incomplete    RADIOLOGY STUDIES/RESULTS: Ct Angio Chest Pe W/cm &/or Wo Cm  07/22/2014   CLINICAL DATA:  Chest pain and epigastric pain. Question aortic dissection. Shortness of breath.  EXAM: CT ANGIOGRAPHY CHEST, ABDOMEN AND PELVIS  TECHNIQUE: Multidetector CT imaging through the chest, abdomen and pelvis was performed using the standard protocol during bolus administration of intravenous contrast. Multiplanar reconstructed images and MIPs were obtained and reviewed to evaluate the vascular anatomy.  CONTRAST:  178mL OMNIPAQUE IOHEXOL 350 MG/ML SOLN  COMPARISON:  None.  FINDINGS: CTA CHEST FINDINGS  No pathologically enlarged mediastinal, hilar or axillary lymph nodes. No evidence of aortic dissection. Heart is mildly enlarged. No pericardial effusion.  Minimal dependent atelectasis in the lungs. Added density throughout the lungs is indicative of expiratory phase imaging. No pleural fluid. Airway is unremarkable.  Review of the MIP images confirms the above findings.  CTA ABDOMEN AND PELVIS FINDINGS  Liver is decreased in  attenuation diffusely. Liver, gallbladder and adrenal glands are otherwise unremarkable. A 1.9 cm low-attenuation lesion in the interpolar right kidney measures 18 Hounsfield units. Kidneys and spleen are otherwise unremarkable.  There are inflammatory changes and fluid around the pancreas which appears slightly enlarged. Fluid extends along the right paracolic gutter. Pancreas is uniform in attenuation without evidence of necrosis.  Stomach is unremarkable. There may be mild duodenal wall thickening, secondarily. Small bowel and colon are otherwise unremarkable. Prostate and bladder are unremarkable. No pathologically enlarged lymph nodes. No pelvic free fluid.  Vague low attenuation is seen within the superior mesenteric vein with fairly uniform opacification of the splenic and portal veins. Vascular structures are otherwise grossly patent. No evidence of aortic dissection. No worrisome lytic or sclerotic lesions. Scattered bone islands.  Review of the MIP images confirms the above findings.  IMPRESSION: 1. No evidence of aortic dissection. 2. Acute pancreatitis. 3. Vague low attenuation in the superior mesenteric vein may be due to mixing artifact. Difficult to definitively exclude thrombus. 4. Marked hepatic steatosis. 5. Low-attenuation lesion in the right kidney is likely a cyst although difficult to definitively characterize without precontrast imaging. Further evaluation with nonemergent pre and post contrast MRI should be considered. Pre and post contrast CT could alternatively be performed, but would likely be of decreased accuracy given lesion size.   Electronically Signed   By: Lorin Picket M.D.   On: 07/22/2014 10:22   Dg Abd Acute W/chest  07/22/2014   CLINICAL DATA:  Sudden onset epigastric and mid abdominal pain.  EXAM: ACUTE ABDOMEN SERIES (ABDOMEN 2 VIEW & CHEST 1 VIEW)  COMPARISON:  None.  FINDINGS: Frontal view  of the chest shows midline trachea. Heart size normal. Lungs are somewhat low in  volume but clear. No pleural fluid.  Two views of the abdomen showed a fair amount of stool in the ascending colon. No small bowel dilatation. No unexpected radiopaque calculi.  IMPRESSION: 1. No acute findings in the chest. 2. Question mild constipation.   Electronically Signed   By: Lorin Picket M.D.   On: 07/22/2014 09:34   Ct Cta Abd/pel W/cm &/or W/o Cm  07/22/2014   CLINICAL DATA:  Chest pain and epigastric pain. Question aortic dissection. Shortness of breath.  EXAM: CT ANGIOGRAPHY CHEST, ABDOMEN AND PELVIS  TECHNIQUE: Multidetector CT imaging through the chest, abdomen and pelvis was performed using the standard protocol during bolus administration of intravenous contrast. Multiplanar reconstructed images and MIPs were obtained and reviewed to evaluate the vascular anatomy.  CONTRAST:  15mL OMNIPAQUE IOHEXOL 350 MG/ML SOLN  COMPARISON:  None.  FINDINGS: CTA CHEST FINDINGS  No pathologically enlarged mediastinal, hilar or axillary lymph nodes. No evidence of aortic dissection. Heart is mildly enlarged. No pericardial effusion.  Minimal dependent atelectasis in the lungs. Added density throughout the lungs is indicative of expiratory phase imaging. No pleural fluid. Airway is unremarkable.  Review of the MIP images confirms the above findings.  CTA ABDOMEN AND PELVIS FINDINGS  Liver is decreased in attenuation diffusely. Liver, gallbladder and adrenal glands are otherwise unremarkable. A 1.9 cm low-attenuation lesion in the interpolar right kidney measures 18 Hounsfield units. Kidneys and spleen are otherwise unremarkable.  There are inflammatory changes and fluid around the pancreas which appears slightly enlarged. Fluid extends along the right paracolic gutter. Pancreas is uniform in attenuation without evidence of necrosis.  Stomach is unremarkable. There may be mild duodenal wall thickening, secondarily. Small bowel and colon are otherwise unremarkable. Prostate and bladder are unremarkable. No  pathologically enlarged lymph nodes. No pelvic free fluid.  Vague low attenuation is seen within the superior mesenteric vein with fairly uniform opacification of the splenic and portal veins. Vascular structures are otherwise grossly patent. No evidence of aortic dissection. No worrisome lytic or sclerotic lesions. Scattered bone islands.  Review of the MIP images confirms the above findings.  IMPRESSION: 1. No evidence of aortic dissection. 2. Acute pancreatitis. 3. Vague low attenuation in the superior mesenteric vein may be due to mixing artifact. Difficult to definitively exclude thrombus. 4. Marked hepatic steatosis. 5. Low-attenuation lesion in the right kidney is likely a cyst although difficult to definitively characterize without precontrast imaging. Further evaluation with nonemergent pre and post contrast MRI should be considered. Pre and post contrast CT could alternatively be performed, but would likely be of decreased accuracy given lesion size.   Electronically Signed   By: Lorin Picket M.D.   On: 07/22/2014 10:22   US Abdomen Limited Ruq  07/22/2014   CLINICAL DATA:  Acute pancreatitis.  EXAM: US ABDOMEN LIMITED - RIGHT UPPER QUADRANT  COMPARISON:  CT abdomen pelvis 07/22/2014.  FINDINGS: Gallbladder:  No gallstones or wall thickening visualized. No sonographic Murphy sign noted.  Common bile duct:  Diameter: 4 mm, within normal limits.  Liver:  Diffusely increased in echogenicity.  IMPRESSION: Fatty liver.   Electronically Signed   By: Lorin Picket M.D.   On: 07/22/2014 12:01    Oren Binet, MD  [facility] Pager:[phone]  If 7PM-7AM, please contact night-coverage [email or web address] Password [email or web address] 07/27/2014, 10:04 AM   LOS: 5 days   **Disclaimer: This note may have been dictated with voice recognition  software. Similar sounding words can inadvertently be transcribed and this note may contain transcription errors which may not have been corrected upon publication of  note.**

## 2014-07-27 NOTE — Discharge Instructions (Signed)
Follow with Primary MD  Annye Asa, MD   instructed your Hospitalist MD  Please get a complete blood count and chemistry panel checked by your Primary MD at your next visit, and again as instructed by your Primary MD.  Stay on a soft diet for the next 1-2 weeks. Refrain from using alcohol in the future.  Get Medicines reviewed and adjusted. Please take all your medications with you for your next visit with your Primary MD  Please request your Primary MD to go over all hospital tests and procedure/radiological results at the follow up, please ask your Primary MD to get all Hospital records sent to his/her office.  If you experience worsening of your admission symptoms, develop shortness of breath, life threatening emergency, suicidal or homicidal thoughts you must seek medical attention immediately by calling 911 or calling your MD immediately  if symptoms less severe.  You must read complete instructions/literature along with all the possible adverse reactions/side effects for all the Medicines you take and that have been prescribed to you. Take any new Medicines after you have completely understood and accpet all the possible adverse reactions/side effects.   Do not drive when taking Pain medications.   Do not take more than prescribed Pain, Sleep and Anxiety Medications  Special Instructions: If you have smoked or chewed Tobacco  in the last 2 yrs please stop smoking, stop any regular Alcohol  and or any Recreational drug use.  Wear Seat belts while driving.  Please note  You were cared for by a hospitalist during your hospital stay. Once you are discharged, your primary care physician will handle any further medical issues. Please note that NO REFILLS for any discharge medications will be authorized once you are discharged, as it is imperative that you return to your primary care physician (or establish a relationship with a primary care physician if you do not have one) for your  aftercare needs so that they can reassess your need for medications and monitor your lab values.

## 2014-07-27 NOTE — Telephone Encounter (Signed)
Admit Date: 07/22/2014  Discharge date: 07/27/2014  Reason for admission:  Acute pancreatitis  Transition Care Management Follow-up Telephone Call  How have you been since you were released from the hospital? Feels about the same as he has for the past 18-24 hours.  Continues to have mild discomfort in his abdomen.     Do you understand why you were in the hospital? yes   Do you understand the discharge instrcutions? yes  Items Reviewed:  Medications reviewed: yes, pt states that he has to go out and get tylenol and ibuprofen.  Does not have any at home.    Allergies reviewed: yes Dietary changes reviewed: yes, pt was encouraged to consume a heart healthy diet low in fat.  He was also encouraged to consume a soft diet for the next 1-2 weeks.  Pt stated understanding and agreed to comply.    Referrals reviewed: Discharge summary states "Refer to gastroenterology if needed--deferred to PCP"  He was also encouraged to abstain from alcohol.     Functional Questionnaire:   Activities of Daily Living (ADLs):   He states they are independent in the following: ambulation, bathing and hygiene, feeding, continence, grooming, toileting and dressing States they require assistance with the following: none   Any transportation issues/concerns?: no   Any patient concerns? no   Confirmed importance and date/time of follow-up visits scheduled: Yes   Hospital Follow up appointment scheduled for Friday, July 31, 2014 @ 11:30 with Dr. [name]   Confirmed with patient if condition begins to worsen call PCP or go to the ER.  Patient was given the Call-a-Nurse line [phone]: Yes

## 2014-07-30 LAB — CULTURE, BLOOD (ROUTINE X 2)
CULTURE: NO GROWTH
Culture: NO GROWTH

## 2014-07-31 ENCOUNTER — Encounter: Payer: Self-pay | Admitting: Family Medicine

## 2014-07-31 ENCOUNTER — Other Ambulatory Visit: Payer: Self-pay | Admitting: Family Medicine

## 2014-07-31 ENCOUNTER — Ambulatory Visit (INDEPENDENT_AMBULATORY_CARE_PROVIDER_SITE_OTHER): Payer: 59 | Admitting: Family Medicine

## 2014-07-31 VITALS — BP 130/80 | HR 64 | Temp 98.2°F | Resp 16 | Wt 216.4 lb

## 2014-07-31 DIAGNOSIS — D649 Anemia, unspecified: Secondary | ICD-10-CM

## 2014-07-31 DIAGNOSIS — R748 Abnormal levels of other serum enzymes: Secondary | ICD-10-CM

## 2014-07-31 DIAGNOSIS — R945 Abnormal results of liver function studies: Secondary | ICD-10-CM

## 2014-07-31 DIAGNOSIS — K7689 Other specified diseases of liver: Secondary | ICD-10-CM

## 2014-07-31 DIAGNOSIS — R7989 Other specified abnormal findings of blood chemistry: Secondary | ICD-10-CM

## 2014-07-31 DIAGNOSIS — N2889 Other specified disorders of kidney and ureter: Secondary | ICD-10-CM | POA: Insufficient documentation

## 2014-07-31 DIAGNOSIS — K852 Alcohol induced acute pancreatitis without necrosis or infection: Secondary | ICD-10-CM

## 2014-07-31 LAB — BASIC METABOLIC PANEL
BUN: 17 mg/dL (ref 6–23)
CHLORIDE: 105 meq/L (ref 96–112)
CO2: 28 mEq/L (ref 19–32)
Calcium: 9 mg/dL (ref 8.4–10.5)
Creatinine, Ser: 1 mg/dL (ref 0.4–1.5)
GFR: 85.51 mL/min (ref >60.00)
Glucose, Bld: 80 mg/dL (ref 70–99)
POTASSIUM: 4 meq/L (ref 3.5–5.1)
SODIUM: 139 meq/L (ref 135–145)

## 2014-07-31 LAB — CBC WITH DIFFERENTIAL/PLATELET
Basophils Absolute: 0.1 10*3/uL (ref 0.0–0.1)
Basophils Relative: 0.7 % (ref 0.0–3.0)
Eosinophils Absolute: 0.3 10*3/uL (ref 0.0–0.7)
Eosinophils Relative: 3.5 % (ref 0.0–5.0)
HCT: 31.8 % — ABNORMAL LOW (ref 39.0–52.0)
Hemoglobin: 10.7 g/dL — ABNORMAL LOW (ref 13.0–17.0)
Lymphocytes Relative: 24 % (ref 12.0–46.0)
Lymphs Abs: 1.9 10*3/uL (ref 0.7–4.0)
MCHC: 33.7 g/dL (ref 30.0–36.0)
MCV: 87.4 fl (ref 78.0–100.0)
MONO ABS: 0.4 10*3/uL (ref 0.1–1.0)
Monocytes Relative: 4.8 % (ref 3.0–12.0)
Neutro Abs: 5.4 10*3/uL (ref 1.4–7.7)
Neutrophils Relative %: 67 % (ref 43.0–77.0)
PLATELETS: 493 10*3/uL — AB (ref 150.0–400.0)
RBC: 3.63 Mil/uL — ABNORMAL LOW (ref 4.22–5.81)
RDW: 13 % (ref 11.5–15.5)
WBC: 8 10*3/uL (ref 4.0–10.5)

## 2014-07-31 LAB — LIPASE: LIPASE: 292 U/L — AB (ref 11.0–59.0)

## 2014-07-31 LAB — HEPATIC FUNCTION PANEL
ALK PHOS: 45 U/L (ref 39–117)
ALT: 55 U/L — AB (ref 0–53)
AST: 34 U/L (ref 0–37)
Albumin: 3.6 g/dL (ref 3.5–5.2)
BILIRUBIN TOTAL: 0.4 mg/dL (ref 0.2–1.2)
Bilirubin, Direct: 0.1 mg/dL (ref 0.0–0.3)
Total Protein: 7.5 g/dL (ref 6.0–8.3)

## 2014-07-31 NOTE — Patient Instructions (Signed)
Schedule your complete physical at your convenience We'll notify you of your lab results and make any changes if needed Continue to refrain from alcohol We'll call you with your GI appt and your MRI to look at the kidney Call with any questions or concerns Hang in there! Happy Labor Day!

## 2014-07-31 NOTE — Assessment & Plan Note (Signed)
New to provider.  On admission, pt's LFTs were over 1300.  Had decreased to just over 100 prior to d/c.  Will repeat labs today to ensure they have normalized.  Again encouraged pt to abstain from drinking any ETOH.  Will follow.

## 2014-07-31 NOTE — Assessment & Plan Note (Signed)
New.  Noted on CT done during hospitalization.  Will get MRI w/ contrast as recommended in radiology report.  Pt was not aware of finding but is agreeable w/ plan.  Will follow.

## 2014-07-31 NOTE — Assessment & Plan Note (Signed)
New to provider, ongoing for pt.  Has never had GI work up.  Seems very nonchalant about his dx and his ongoing drinking.  Strongly encouraged him to abstain from ETOH.  Will refer to GI for evaluation.  Will follow.

## 2014-07-31 NOTE — Progress Notes (Signed)
   Subjective:    Patient ID: Christopher [name], male    DOB: 11/05/1976, 38 y.o.   MRN: [id number]  [facility] f/u- pt was admitted 8/26-8/31 for pancreatitis after heavy drinking at the .  Lipase was at 1358 on admission.  Pt reports drinking 'a case of beer' weekly.  Pt has fatty liver on CT- 'i've had a fatty liver for a long time'.  Has not had GI evaluation.  Pt was also found to have R renal cyst on CT- radiology recommended MRI w/ contrast.  Pt reports feeling '90%'.  Still having mild epigastric soreness and LBP after hospitalization.  Pt is not currently drinking.  Will have 10-12 beers for football tailgates.   Review of Systems For ROS see HPI     Objective:   Physical Exam  Vitals reviewed. Constitutional: He is oriented to person, place, and time. He appears well-developed and well-nourished. No distress.  overweight  HENT:  Head: Normocephalic and atraumatic.  Cardiovascular: Normal rate, regular rhythm, normal heart sounds and intact distal pulses.   Pulmonary/Chest: Effort normal and breath sounds normal. No respiratory distress. He has no wheezes. He has no rales.  Abdominal: Soft. Bowel sounds are normal. He exhibits no distension. There is no tenderness. There is no rebound.  Musculoskeletal: He exhibits no edema.  Neurological: He is alert and oriented to person, place, and time.  Skin: Skin is warm and dry. No erythema.  Psychiatric: He has a normal mood and affect. His behavior is normal.          Assessment & Plan:

## 2014-07-31 NOTE — Progress Notes (Signed)
Pre visit review using our clinic review tool, if applicable. No additional management support is needed unless otherwise documented below in the visit note. 

## 2014-08-06 ENCOUNTER — Other Ambulatory Visit (INDEPENDENT_AMBULATORY_CARE_PROVIDER_SITE_OTHER): Payer: 59

## 2014-08-06 DIAGNOSIS — R7989 Other specified abnormal findings of blood chemistry: Secondary | ICD-10-CM

## 2014-08-06 DIAGNOSIS — R945 Abnormal results of liver function studies: Secondary | ICD-10-CM

## 2014-08-06 DIAGNOSIS — D649 Anemia, unspecified: Secondary | ICD-10-CM

## 2014-08-06 DIAGNOSIS — R748 Abnormal levels of other serum enzymes: Secondary | ICD-10-CM

## 2014-08-07 LAB — CBC WITH DIFFERENTIAL/PLATELET
BASOS ABS: 0.1 10*3/uL (ref 0.0–0.1)
Basophils Relative: 1.6 % (ref 0.0–3.0)
EOS PCT: 2.9 % (ref 0.0–5.0)
Eosinophils Absolute: 0.2 10*3/uL (ref 0.0–0.7)
HEMATOCRIT: 34.5 % — AB (ref 39.0–52.0)
HEMOGLOBIN: 11.3 g/dL — AB (ref 13.0–17.0)
LYMPHS PCT: 31.6 % (ref 12.0–46.0)
Lymphs Abs: 1.8 10*3/uL (ref 0.7–4.0)
MCHC: 32.7 g/dL (ref 30.0–36.0)
MCV: 88.2 fl (ref 78.0–100.0)
Monocytes Absolute: 0.4 10*3/uL (ref 0.1–1.0)
Monocytes Relative: 6.3 % (ref 3.0–12.0)
NEUTROS ABS: 3.2 10*3/uL (ref 1.4–7.7)
Neutrophils Relative %: 57.6 % (ref 43.0–77.0)
Platelets: 597 10*3/uL — ABNORMAL HIGH (ref 150.0–400.0)
RBC: 3.91 Mil/uL — ABNORMAL LOW (ref 4.22–5.81)
RDW: 12.9 % (ref 11.5–15.5)
WBC: 5.6 10*3/uL (ref 4.0–10.5)

## 2014-08-07 LAB — HEPATIC FUNCTION PANEL
ALBUMIN: 4 g/dL (ref 3.5–5.2)
ALK PHOS: 37 U/L — AB (ref 39–117)
ALT: 50 U/L (ref 0–53)
AST: 31 U/L (ref 0–37)
Bilirubin, Direct: 0 mg/dL (ref 0.0–0.3)
TOTAL PROTEIN: 7.3 g/dL (ref 6.0–8.3)
Total Bilirubin: 0.3 mg/dL (ref 0.2–1.2)

## 2014-08-07 LAB — LIPASE: Lipase: 256 U/L — ABNORMAL HIGH (ref 11.0–59.0)

## 2014-08-08 ENCOUNTER — Ambulatory Visit (HOSPITAL_BASED_OUTPATIENT_CLINIC_OR_DEPARTMENT_OTHER)
Admission: RE | Admit: 2014-08-08 | Discharge: 2014-08-08 | Disposition: A | Discharge status: Home / Self Care | Payer: 59 | Source: Ambulatory Visit | Attending: Family Medicine | Admitting: Family Medicine

## 2014-08-08 DIAGNOSIS — K7689 Other specified diseases of liver: Secondary | ICD-10-CM | POA: Insufficient documentation

## 2014-08-08 DIAGNOSIS — K859 Acute pancreatitis without necrosis or infection, unspecified: Secondary | ICD-10-CM | POA: Diagnosis not present

## 2014-08-08 DIAGNOSIS — N289 Disorder of kidney and ureter, unspecified: Secondary | ICD-10-CM | POA: Insufficient documentation

## 2014-08-08 DIAGNOSIS — N2889 Other specified disorders of kidney and ureter: Secondary | ICD-10-CM

## 2014-08-08 MED ORDER — GADOBENATE DIMEGLUMINE 529 MG/ML IV SOLN
20.0000 mL | Freq: Once | INTRAVENOUS | Status: AC | PRN
  Administered 2014-08-08: 20 mL via INTRAVENOUS

## 2014-08-12 ENCOUNTER — Encounter: Payer: Self-pay | Admitting: Family Medicine

## 2014-08-13 ENCOUNTER — Encounter: Payer: Self-pay | Admitting: Internal Medicine

## 2014-08-16 ENCOUNTER — Encounter: Payer: Self-pay | Admitting: Family Medicine

## 2014-08-18 ENCOUNTER — Encounter: Payer: Self-pay | Admitting: Family Medicine

## 2014-08-18 MED ORDER — CILIDINIUM-CHLORDIAZEPOXIDE 2.5-5 MG PO CAPS: 1.0000 | ORAL_CAPSULE | Freq: Every day | ORAL | Status: DC

## 2014-08-18 MED ORDER — PANTOPRAZOLE SODIUM 40 MG PO TBEC: 40.0000 mg | DELAYED_RELEASE_TABLET | Freq: Every day | ORAL | Status: DC

## 2014-08-18 NOTE — Telephone Encounter (Signed)
Med filled.  

## 2014-08-25 ENCOUNTER — Other Ambulatory Visit: Payer: 59

## 2014-08-25 ENCOUNTER — Other Ambulatory Visit: Payer: Self-pay | Admitting: Family Medicine

## 2014-08-25 DIAGNOSIS — K859 Acute pancreatitis without necrosis or infection, unspecified: Secondary | ICD-10-CM

## 2014-08-25 DIAGNOSIS — D649 Anemia, unspecified: Secondary | ICD-10-CM

## 2014-08-25 DIAGNOSIS — N289 Disorder of kidney and ureter, unspecified: Secondary | ICD-10-CM

## 2014-08-25 LAB — HEMOCCULT SLIDES (X 3 CARDS)
Fecal Occult Blood: NEGATIVE
OCCULT 1: NEGATIVE
OCCULT 2: NEGATIVE
OCCULT 3: NEGATIVE
OCCULT 4: NEGATIVE
OCCULT 5: NEGATIVE

## 2014-08-26 ENCOUNTER — Ambulatory Visit: Payer: 59 | Admitting: Family Medicine

## 2014-10-07 ENCOUNTER — Encounter: Payer: Self-pay | Admitting: Family Medicine

## 2014-10-07 ENCOUNTER — Ambulatory Visit (INDEPENDENT_AMBULATORY_CARE_PROVIDER_SITE_OTHER): Payer: 59 | Admitting: Family Medicine

## 2014-10-07 VITALS — BP 130/90 | HR 76 | Temp 98.1°F | Resp 16 | Ht 73.0 in | Wt 216.1 lb

## 2014-10-07 DIAGNOSIS — Z0000 Encounter for general adult medical examination without abnormal findings: Secondary | ICD-10-CM

## 2014-10-07 LAB — HEPATIC FUNCTION PANEL
ALK PHOS: 31 U/L — AB (ref 39–117)
ALT: 77 U/L — ABNORMAL HIGH (ref 0–53)
AST: 51 U/L — AB (ref 0–37)
Albumin: 3.7 g/dL (ref 3.5–5.2)
Bilirubin, Direct: 0 mg/dL (ref 0.0–0.3)
Total Bilirubin: 0.5 mg/dL (ref 0.2–1.2)
Total Protein: 7.3 g/dL (ref 6.0–8.3)

## 2014-10-07 LAB — CBC WITH DIFFERENTIAL/PLATELET
BASOS ABS: 0 10*3/uL (ref 0.0–0.1)
Basophils Relative: 0.8 % (ref 0.0–3.0)
EOS ABS: 0.1 10*3/uL (ref 0.0–0.7)
Eosinophils Relative: 2 % (ref 0.0–5.0)
HEMATOCRIT: 36.5 % — AB (ref 39.0–52.0)
Hemoglobin: 12 g/dL — ABNORMAL LOW (ref 13.0–17.0)
LYMPHS ABS: 1.5 10*3/uL (ref 0.7–4.0)
Lymphocytes Relative: 35.8 % (ref 12.0–46.0)
MCHC: 32.9 g/dL (ref 30.0–36.0)
MCV: 83.7 fl (ref 78.0–100.0)
Monocytes Absolute: 0.4 10*3/uL (ref 0.1–1.0)
Monocytes Relative: 9.1 % (ref 3.0–12.0)
Neutro Abs: 2.2 10*3/uL (ref 1.4–7.7)
Neutrophils Relative %: 52.3 % (ref 43.0–77.0)
Platelets: 332 10*3/uL (ref 150.0–400.0)
RBC: 4.36 Mil/uL (ref 4.22–5.81)
RDW: 14.2 % (ref 11.5–15.5)
WBC: 4.2 10*3/uL (ref 4.0–10.5)

## 2014-10-07 LAB — LIPID PANEL
CHOL/HDL RATIO: 5
Cholesterol: 194 mg/dL (ref 0–200)
HDL: 35.6 mg/dL — ABNORMAL LOW (ref >39.00)
LDL Cholesterol: 129 mg/dL — ABNORMAL HIGH (ref 0–99)
NonHDL: 158.4
Triglycerides: 146 mg/dL (ref 0.0–149.0)
VLDL: 29.2 mg/dL (ref 0.0–40.0)

## 2014-10-07 LAB — BASIC METABOLIC PANEL
BUN: 15 mg/dL (ref 6–23)
CHLORIDE: 108 meq/L (ref 96–112)
CO2: 25 mEq/L (ref 19–32)
Calcium: 9.2 mg/dL (ref 8.4–10.5)
Creatinine, Ser: 1 mg/dL (ref 0.4–1.5)
GFR: 86.39 mL/min (ref >60.00)
Glucose, Bld: 95 mg/dL (ref 70–99)
POTASSIUM: 4.3 meq/L (ref 3.5–5.1)
SODIUM: 142 meq/L (ref 135–145)

## 2014-10-07 LAB — TSH: TSH: 1.84 u[IU]/mL (ref 0.35–4.50)

## 2014-10-07 NOTE — Patient Instructions (Signed)
Follow up in 6 months to recheck BP and cholesterol We'll notify you of your lab results and make any changes if needed Keep up the good work on healthy diet and regular exercise Call with any questions or concerns Happy Holidays!  Elbert Ewings in there!)

## 2014-10-07 NOTE — Assessment & Plan Note (Signed)
Pt's PE WNL.  Check labs.  UTD on flu shot.  Anticipatory guidance provided.

## 2014-10-07 NOTE — Progress Notes (Signed)
   Subjective:    Patient ID: Christopher [name], male    DOB: 1976-07-07, 38 y.o.   MRN: [id number]  HPI New to establish.  Previous MD- Arnoldo Morale  CPE- no physical concerns but is under a lot of stress.  6 yr old daughter is now pregnant and not attending school.  UTD on flu shot.   Review of Systems Patient reports no vision/hearing changes, anorexia, fever ,adenopathy, persistant/recurrent hoarseness, swallowing issues, chest pain, palpitations, edema, persistant/recurrent cough, hemoptysis, dyspnea (rest,exertional, paroxysmal nocturnal), gastrointestinal  bleeding (melena, rectal bleeding), abdominal pain, excessive heart burn, GU symptoms (dysuria, hematuria, voiding/incontinence issues) syncope, focal weakness, memory loss, numbness & tingling, skin/hair/nail changes, depression, anxiety, abnormal bruising/bleeding, musculoskeletal symptoms/signs.     Objective:   Physical Exam General Appearance:    Alert, cooperative, no distress, appears stated age  Head:    Normocephalic, without obvious abnormality, atraumatic  Eyes:    PERRL, conjunctiva/corneas clear, EOM's intact, fundi    benign, both eyes       Ears:    Normal TM's and external ear canals, both ears  Nose:   Nares normal, septum midline, mucosa normal, no drainage   or sinus tenderness  Throat:   Lips, mucosa, and tongue normal; teeth and gums normal  Neck:   Supple, symmetrical, trachea midline, no adenopathy;       thyroid:  No enlargement/tenderness/nodules  Back:     Symmetric, no curvature, ROM normal, no CVA tenderness  Lungs:     Clear to auscultation bilaterally, respirations unlabored  Chest wall:    No tenderness or deformity  Heart:    Regular rate and rhythm, S1 and S2 normal, no murmur, rub   or gallop  Abdomen:     Soft, non-tender, bowel sounds active all four quadrants,    no masses, no organomegaly  Genitalia:    Normal male without lesion, masses,discharge or tenderness  Rectal:    Deferred due to  young age  Extremities:   Extremities normal, atraumatic, no cyanosis or edema  Pulses:   2+ and symmetric all extremities  Skin:   Skin color, texture, turgor normal, no rashes or lesions  Lymph nodes:   Cervical, supraclavicular, and axillary nodes normal  Neurologic:   CNII-XII intact. Normal strength, sensation and reflexes      throughout          Assessment & Plan:

## 2014-10-07 NOTE — Progress Notes (Signed)
Pre visit review using our clinic review tool, if applicable. No additional management support is needed unless otherwise documented below in the visit note. 

## 2014-10-08 ENCOUNTER — Other Ambulatory Visit: Payer: Self-pay | Admitting: General Practice

## 2014-10-08 DIAGNOSIS — R945 Abnormal results of liver function studies: Secondary | ICD-10-CM

## 2014-10-08 DIAGNOSIS — R7989 Other specified abnormal findings of blood chemistry: Secondary | ICD-10-CM

## 2014-10-13 ENCOUNTER — Encounter: Payer: Self-pay | Admitting: Internal Medicine

## 2014-10-13 ENCOUNTER — Other Ambulatory Visit (INDEPENDENT_AMBULATORY_CARE_PROVIDER_SITE_OTHER): Payer: 59

## 2014-10-13 ENCOUNTER — Ambulatory Visit (INDEPENDENT_AMBULATORY_CARE_PROVIDER_SITE_OTHER): Payer: 59 | Admitting: Internal Medicine

## 2014-10-13 VITALS — BP 122/84 | HR 68 | Ht 72.0 in | Wt 216.2 lb

## 2014-10-13 DIAGNOSIS — R748 Abnormal levels of other serum enzymes: Secondary | ICD-10-CM

## 2014-10-13 DIAGNOSIS — K852 Alcohol induced acute pancreatitis without necrosis or infection: Secondary | ICD-10-CM

## 2014-10-13 DIAGNOSIS — K760 Fatty (change of) liver, not elsewhere classified: Secondary | ICD-10-CM

## 2014-10-13 DIAGNOSIS — R740 Nonspecific elevation of levels of transaminase and lactic acid dehydrogenase [LDH]: Secondary | ICD-10-CM

## 2014-10-13 LAB — IGA: IGA: 226 mg/dL (ref 68–378)

## 2014-10-13 LAB — FERRITIN: FERRITIN: 8.9 ng/mL — AB (ref 22.0–322.0)

## 2014-10-13 NOTE — Progress Notes (Signed)
Referred by Dr. [name] Subjective:    Patient ID: Christopher [name], male    DOB: 1976-02-27, 38 y.o.   MRN: [id number]  HPI The patient is a middle-aged white man with a history of regular alcohol use and was drinking heavily prior to an admission in late August for acute pancreatitis. Imaging did not show any signs of gallstones. He responded to supportive care and has done well since then without pain or signs or symptoms of pancreatitis.he has had abnormal transaminases as outlined in the EMR.he's had marked hepatic steatosis. This is seen on CT and an MRI that was done to confirm that he had a kidney cyst. The MRI was performed on September 12, approximately 2 and half to 3 weeks after his CT scan and showed resolving pancreatitis in the head no signs of a mass.  He reports that he drinks anywhere from 5-4 beers usually on weekends, in a week. He does not drink much liquor and does not admit to drinking wine. He has never had a DUI or other problems with alcohol but he's been a regular user of alcohol sometimes heavier on the weekends probably frequently heavier on the weekends since his early 41s.no known family history of liver disease. He does have a tattoo or at least 1. No known history of hepatitis this far as infectious.  Medications, allergies, past medical history, past surgical history, family history and social history are reviewed and updated in the EMR.  Review of Systems Positive for those minimization history of present illness. He has some back pain and fatigue.    Objective:   Physical Exam General:  Well-developed, well-nourished and in no acute distress Eyes:  anicteric. ENT:   Mouth and posterior pharynx free of lesions.  Neck:   supple w/o thyromegaly or mass.  Lungs: Clear to auscultation bilaterally. Heart:  S1S2, no rubs, murmurs, gallops. Abdomen:  soft, non-tender, no hepatosplenomegaly, hernia, or mass and BS+.  Lymph:  no cervical or supraclavicular  adenopathy. Extremities:   no edema Skin   no rash. No stigmata of chronic liver disease, there is a tattoo below the neck on the back in the midline Neuro:  A&O x 3.  Psych:  appropriate mood and  Affect.   Data Reviewed: CT scan, MRI, labs, hospital notes, primary care notes. These are all in the [facility] EMR. He is actually had a chronic episodic abnormal liver chemistry studies going back at least to 2011 where his ALT was 80 and his AST was 43. There is a history of hypertriglyceridemia as well. Highest level less he was 361 and 2011.he is treated with TriCor for many years with success.      Assessment & Plan:  Hepatic steatosis  Abnormal transaminases  Acute alcoholic pancreatitis  1. Lab w/u with Hepatitis B and C studies, ferritin, ANA and screening for celiac dz given abnormal transaminases and fatty liver 2. Stop alcohol which is most likely culprit of fatty liver and thought to have caused pancreatitis though there is evidence for nonalcoholic fatty liver disease with his hypertriglyceridemia as well. It may be a combination but alcohol certainly can be making things worse.I did not see a triglyceride level obtained at the time of his admission but with a history of heavy alcohol use and the fact that his triglycerides have been in control I think that makes hypertriglyceridemia an unlikely cause of pancreatitis. 3. Further plans pending lab results - await repeat LFT's ordered for next week also  I appreciate the opportunity to care for this patient.  CC: Annye Asa, MD

## 2014-10-13 NOTE — Patient Instructions (Addendum)
Your physician has requested that you go to the basement for lab work before leaving today.  We will be in touch with results and plans.  Stop alcohol as discussed.  Information on liver diseases given to you today to read.   I appreciate the opportunity to care for you.

## 2014-10-14 LAB — ANA: Anti Nuclear Antibody(ANA): NEGATIVE

## 2014-10-14 LAB — HEPATITIS C ANTIBODY: HCV Ab: NEGATIVE

## 2014-10-14 LAB — TISSUE TRANSGLUTAMINASE, IGA: Tissue Transglutaminase Ab, IgA: 6.9 U/mL (ref <20)

## 2014-10-14 LAB — HEPATITIS B SURFACE ANTIGEN: Hepatitis B Surface Ag: NEGATIVE

## 2014-10-14 NOTE — Progress Notes (Signed)
Quick Note:  Labs do not show infection with Hep B or C No autoimmune abnormality either and no celiac disease Iron level is low, however - so he has an iron deficiency anemia  Hemoccults were negative 1-2 months ago I recommend he have a colonoscopy anyway as could leak blood intermittently and want to make sure does not have polyps/early colon cancer  am ccing his PCP also ______

## 2014-10-20 ENCOUNTER — Ambulatory Visit (AMBULATORY_SURGERY_CENTER): Payer: Self-pay | Admitting: *Deleted

## 2014-10-20 VITALS — Ht 72.0 in | Wt 218.0 lb

## 2014-10-20 DIAGNOSIS — D509 Iron deficiency anemia, unspecified: Secondary | ICD-10-CM

## 2014-10-20 NOTE — Progress Notes (Signed)
No egg or soy allergy. ewm No diet pills. ewm No home 02 use. ewm No blood thinners. ewm No problems with past sedation. ewm Pt declined emmi video. ewm

## 2014-10-21 ENCOUNTER — Other Ambulatory Visit (INDEPENDENT_AMBULATORY_CARE_PROVIDER_SITE_OTHER): Payer: 59

## 2014-10-21 DIAGNOSIS — R945 Abnormal results of liver function studies: Secondary | ICD-10-CM

## 2014-10-21 DIAGNOSIS — R7989 Other specified abnormal findings of blood chemistry: Secondary | ICD-10-CM

## 2014-10-21 LAB — HEPATIC FUNCTION PANEL
ALT: 54 U/L — ABNORMAL HIGH (ref 0–53)
AST: 34 U/L (ref 0–37)
Albumin: 4.1 g/dL (ref 3.5–5.2)
Alkaline Phosphatase: 30 U/L — ABNORMAL LOW (ref 39–117)
Bilirubin, Direct: 0 mg/dL (ref 0.0–0.3)
Total Bilirubin: 0.3 mg/dL (ref 0.2–1.2)
Total Protein: 7.1 g/dL (ref 6.0–8.3)

## 2014-10-26 ENCOUNTER — Ambulatory Visit (AMBULATORY_SURGERY_CENTER): Payer: 59 | Admitting: Internal Medicine

## 2014-10-26 ENCOUNTER — Encounter: Payer: Self-pay | Admitting: Internal Medicine

## 2014-10-26 VITALS — BP 127/76 | HR 61 | Temp 96.8°F | Resp 18 | Ht 72.0 in | Wt 216.0 lb

## 2014-10-26 DIAGNOSIS — R748 Abnormal levels of other serum enzymes: Secondary | ICD-10-CM

## 2014-10-26 DIAGNOSIS — D509 Iron deficiency anemia, unspecified: Secondary | ICD-10-CM

## 2014-10-26 MED ORDER — FERROUS SULFATE 325 (65 FE) MG PO TABS: 325.0000 mg | ORAL_TABLET | Freq: Two times a day (BID) | ORAL | Status: DC

## 2014-10-26 MED ORDER — SODIUM CHLORIDE 0.9 % IV SOLN: 500.0000 mL | INTRAVENOUS | Status: DC

## 2014-10-26 NOTE — Patient Instructions (Addendum)
The colonoscopy was normal.  Please continue to avoid alcohol and arrange a follow-up with Dr. [name] to recheck liver chemistries and also follow-up on the low iron.  I want you to purchase ferrous sulfate (iron) tablets over the counter and take 2  Daily.  I appreciate the opportunity to care for you. Gatha Mayer, MD, FACG   YOU HAD AN ENDOSCOPIC PROCEDURE TODAY AT [facility]: Refer to the procedure report that was given to you for any specific questions about what was found during the examination.  If the procedure report does not answer your questions, please call your gastroenterologist to clarify.  If you requested that your care partner not be given the details of your procedure findings, then the procedure report has been included in a sealed envelope for you to review at your convenience later.  YOU SHOULD EXPECT: Some feelings of bloating in the abdomen. Passage of more gas than usual.  Walking can help get rid of the air that was put into your GI tract during the procedure and reduce the bloating. If you had a lower endoscopy (such as a colonoscopy or flexible sigmoidoscopy) you may notice spotting of blood in your stool or on the toilet paper. If you underwent a bowel prep for your procedure, then you may not have a normal bowel movement for a few days.  DIET: Your first meal following the procedure should be a light meal and then it is ok to progress to your normal diet.  A half-sandwich or bowl of soup is an example of a good first meal.  Heavy or fried foods are harder to digest and may make you feel nauseous or bloated.  Likewise meals heavy in dairy and vegetables can cause extra gas to form and this can also increase the bloating.  Drink plenty of fluids but you should avoid alcoholic beverages for 24 hours.  ACTIVITY: Your care partner should take you home directly after the procedure.  You should plan to take it easy, moving slowly for the rest of  the day.  You can resume normal activity the day after the procedure however you should NOT DRIVE or use heavy machinery for 24 hours (because of the sedation medicines used during the test).    SYMPTOMS TO REPORT IMMEDIATELY: A gastroenterologist can be reached at any hour.  During normal business hours, 8:30 AM to 5:00 PM Monday through Friday, call [phone].  After hours and on weekends, please call the GI answering service at [phone] who will take a message and have the physician on call contact you.   Following lower endoscopy (colonoscopy or flexible sigmoidoscopy):  Excessive amounts of blood in the stool  Significant tenderness or worsening of abdominal pains  Swelling of the abdomen that is new, acute  Fever of 100F or higher  FOLLOW UP: If any biopsies were taken you will be contacted by phone or by letter within the next 1-3 weeks.  Call your gastroenterologist if you have not heard about the biopsies in 3 weeks.  Our staff will call the home number listed on your records the next business day following your procedure to check on you and address any questions or concerns that you may have at that time regarding the information given to you following your procedure. This is a courtesy call and so if there is no answer at the home number and we have not heard from you through the emergency physician on call, we  will assume that you have returned to your regular daily activities without incident.  SIGNATURES/CONFIDENTIALITY: You and/or your care partner have signed paperwork which will be entered into your electronic medical record.  These signatures attest to the fact that that the information above on your After Visit Summary has been reviewed and is understood.  Full responsibility of the confidentiality of this discharge information lies with you and/or your care-partner.  Take your iron supplements and avoid alcohol.

## 2014-10-26 NOTE — Progress Notes (Signed)
Pt stable to RR 

## 2014-10-26 NOTE — Op Note (Signed)
[facility]  [address] [place], [address]   COLONOSCOPY PROCEDURE REPORT  PATIENT: Christopher, [name]  MR#: [id number] BIRTHDATE: January 29, 1976 , 38  yrs. old GENDER: male ENDOSCOPIST: Gatha Mayer, MD, [facility] PROCEDURE DATE:  10/26/2014 PROCEDURE:   Colonoscopy, diagnostic First Screening Colonoscopy - Avg.  risk and is 50 yrs.  old or older - No.  Prior Negative Screening - Now for repeat screening. N/A  History of Adenoma - Now for follow-up colonoscopy & has been > or = to 3 yrs.  N/A  Polyps Removed Today? No.  Polyps Removed Today? No.  Recommend repeat exam, <10 yrs? Polyps Removed Today? No.  Recommend repeat exam, <10 yrs? No. ASA CLASS:   Class II INDICATIONS:iron deficiency anemia. MEDICATIONS: Propofol 300 mg IV and Monitored anesthesia care  DESCRIPTION OF PROCEDURE:   After the risks benefits and alternatives of the procedure were thoroughly explained, informed consent was obtained.  The digital rectal exam revealed no abnormalities of the rectum, revealed the prostate was not enlarged, and revealed no prostatic nodules.   The LB [id number]  endoscope was introduced through the anus and advanced to the terminal ileum which was intubated for a short distance. No adverse events experienced.   The quality of the prep was excellent, using MiraLax  The instrument was then slowly withdrawn as the colon was fully examined.      COLON FINDINGS: A normal appearing cecum, ileocecal valve, and appendiceal orifice were identified.  The ascending, transverse, descending, sigmoid colon, and rectum appeared unremarkable.   The examined terminal ileum appeared to be normal.  Retroflexed views revealed no abnormalities. The time to cecum=1 minutes 56 seconds. Withdrawal time=7 minutes 26 seconds.  The scope was withdrawn and the procedure completed. COMPLICATIONS: There were no immediate complications.  ENDOSCOPIC IMPRESSION: Normal colonoscopy and  terminal ileum.  RECOMMENDATIONS: Repeat colonoscopy around age 73 (09/2025) Starte ferrous sulfate 325 mg bid F/U Dr. [name] in January - stay off EtOH and will have LFT's and iron rechecked by her  eSigned:  Gatha Mayer, MD, [facility] 10/26/2014 8:26 AM   cc: Dimple Nanas, MD and The Patient

## 2014-10-27 ENCOUNTER — Telehealth: Payer: Self-pay

## 2014-10-27 NOTE — Telephone Encounter (Signed)
No answer, left vm message. 

## 2014-11-30 ENCOUNTER — Encounter: Payer: Self-pay | Admitting: Family Medicine

## 2014-12-01 ENCOUNTER — Encounter: Payer: Self-pay | Admitting: Family Medicine

## 2014-12-01 ENCOUNTER — Ambulatory Visit (INDEPENDENT_AMBULATORY_CARE_PROVIDER_SITE_OTHER): Payer: 59 | Admitting: Family Medicine

## 2014-12-01 VITALS — BP 142/100 | HR 79 | Temp 97.7°F | Resp 16 | Wt 219.4 lb

## 2014-12-01 DIAGNOSIS — J209 Acute bronchitis, unspecified: Secondary | ICD-10-CM

## 2014-12-01 MED ORDER — BENZONATATE 200 MG PO CAPS: 200.0000 mg | ORAL_CAPSULE | Freq: Three times a day (TID) | ORAL | Status: DC | PRN

## 2014-12-01 MED ORDER — PROMETHAZINE-DM 6.25-15 MG/5ML PO SYRP: 5.0000 mL | ORAL_SOLUTION | Freq: Four times a day (QID) | ORAL | Status: DC | PRN

## 2014-12-01 MED ORDER — AZITHROMYCIN 250 MG PO TABS: ORAL_TABLET | ORAL | Status: DC

## 2014-12-01 NOTE — Assessment & Plan Note (Signed)
New.  Pt's sxs and PE not improving and are worsening.  Pt has multiple sick contacts- more than 1 w/ PNA.  Start abx.  Cough meds prn.  Reviewed supportive care and red flags that should prompt return.  Pt expressed understanding and is in agreement w/ plan.

## 2014-12-01 NOTE — Patient Instructions (Signed)
Follow up as needed Start the Zpack as directed Use the Tessalon cough pills in addition to Mucinex DM for daytime cough Use the syrup for nights/weekends (will cause drowsiness) Drink plenty of fluids REST! Add OTC Claritin or Zyrtec daily for nasal congestion and post nasal drip Call with any questions or concerns Hang in there! Happy New Year!!!

## 2014-12-01 NOTE — Progress Notes (Signed)
Pre visit review using our clinic review tool, if applicable. No additional management support is needed unless otherwise documented below in the visit note. 

## 2014-12-01 NOTE — Progress Notes (Signed)
   Subjective:    Patient ID: Christopher [name], male    DOB: 25-May-1976, 39 y.o.   MRN: [id number]  HPI URI- sxs started ~5 days ago.  + chest congestion, cough is intermittently productive.  No fevers.  + sore throat w/ laryngitis.  + PND.  Denies sinus pain/pressure.  No ear pain.  + sick contacts.  No relief w/ Mucinex DM.  No N/V/D.   Review of Systems For ROS see HPI     Objective:   Physical Exam  Constitutional: He appears well-developed and well-nourished. No distress.  HENT:  Head: Normocephalic and atraumatic.  Right Ear: Tympanic membrane normal.  Left Ear: Tympanic membrane normal.  Nose: No mucosal edema or rhinorrhea. Right sinus exhibits no maxillary sinus tenderness and no frontal sinus tenderness. Left sinus exhibits no maxillary sinus tenderness and no frontal sinus tenderness.  Mouth/Throat: Mucous membranes are normal. No oropharyngeal exudate, posterior oropharyngeal edema or posterior oropharyngeal erythema.  Eyes: Conjunctivae and EOM are normal. Pupils are equal, round, and reactive to light.  Neck: Normal range of motion. Neck supple.  Cardiovascular: Normal rate, regular rhythm and normal heart sounds.   Pulmonary/Chest: Effort normal and breath sounds normal. No respiratory distress. He has no wheezes.  + hacking cough  Lymphadenopathy:    He has no cervical adenopathy.  Skin: Skin is warm and dry.  Vitals reviewed.         Assessment & Plan:

## 2014-12-08 ENCOUNTER — Encounter: Payer: Self-pay | Admitting: Gastroenterology

## 2014-12-08 ENCOUNTER — Encounter: Payer: Self-pay | Admitting: Family Medicine

## 2014-12-08 MED ORDER — ONDANSETRON 4 MG PO TBDP: 4.0000 mg | ORAL_TABLET | Freq: Three times a day (TID) | ORAL | Status: DC | PRN

## 2015-01-28 ENCOUNTER — Encounter: Payer: Self-pay | Admitting: Family Medicine

## 2015-01-28 MED ORDER — PREDNISONE 10 MG PO TABS: ORAL_TABLET | ORAL | Status: DC

## 2015-02-05 ENCOUNTER — Encounter: Payer: Self-pay | Admitting: Family Medicine

## 2015-02-05 MED ORDER — TRIAMCINOLONE ACETONIDE 0.1 % EX OINT: 1.0000 "application " | TOPICAL_OINTMENT | Freq: Two times a day (BID) | CUTANEOUS | Status: DC

## 2015-02-08 ENCOUNTER — Ambulatory Visit (INDEPENDENT_AMBULATORY_CARE_PROVIDER_SITE_OTHER): Payer: 59 | Admitting: Family Medicine

## 2015-02-08 ENCOUNTER — Encounter: Payer: Self-pay | Admitting: Family Medicine

## 2015-02-08 VITALS — BP 120/80 | HR 75 | Temp 98.0°F | Resp 16 | Wt 217.0 lb

## 2015-02-08 DIAGNOSIS — S76111A Strain of right quadriceps muscle, fascia and tendon, initial encounter: Secondary | ICD-10-CM | POA: Insufficient documentation

## 2015-02-08 DIAGNOSIS — L237 Allergic contact dermatitis due to plants, except food: Secondary | ICD-10-CM | POA: Insufficient documentation

## 2015-02-08 MED ORDER — METHYLPREDNISOLONE ACETATE 80 MG/ML IJ SUSP
80.0000 mg | Freq: Once | INTRAMUSCULAR | Status: AC
  Administered 2015-02-08: 80 mg via INTRAMUSCULAR

## 2015-02-08 MED ORDER — PREDNISONE 10 MG PO TABS: ORAL_TABLET | ORAL | Status: DC

## 2015-02-08 NOTE — Patient Instructions (Signed)
Follow up as needed Start the Prednisone tomorrow morning w/ food Use the topical triamcinolone ointment twice daily Call with any questions or concerns Hang in there!!!

## 2015-02-08 NOTE — Assessment & Plan Note (Signed)
New.  No muscle defect or bruising to suggest quad tear.  Stressed heat, ice, and stretching.  Reviewed supportive care and red flags that should prompt return.  Pt expressed understanding and is in agreement w/ plan.

## 2015-02-08 NOTE — Assessment & Plan Note (Signed)
New.  Pt's vesicular rash in setting of recent yard work and hx of similar are highly suggestive of poison ivy.  Pred taper improved sxs but pt was then re-exposed.  Depo-medrol given in office today, pt to start pred taper tomorrow.  Reviewed supportive care and red flags that should prompt return.  Pt expressed understanding and is in agreement w/ plan.

## 2015-02-08 NOTE — Progress Notes (Signed)
   Subjective:    Patient ID: Christopher [name], male    DOB: 1976-11-20, 39 y.o.   MRN: [id number]  HPI Poison ivy- pt had sent a MyChart message indicating he had poison ivy.  Pt was given a prednisone taper along w/ Triamcinolone ointment.  Pt was cutting ivy away from trees the last 2 weekends.  Pt reports that areas improved after 1st round of prednisone but then sxs returned.  Hx of severe reactions.  sxs are predominately on arms bilaterally, waistband and L upper thigh.  R quad pull- pt was playing in adult kickball game and felt a pop.  No swelling, no bruising, no muscle defect.  Pain is radiating up into hip flexor.  No limp.   Review of Systems For ROS see HPI     Objective:   Physical Exam  Constitutional: He is oriented to person, place, and time. He appears well-developed and well-nourished. No distress.  HENT:  Head: Normocephalic and atraumatic.  Cardiovascular: Intact distal pulses.   Musculoskeletal: Normal range of motion. He exhibits tenderness (mild TTP over R upper quad- no muscle defect). He exhibits no edema.  Neurological: He is alert and oriented to person, place, and time. He has normal reflexes. No cranial nerve deficit. Coordination normal.  Skin: Skin is warm and dry. Rash (scattered vesicular rash on arms bilaterally, waistband and L inner thigh) noted.  Psychiatric: He has a normal mood and affect. His behavior is normal. Thought content normal.  Vitals reviewed.         Assessment & Plan:

## 2015-02-08 NOTE — Progress Notes (Signed)
Pre visit review using our clinic review tool, if applicable. No additional management support is needed unless otherwise documented below in the visit note. 

## 2015-02-11 NOTE — Telephone Encounter (Signed)
Opened in error

## 2015-02-17 ENCOUNTER — Other Ambulatory Visit: Payer: Self-pay | Admitting: Family Medicine

## 2015-02-17 ENCOUNTER — Encounter: Payer: Self-pay | Admitting: Family Medicine

## 2015-02-17 MED ORDER — PANTOPRAZOLE SODIUM 40 MG PO TBEC: 40.0000 mg | DELAYED_RELEASE_TABLET | Freq: Every day | ORAL | Status: DC

## 2015-02-17 MED ORDER — FENOFIBRATE 145 MG PO TABS: 145.0000 mg | ORAL_TABLET | Freq: Every day | ORAL | Status: DC

## 2015-02-17 MED ORDER — CILIDINIUM-CHLORDIAZEPOXIDE 2.5-5 MG PO CAPS: 1.0000 | ORAL_CAPSULE | Freq: Every day | ORAL | Status: DC

## 2015-02-17 MED ORDER — OLMESARTAN MEDOXOMIL 40 MG PO TABS: 40.0000 mg | ORAL_TABLET | Freq: Every day | ORAL | Status: DC

## 2015-02-17 NOTE — Telephone Encounter (Signed)
Med filled.  

## 2015-03-31 ENCOUNTER — Encounter: Payer: Self-pay | Admitting: Family Medicine

## 2015-04-01 ENCOUNTER — Ambulatory Visit (INDEPENDENT_AMBULATORY_CARE_PROVIDER_SITE_OTHER): Payer: 59 | Admitting: Medical

## 2015-04-01 ENCOUNTER — Encounter: Payer: Self-pay | Admitting: Medical

## 2015-04-01 VITALS — BP 148/85 | HR 71 | Temp 98.1°F | Ht 72.0 in | Wt 216.0 lb

## 2015-04-01 DIAGNOSIS — R109 Unspecified abdominal pain: Secondary | ICD-10-CM | POA: Insufficient documentation

## 2015-04-01 DIAGNOSIS — E785 Hyperlipidemia, unspecified: Secondary | ICD-10-CM

## 2015-04-01 DIAGNOSIS — R1010 Upper abdominal pain, unspecified: Secondary | ICD-10-CM

## 2015-04-01 LAB — POCT URINALYSIS DIPSTICK
Bilirubin, UA: NEGATIVE
Blood, UA: NEGATIVE
Glucose, UA: NEGATIVE
KETONES UA: NEGATIVE
Leukocytes, UA: NEGATIVE
Nitrite, UA: NEGATIVE
Protein, UA: NEGATIVE
SPEC GRAV UA: 1.015
Urobilinogen, UA: 0.2
pH, UA: 6

## 2015-04-01 MED ORDER — RANITIDINE HCL 150 MG PO CAPS: 150.0000 mg | ORAL_CAPSULE | Freq: Two times a day (BID) | ORAL | Status: DC

## 2015-04-01 NOTE — Progress Notes (Signed)
Subjective:    Patient ID: Christopher [name], male    DOB: [birth year], 39 y.o.   MRN: [id number]  HPI  Pt in with some pain with some pain in his epigastric area and some on his side. Pt was in [place] for a week. He just got back on Sunday. Pt states pain abdomen on Sunday on plane felt mild pain. Then on Monday felt pain. Pain is level 4-5 comes and goes. Pain in his epigstric area after eating. Some pain in is back. Pt has hx of gerd, fatty liver and ibs. Pt still has gallbladder. Pt did drink a lot of alcohol for about 6 days straight. Someday drank constantly. Maybe 12 pack on average. Some liquor. Pancreatitis end of august past year. Pt is on iron. No change in his stools.  No nausea, no vomiting. No fever, no chills or sweats. No pain on urination. Pain mostly when eats. Some rt flank and lower back pain.    Review of Systems  Constitutional: Negative for fever, chills and fatigue.  Respiratory: Negative for cough, choking and wheezing.   Cardiovascular: Negative for chest pain and palpitations.  Gastrointestinal: Positive for abdominal pain. Negative for nausea, vomiting, diarrhea, constipation, blood in stool, abdominal distention, anal bleeding and rectal pain.  Genitourinary: Negative.   Musculoskeletal: Positive for back pain.  Skin: Negative for rash.  Neurological: Negative for dizziness, syncope, speech difficulty, weakness, light-headedness and numbness.  Hematological: Negative for adenopathy.  Psychiatric/Behavioral: Negative for confusion and sleep disturbance.   Past Medical History  Diagnosis Date  . GERD (gastroesophageal reflux disease)   . Hyperlipidemia   . IBS (irritable bowel syndrome)   . Hypertension   . Pain in lower limb 03/02/2014  . 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 H/O hiatal hernia   . Migraine     "probably monthly" (07/22/2014)  . Pancreatitis   . Hepatic steatosis   . Kidney cyst, acquired     History   Social History  . Marital Status: Married     Spouse Name: N/A  . Number of Children: 2  . Years of Education: N/A   Occupational History  . Not on file.   Social History Main Topics  . Smoking status: Never Smoker   . Smokeless tobacco: Never Used  . Alcohol Use: 14.4 oz/week    24 Cans of beer per week     Comment: 07/22/2014 "case of beer/week or less; I don't drink qd; drink mostly on the weekends"  . Drug Use: No  . Sexual Activity: Yes   Other Topics Concern  . Not on file   Social History Narrative   Married, second wife, daughter born in 78, son born in 2000   He is a Quarry manager for [employer]   3 caffeinated beverages daily    Past Surgical History  Procedure Laterality Date  . Wrist fracture surgery Right 2008    "got plate and screws"  . Carpal tunnel release Right 2008  . Refractive surgery Bilateral 2013  . Knee arthroscopy Right 1992  . Cyst excision Right 2015    "2nd toe"  . Upper gi endoscopy      chest pain  . Colonoscopy      IBS  . Upper gastrointestinal endoscopy      Family History  Problem Relation Age of Onset  . Hypertension Mother   . Hyperlipidemia Mother   . Hypertension Father   . Colon cancer Neg Hx  No Known Allergies  Current Outpatient Prescriptions on File Prior to Visit  Medication Sig Dispense Refill  . clidinium-chlordiazePOXIDE (LIBRAX) 5-2.5 MG per capsule Take 1 capsule by mouth daily. 90 capsule 1  . fenofibrate (TRICOR) 145 MG tablet Take 1 tablet (145 mg total) by mouth daily. 90 tablet 1  . ferrous sulfate 325 (65 FE) MG tablet Take 1 tablet (325 mg total) by mouth 2 (two) times daily with a meal.  3  . Multiple Vitamins-Minerals (MULTIVITAMIN PO) Take 1 tablet by mouth daily.    Marland Kitchen olmesartan (BENICAR) 40 MG tablet Take 1 tablet (40 mg total) by mouth daily. 90 tablet 1  . pantoprazole (PROTONIX) 40 MG tablet Take 1 tablet (40 mg total) by mouth daily. 90 tablet 1  . predniSONE (DELTASONE) 10 MG tablet 3 tabs x3 days and then 2  tabs x3 days and then 1 tab x3 days.  Take w/ food. (Patient not taking: Reported on 04/01/2015) 18 tablet 0  . temazepam (RESTORIL) 15 MG capsule Take 1 capsule (15 mg total) by mouth at bedtime as needed for sleep. (Patient not taking: Reported on 04/01/2015) 90 capsule 1  . triamcinolone ointment (KENALOG) 0.1 % Apply 1 application topically 2 (two) times daily. 90 g 1   No current facility-administered medications on file prior to visit.    BP 148/85 mmHg  Pulse 71  Temp(Src) 98.1 F (36.7 C) (Oral)  Ht 6' (1.829 m)  Wt 216 lb (97.977 kg)  BMI 29.29 kg/m2  SpO2 98%       Objective:   Physical Exam  General Appearance- Not in acute distress.  HEENT Eyes- Scleraeral/Conjuntiva-bilat- Not Yellow. Mouth & Throat- Normal.  Chest and Lung Exam Auscultation: Breath sounds:-Normal. Adventitious sounds:- No Adventitious sounds. cta  Cardiovascular Auscultation:Rythm - Regular. Heart Sounds -Normal heart sounds.  Abdomen Inspection:-Inspection Normal.  Palpation/Perucssion: Palpation and Percussion of the abdomen reveal- mild-moderate  epigastric  Tender, No Rebound tenderness, No rigidity(Guarding) and No Palpable abdominal masses.  Liver:-Normal.  Spleen:- Normal.   Rectal Anorectal Exam: Stool - Hemoccult of stool/mucous is Heme Negative. External - normal external exam. Internal - normal sphincter tone. No rectal mass.      Assessment & Plan:

## 2015-04-01 NOTE — Progress Notes (Signed)
Pre visit review using our clinic review tool, if applicable. No additional management support is needed unless otherwise documented below in the visit note. 

## 2015-04-01 NOTE — Assessment & Plan Note (Signed)
Get stat lab tomorrow am. Cbc, cmp, lipase, hpylori. And ggt.  Advised eat very minimal until midnight and fast from midnight.  If pain worsens or changes tonight then ED. Unable to get labs now since our lab closed. ED eval currently not indicated.  No alchohol. Continue protonix. Rx ranitidine.   Follow up next week with Dr. [name] or as needed. Depending on how he does and lab results he may need imaging studies.

## 2015-04-01 NOTE — Patient Instructions (Signed)
Pain of upper abdomen Get stat lab tomorrow am. Cbc, cmp, lipase, hpylori. And ggt.  Advised eat very minimal until midnight and fast from midnight.  If pain worsens or changes tonight then ED. Unable to get labs now since our lab closed. ED eval currently not indicated.  No alchohol. Continue protonix. Rx ranitidine.   Follow up next week with Christopher [name] or as needed. Depending on how he does and lab results he may need imaging studies.

## 2015-04-02 ENCOUNTER — Other Ambulatory Visit (INDEPENDENT_AMBULATORY_CARE_PROVIDER_SITE_OTHER): Payer: 59

## 2015-04-02 DIAGNOSIS — R1010 Upper abdominal pain, unspecified: Secondary | ICD-10-CM

## 2015-04-02 DIAGNOSIS — E785 Hyperlipidemia, unspecified: Secondary | ICD-10-CM

## 2015-04-02 LAB — CBC WITH DIFFERENTIAL/PLATELET
BASOS ABS: 0 10*3/uL (ref 0.0–0.1)
Basophils Relative: 0.6 % (ref 0.0–3.0)
EOS ABS: 0.2 10*3/uL (ref 0.0–0.7)
Eosinophils Relative: 4.3 % (ref 0.0–5.0)
HCT: 41.3 % (ref 39.0–52.0)
HEMOGLOBIN: 14.3 g/dL (ref 13.0–17.0)
LYMPHS ABS: 1.5 10*3/uL (ref 0.7–4.0)
Lymphocytes Relative: 29.5 % (ref 12.0–46.0)
MCHC: 34.7 g/dL (ref 30.0–36.0)
MCV: 85.6 fl (ref 78.0–100.0)
Monocytes Absolute: 0.5 10*3/uL (ref 0.1–1.0)
Monocytes Relative: 9.9 % (ref 3.0–12.0)
Neutro Abs: 2.8 10*3/uL (ref 1.4–7.7)
Neutrophils Relative %: 55.7 % (ref 43.0–77.0)
PLATELETS: 319 10*3/uL (ref 150.0–400.0)
RBC: 4.83 Mil/uL (ref 4.22–5.81)
RDW: 13.7 % (ref 11.5–15.5)
WBC: 5.1 10*3/uL (ref 4.0–10.5)

## 2015-04-02 LAB — H. PYLORI ANTIBODY, IGG: H PYLORI IGG: NEGATIVE

## 2015-04-02 LAB — COMPREHENSIVE METABOLIC PANEL
ALBUMIN: 4.1 g/dL (ref 3.5–5.2)
ALK PHOS: 43 U/L (ref 39–117)
ALT: 103 U/L — ABNORMAL HIGH (ref 0–53)
AST: 45 U/L — ABNORMAL HIGH (ref 0–37)
BILIRUBIN TOTAL: 0.8 mg/dL (ref 0.2–1.2)
BUN: 16 mg/dL (ref 6–23)
CO2: 28 mEq/L (ref 19–32)
CREATININE: 1.14 mg/dL (ref 0.40–1.50)
Calcium: 9.5 mg/dL (ref 8.4–10.5)
Chloride: 103 mEq/L (ref 96–112)
GFR: 75.79 mL/min (ref >60.00)
GLUCOSE: 92 mg/dL (ref 70–99)
Potassium: 4.4 mEq/L (ref 3.5–5.1)
Sodium: 140 mEq/L (ref 135–145)
Total Protein: 7.3 g/dL (ref 6.0–8.3)

## 2015-04-02 LAB — LIPASE: Lipase: 133 U/L — ABNORMAL HIGH (ref 11.0–59.0)

## 2015-04-02 LAB — LIPID PANEL
CHOL/HDL RATIO: 5
CHOLESTEROL: 210 mg/dL — AB (ref 0–200)
HDL: 41.8 mg/dL (ref >39.00)
LDL CALC: 134 mg/dL — AB (ref 0–99)
NonHDL: 168.2
TRIGLYCERIDES: 173 mg/dL — AB (ref 0.0–149.0)
VLDL: 34.6 mg/dL (ref 0.0–40.0)

## 2015-04-02 LAB — GAMMA GT: GGT: 47 U/L (ref 7–51)

## 2015-04-04 NOTE — Telephone Encounter (Signed)
I notified pt of his labs on Friday afternoon. Explained lipase elevated. Hydrate with water. No alcohol. Avoid any greasy or fatty foods. Very light/small meals. Rest pancrease. If pain worsens over weekend then ED evaluation. Offered to see on Monday and recommended repeat labs on Monday. Pt would rather wait until wed since seeing  Dr. [name] anyway on that day.

## 2015-04-07 ENCOUNTER — Encounter: Payer: Self-pay | Admitting: Family Medicine

## 2015-04-07 ENCOUNTER — Ambulatory Visit (INDEPENDENT_AMBULATORY_CARE_PROVIDER_SITE_OTHER): Payer: 59 | Admitting: Family Medicine

## 2015-04-07 VITALS — BP 138/86 | HR 76 | Temp 98.0°F | Resp 16 | Wt 217.1 lb

## 2015-04-07 DIAGNOSIS — K852 Alcohol induced acute pancreatitis without necrosis or infection: Secondary | ICD-10-CM

## 2015-04-07 DIAGNOSIS — I10 Essential (primary) hypertension: Secondary | ICD-10-CM | POA: Diagnosis not present

## 2015-04-07 DIAGNOSIS — E785 Hyperlipidemia, unspecified: Secondary | ICD-10-CM

## 2015-04-07 LAB — LIPASE: Lipase: 87 U/L — ABNORMAL HIGH (ref 11.0–59.0)

## 2015-04-07 LAB — HEPATIC FUNCTION PANEL
ALBUMIN: 4.2 g/dL (ref 3.5–5.2)
ALK PHOS: 39 U/L (ref 39–117)
ALT: 68 U/L — ABNORMAL HIGH (ref 0–53)
AST: 32 U/L (ref 0–37)
BILIRUBIN TOTAL: 0.7 mg/dL (ref 0.2–1.2)
Bilirubin, Direct: 0.2 mg/dL (ref 0.0–0.3)
Total Protein: 6.9 g/dL (ref 6.0–8.3)

## 2015-04-07 NOTE — Assessment & Plan Note (Signed)
Recurrent issue when pt drinks.  [place]  Lipase was again elevated w/ abdominal pain.  Pain has resolved, tolerating regular diet.  Repeat lipase to trend.  Will follow.  Advised abstaining from ETOH.

## 2015-04-07 NOTE — Assessment & Plan Note (Signed)
Chronic problem.  Adequate but not ideal control.  Asymptomatic.  Reviewed labs done last week.  No med changes at this time.  Will follow.

## 2015-04-07 NOTE — Progress Notes (Signed)
Pre visit review using our clinic review tool, if applicable. No additional management support is needed unless otherwise documented below in the visit note. 

## 2015-04-07 NOTE — Patient Instructions (Signed)
Schedule your complete physical in 6 months We'll notify you of your lab results and make any changes if needed Try and avoid alcohol and make healthier food choices- this will improve both liver function and cholesterol Keep up the good work on regular exercise Call with any questions or concerns Happy Spring!

## 2015-04-07 NOTE — Assessment & Plan Note (Signed)
Chronic problem.  Not on a statin due to elevated LFTs.  On fenofibrate daily for triglycerides.  Reviewed recent labs.  No med changes at time time.

## 2015-04-07 NOTE — Progress Notes (Signed)
   Subjective:    Patient ID: Christopher [name], male    DOB: February 09, 1976, 39 y.o.   MRN: [id number]  HPI HTN- chronic problem.  On Benicar 40mg  daily.  BP control is adequate but not ideal.  No CP, SOB, HAs, visual changes, edema.  Hyperlipidemia- chronic problem, on Fenofibrate.  Not currently on statin due to abnormal LFTs.  Pancreatitis- recurrent problem for pt w/ ETOH consumption.  Was on a cruise earlier this month and developed severe abdominal pain.  Lipase elevated at 133.  Abdominal pain has resolved.  Denies nausea.   Review of Systems For ROS see HPI     Objective:   Physical Exam  Constitutional: He is oriented to person, place, and time. He appears well-developed and well-nourished. No distress.  HENT:  Head: Normocephalic and atraumatic.  Eyes: Conjunctivae and EOM are normal. Pupils are equal, round, and reactive to light.  Neck: Normal range of motion. Neck supple. No thyromegaly present.  Cardiovascular: Normal rate, regular rhythm, normal heart sounds and intact distal pulses.   No murmur heard. Pulmonary/Chest: Effort normal and breath sounds normal. No respiratory distress.  Abdominal: Soft. Bowel sounds are normal. He exhibits no distension.  Musculoskeletal: He exhibits no edema.  Lymphadenopathy:    He has no cervical adenopathy.  Neurological: He is alert and oriented to person, place, and time. No cranial nerve deficit.  Skin: Skin is warm and dry.  Psychiatric: He has a normal mood and affect. His behavior is normal.  Vitals reviewed.         Assessment & Plan:

## 2015-06-09 ENCOUNTER — Encounter: Payer: Self-pay | Admitting: Family Medicine

## 2015-08-05 ENCOUNTER — Encounter: Payer: Self-pay | Admitting: Family Medicine

## 2015-08-06 ENCOUNTER — Encounter: Payer: Self-pay | Admitting: Family Medicine

## 2015-08-06 ENCOUNTER — Ambulatory Visit (INDEPENDENT_AMBULATORY_CARE_PROVIDER_SITE_OTHER): Payer: Commercial Managed Care - HMO | Admitting: Family Medicine

## 2015-08-06 VITALS — BP 120/80 | HR 79 | Temp 98.0°F | Resp 16 | Wt 219.5 lb

## 2015-08-06 DIAGNOSIS — J0100 Acute maxillary sinusitis, unspecified: Secondary | ICD-10-CM | POA: Diagnosis not present

## 2015-08-06 DIAGNOSIS — L989 Disorder of the skin and subcutaneous tissue, unspecified: Secondary | ICD-10-CM | POA: Diagnosis not present

## 2015-08-06 MED ORDER — MUPIROCIN CALCIUM 2 % EX CREA: 1.0000 "application " | TOPICAL_CREAM | Freq: Two times a day (BID) | CUTANEOUS | Status: DC

## 2015-08-06 MED ORDER — AZITHROMYCIN 250 MG PO TABS: ORAL_TABLET | ORAL | Status: DC

## 2015-08-06 NOTE — Assessment & Plan Note (Signed)
New.  Pt is a skin picker and has multiple lesions on arms bilaterally in various stages of healing.  Multiple scars.  Will start topical abx ointment to prevent secondary infxn and also to minimize scarring.  Will follow.

## 2015-08-06 NOTE — Patient Instructions (Signed)
Follow up as needed Start Claritin or Zyrtec daily while not feeling well If symptoms worsen over the weekend, get the Zpack and take as directed Drink plenty of fluids Compare the price of the prescription Mupirocin vs OTC Bacitracin- either will do the trick!  Twice daily STOP PICKING!!! Call with any questions or concerns Hang in there!!!

## 2015-08-06 NOTE — Progress Notes (Signed)
   Subjective:    Patient ID: Christopher [name], male    DOB: 1976-04-29, 39 y.o.   MRN: [id number]  HPI URI- woke Wednesday w/ sore throat, 'yesterday it hit me'.  Had shaking chills Wednesday night.  Pt reports nasal congestion, sinus pain/pressure- improved w/ Netti pot.  Mild cough- 'it's related to the drainage'.  No known sick contacts.  + HA  'i'm a bad picker'- picking at arms almost constantly.  Has multiple open sores on forearms.  No evidence of superimposed infection.   Review of Systems For ROS see HPI     Objective:   Physical Exam  Constitutional: He appears well-developed and well-nourished. No distress.  HENT:  Head: Normocephalic and atraumatic.  No TTP over sinuses + turbinate edema + PND TMs normal bilaterally  Eyes: Conjunctivae and EOM are normal. Pupils are equal, round, and reactive to light.  Neck: Normal range of motion. Neck supple.  Cardiovascular: Normal rate, regular rhythm and normal heart sounds.   Pulmonary/Chest: Effort normal and breath sounds normal. No respiratory distress. He has no wheezes.  Lymphadenopathy:    He has no cervical adenopathy.  Skin: Skin is warm and dry.  Multiple sores on forearms bilaterally  Psychiatric: He has a normal mood and affect. His behavior is normal.  Vitals reviewed.         Assessment & Plan:

## 2015-08-06 NOTE — Assessment & Plan Note (Signed)
New.  No evidence of bacterial infxn today on PE but given his description of sxs, concerned that he could be in the lull before the second sickening.  Will send Zpack to pharmacy for pt to have on hold over the weekend but he is aware to only pick up if sxs worsen.  Start OTC antihistamine.  Drink plenty of fluids.  Reviewed supportive care and red flags that should prompt return.  Pt expressed understanding and is in agreement w/ plan.

## 2015-08-06 NOTE — Progress Notes (Signed)
Pre visit review using our clinic review tool, if applicable. No additional management support is needed unless otherwise documented below in the visit note. 

## 2015-08-09 IMAGING — CR DG CHEST 1V PORT
1 series · 1 of 1 positions shown · non-contrast
Comparison: Prior CT from earlier the same day.

CLINICAL DATA: Fever

EXAM:
PORTABLE CHEST - 1 VIEW

[AP]
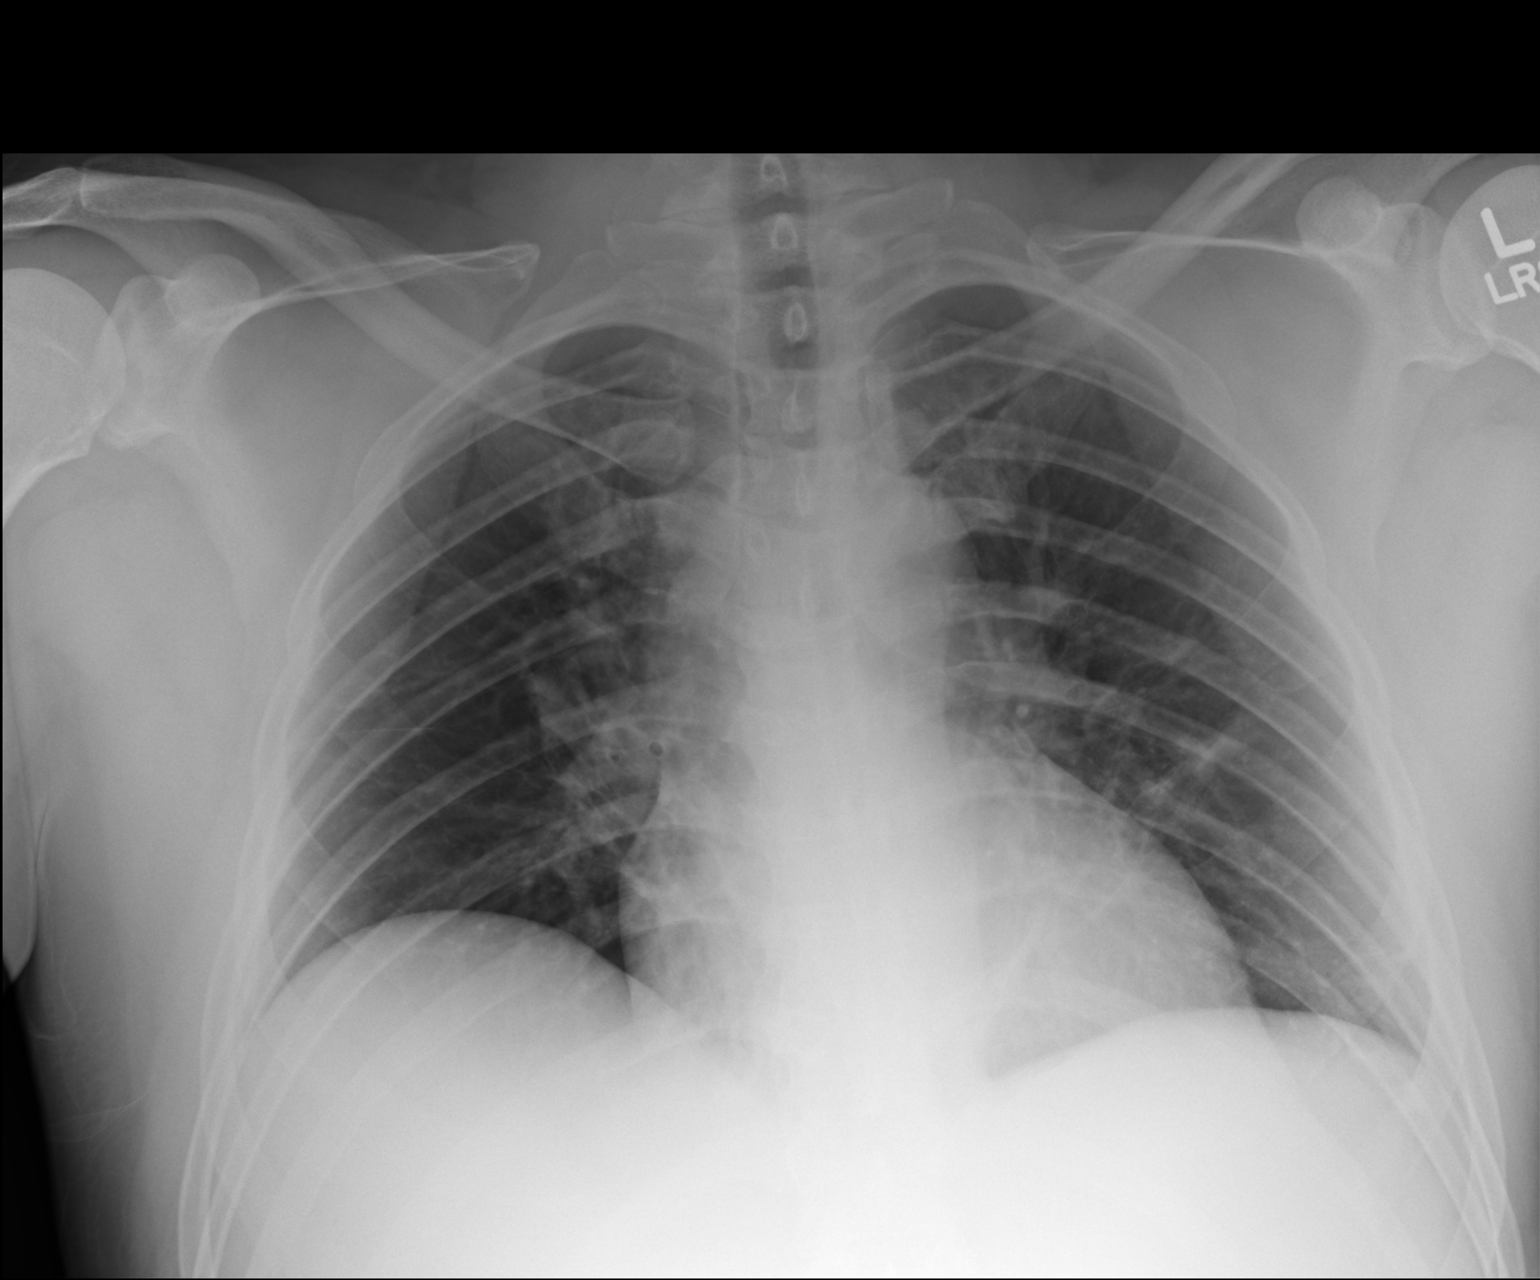

[1 of 1 positions shown; findings below may reference images not displayed]

FINDINGS: The cardiac and mediastinal silhouettes are stable in size and
contour, and remain within normal limits.

The lungs are normally inflated. Minimal left perihilar atelectasis
is noted. No airspace consolidation, pleural effusion, or pulmonary
edema is identified. There is no pneumothorax.

No acute osseous abnormality identified.
IMPRESSION: No active cardiopulmonary disease.

## 2015-08-17 ENCOUNTER — Encounter: Payer: Self-pay | Admitting: Family Medicine

## 2015-09-23 ENCOUNTER — Encounter: Payer: Self-pay | Admitting: Family Medicine

## 2015-09-29 ENCOUNTER — Telehealth: Payer: Self-pay | Admitting: Family Medicine

## 2015-09-29 NOTE — Telephone Encounter (Signed)
Medication filled to pharmacy as requested.   

## 2015-09-29 NOTE — Telephone Encounter (Signed)
Ok to fill, #90 and 1

## 2015-09-29 NOTE — Telephone Encounter (Signed)
[facility] last filled 08/18/14 #90 with 1

## 2015-10-08 ENCOUNTER — Encounter: Payer: Self-pay | Admitting: Behavioral Health

## 2015-10-08 ENCOUNTER — Telehealth: Payer: Self-pay | Admitting: Behavioral Health

## 2015-10-08 NOTE — Telephone Encounter (Signed)
Pre-Visit Call completed with patient and chart updated.   Pre-Visit Info documented in Specialty Comments under [employer].    

## 2015-10-11 ENCOUNTER — Ambulatory Visit (INDEPENDENT_AMBULATORY_CARE_PROVIDER_SITE_OTHER): Payer: Commercial Managed Care - HMO | Admitting: Family Medicine

## 2015-10-11 ENCOUNTER — Encounter: Payer: Self-pay | Admitting: Family Medicine

## 2015-10-11 ENCOUNTER — Other Ambulatory Visit: Payer: Self-pay | Admitting: Family Medicine

## 2015-10-11 VITALS — BP 122/84 | HR 67 | Temp 98.0°F | Resp 16 | Ht 72.0 in | Wt 219.2 lb

## 2015-10-11 DIAGNOSIS — Z0000 Encounter for general adult medical examination without abnormal findings: Secondary | ICD-10-CM

## 2015-10-11 DIAGNOSIS — Z23 Encounter for immunization: Secondary | ICD-10-CM

## 2015-10-11 DIAGNOSIS — R7989 Other specified abnormal findings of blood chemistry: Secondary | ICD-10-CM

## 2015-10-11 DIAGNOSIS — D3613 Benign neoplasm of peripheral nerves and autonomic nervous system of lower limb, including hip: Secondary | ICD-10-CM | POA: Diagnosis not present

## 2015-10-11 DIAGNOSIS — R945 Abnormal results of liver function studies: Principal | ICD-10-CM

## 2015-10-11 LAB — LIPID PANEL
Cholesterol: 197 mg/dL (ref 0–200)
HDL: 38.5 mg/dL — ABNORMAL LOW (ref >39.00)
NONHDL: 158.41
Total CHOL/HDL Ratio: 5
Triglycerides: 203 mg/dL — ABNORMAL HIGH (ref 0.0–149.0)
VLDL: 40.6 mg/dL — AB (ref 0.0–40.0)

## 2015-10-11 LAB — CBC WITH DIFFERENTIAL/PLATELET
BASOS ABS: 0 10*3/uL (ref 0.0–0.1)
BASOS PCT: 0.6 % (ref 0.0–3.0)
EOS PCT: 3.7 % (ref 0.0–5.0)
Eosinophils Absolute: 0.2 10*3/uL (ref 0.0–0.7)
HEMATOCRIT: 41.9 % (ref 39.0–52.0)
Hemoglobin: 14 g/dL (ref 13.0–17.0)
LYMPHS ABS: 1.3 10*3/uL (ref 0.7–4.0)
LYMPHS PCT: 26.9 % (ref 12.0–46.0)
MCHC: 33.3 g/dL (ref 30.0–36.0)
MCV: 87.8 fl (ref 78.0–100.0)
MONOS PCT: 9.3 % (ref 3.0–12.0)
Monocytes Absolute: 0.5 10*3/uL (ref 0.1–1.0)
NEUTROS ABS: 2.9 10*3/uL (ref 1.4–7.7)
NEUTROS PCT: 59.5 % (ref 43.0–77.0)
PLATELETS: 326 10*3/uL (ref 150.0–400.0)
RBC: 4.77 Mil/uL (ref 4.22–5.81)
RDW: 13.9 % (ref 11.5–15.5)
WBC: 4.9 10*3/uL (ref 4.0–10.5)

## 2015-10-11 LAB — BASIC METABOLIC PANEL
BUN: 17 mg/dL (ref 6–23)
CALCIUM: 9.6 mg/dL (ref 8.4–10.5)
CO2: 29 mEq/L (ref 19–32)
Chloride: 104 mEq/L (ref 96–112)
Creatinine, Ser: 1.15 mg/dL (ref 0.40–1.50)
GFR: 74.83 mL/min (ref >60.00)
GLUCOSE: 97 mg/dL (ref 70–99)
POTASSIUM: 4.2 meq/L (ref 3.5–5.1)
SODIUM: 142 meq/L (ref 135–145)

## 2015-10-11 LAB — HEPATIC FUNCTION PANEL
ALK PHOS: 35 U/L — AB (ref 39–117)
ALT: 84 U/L — ABNORMAL HIGH (ref 0–53)
AST: 41 U/L — ABNORMAL HIGH (ref 0–37)
Albumin: 4.3 g/dL (ref 3.5–5.2)
BILIRUBIN DIRECT: 0.2 mg/dL (ref 0.0–0.3)
BILIRUBIN TOTAL: 0.7 mg/dL (ref 0.2–1.2)
TOTAL PROTEIN: 7.3 g/dL (ref 6.0–8.3)

## 2015-10-11 LAB — TSH: TSH: 3.67 u[IU]/mL (ref 0.35–4.50)

## 2015-10-11 LAB — LDL CHOLESTEROL, DIRECT: Direct LDL: 131 mg/dL

## 2015-10-11 NOTE — Progress Notes (Signed)
   Subjective:    Patient ID: Christopher [name], male    DOB: November 27, 1976, 39 y.o.   MRN: [id number]  HPI CPE- L foot pain w/ nodule on bottom.  Appeared 7-10 days ago.  No known injury.  Shooting pain w/ pressure on bottom of foot.   Review of Systems Patient reports no vision/ hearing changes, adenopathy,fever, weight change,  persistant/recurrent hoarseness , swallowing issues, chest pain, palpitations, edema, persistant/recurrent cough, hemoptysis, dyspnea (rest/exertional/paroxysmal nocturnal), gastrointestinal bleeding (melena, rectal bleeding), abdominal pain, significant heartburn, bowel changes, GU symptoms (dysuria, hematuria, incontinence), Gyn symptoms (abnormal  bleeding, pain),  syncope, focal weakness, memory loss, numbness & tingling, skin/hair/nail changes, abnormal bruising or bleeding, anxiety, or depression.     Objective:   Physical Exam General Appearance:    Alert, cooperative, no distress, appears stated age  Head:    Normocephalic, without obvious abnormality, atraumatic  Eyes:    PERRL, conjunctiva/corneas clear, EOM's intact, fundi    benign, both eyes       Ears:    Normal TM's and external ear canals, both ears  Nose:   Nares normal, septum midline, mucosa normal, no drainage   or sinus tenderness  Throat:   Lips, mucosa, and tongue normal; teeth and gums normal  Neck:   Supple, symmetrical, trachea midline, no adenopathy;       thyroid:  No enlargement/tenderness/nodules  Back:     Symmetric, no curvature, ROM normal, no CVA tenderness  Lungs:     Clear to auscultation bilaterally, respirations unlabored  Chest wall:    No tenderness or deformity  Heart:    Regular rate and rhythm, S1 and S2 normal, no murmur, rub   or gallop  Abdomen:     Soft, non-tender, bowel sounds active all four quadrants,    no masses, no organomegaly  Genitalia:    Normal male without lesion, masses,discharge or tenderness  Rectal:    Deferred due to young age  Extremities:    Extremities normal, atraumatic, no cyanosis or edema  Pulses:   2+ and symmetric all extremities  Skin:   Skin color, texture, turgor normal, no rashes or lesions  Lymph nodes:   Cervical, supraclavicular, and axillary nodes normal  Neurologic:   CNII-XII intact. Normal strength, sensation and reflexes      throughout          Assessment & Plan:

## 2015-10-11 NOTE — Patient Instructions (Signed)
Follow up in 6 months to recheck BP and cholesterol We'll notify you of your lab results and make any changes if needed Continue to work on healthy diet and regular exercise Call with any questions or concerns If you want to join [place] at the new [place] office, any scheduled appointments will automatically transfer and we will see you at [address] [place] [address] [place], [place] [id number] ([place] 11/30/15) Happy Holidays!!!

## 2015-10-11 NOTE — Assessment & Plan Note (Signed)
Refer to Podiatry for complete evaluation and tx

## 2015-10-11 NOTE — Progress Notes (Signed)
Pre visit review using our clinic review tool, if applicable. No additional management support is needed unless otherwise documented below in the visit note. 

## 2015-10-11 NOTE — Assessment & Plan Note (Signed)
Pt's PE WNL.  Flu shot today.  Check labs.  Anticipatory guidance provided.

## 2015-10-15 ENCOUNTER — Ambulatory Visit (INDEPENDENT_AMBULATORY_CARE_PROVIDER_SITE_OTHER): Payer: Commercial Managed Care - HMO | Admitting: Podiatry

## 2015-10-15 ENCOUNTER — Ambulatory Visit (INDEPENDENT_AMBULATORY_CARE_PROVIDER_SITE_OTHER): Payer: Commercial Managed Care - HMO

## 2015-10-15 ENCOUNTER — Encounter: Payer: Self-pay | Admitting: Podiatry

## 2015-10-15 VITALS — BP 154/97 | HR 68 | Resp 16 | Ht 72.0 in | Wt 220.0 lb

## 2015-10-15 DIAGNOSIS — D3610 Benign neoplasm of peripheral nerves and autonomic nervous system, unspecified: Secondary | ICD-10-CM

## 2015-10-15 DIAGNOSIS — M7130 Other bursal cyst, unspecified site: Secondary | ICD-10-CM | POA: Diagnosis not present

## 2015-10-15 DIAGNOSIS — M799 Soft tissue disorder, unspecified: Secondary | ICD-10-CM | POA: Diagnosis not present

## 2015-10-15 DIAGNOSIS — M7989 Other specified soft tissue disorders: Secondary | ICD-10-CM

## 2015-10-15 NOTE — Progress Notes (Signed)
   Subjective:    Patient ID: Christopher [name], male    DOB: 12/06/1975, 39 y.o.   MRN: [id number]  HPI Patient presents with foot pain in their left foot; bump underneath skin below ball of foot; x2 weeks.   Review of Systems  All other systems reviewed and are negative.      Objective:   Physical Exam        Assessment & Plan:

## 2015-10-16 NOTE — Progress Notes (Signed)
Subjective:     Patient ID: Christopher [name], male   DOB: 10-21-1976, 39 y.o.   MRN: [id number]  HPI patient states I developed this bump underneath my left foot which is been quite tender when pressed and making it hard to walk on. It's been present for around 2-3 weeks and I do not remember specific injury   Review of Systems  All other systems reviewed and are negative.      Objective:   Physical Exam  Constitutional: He is oriented to person, place, and time.  Cardiovascular: Intact distal pulses.   Musculoskeletal: Normal range of motion.  Neurological: He is oriented to person, place, and time.  Skin: Skin is warm.  Nursing note and vitals reviewed.  neurovascular status intact muscle strength adequate with small nodule noted on the plantar aspect of the left arch medial side proximal to the metatarsal head. It is fibrous in its nature but there may be an inflammatory component to it as it's recent in its appearance and it is tender when pressed and measures approximate 5 x 5 mm     Assessment:     Mass which may be inflammatory and fluid buildup versus fibrous mass plantar aspect left arch    Plan:     H&P x-rays reviewed and condition reviewed with patient. We are in a try conservative care first and if it should remain remained painful or grow in size it will require excision. I did carefully injected under it with 3 mg dexamethasone Kenalog 5 mg Xylocaine to try to shrink it and I advised on heat therapy and monitoring the lesion and if it should remain change in size color or stay painful he will reappoint for probable excision

## 2015-10-28 ENCOUNTER — Other Ambulatory Visit (INDEPENDENT_AMBULATORY_CARE_PROVIDER_SITE_OTHER): Payer: Commercial Managed Care - HMO

## 2015-10-28 DIAGNOSIS — R7989 Other specified abnormal findings of blood chemistry: Secondary | ICD-10-CM | POA: Diagnosis not present

## 2015-10-28 DIAGNOSIS — R945 Abnormal results of liver function studies: Principal | ICD-10-CM

## 2015-10-28 LAB — HEPATIC FUNCTION PANEL
ALT: 59 U/L — ABNORMAL HIGH (ref 0–53)
AST: 34 U/L (ref 0–37)
Albumin: 4.2 g/dL (ref 3.5–5.2)
Alkaline Phosphatase: 34 U/L — ABNORMAL LOW (ref 39–117)
Bilirubin, Direct: 0.2 mg/dL (ref 0.0–0.3)
Total Bilirubin: 0.7 mg/dL (ref 0.2–1.2)
Total Protein: 7.3 g/dL (ref 6.0–8.3)

## 2015-12-16 ENCOUNTER — Telehealth: Payer: Self-pay | Admitting: Internal Medicine

## 2015-12-16 NOTE — Telephone Encounter (Signed)
Patient with intermittent abdominal pain.  Last episode was around Christmas.  He reports that some foods and alcohol flare up the pain.  He is advised to remain on his pantoprazole, bland diet and keep a diary of foods that cause pain, avoid alcohol. He is scheduled with Dr. Carlean Purl for 02/10/16 10:30

## 2015-12-17 ENCOUNTER — Ambulatory Visit: Payer: Commercial Managed Care - HMO | Admitting: Gastroenterology

## 2015-12-22 ENCOUNTER — Encounter: Payer: Self-pay | Admitting: Family Medicine

## 2015-12-24 ENCOUNTER — Ambulatory Visit (INDEPENDENT_AMBULATORY_CARE_PROVIDER_SITE_OTHER): Payer: Commercial Managed Care - HMO | Admitting: Family Medicine

## 2015-12-24 ENCOUNTER — Encounter: Payer: Self-pay | Admitting: Family Medicine

## 2015-12-24 VITALS — BP 134/90 | HR 68 | Temp 98.1°F | Ht 72.0 in | Wt 226.6 lb

## 2015-12-24 DIAGNOSIS — J301 Allergic rhinitis due to pollen: Secondary | ICD-10-CM

## 2015-12-24 MED ORDER — FLUTICASONE PROPIONATE 50 MCG/ACT NA SUSP: 2.0000 | Freq: Every day | NASAL | Status: DC

## 2015-12-24 MED ORDER — BENZONATATE 200 MG PO CAPS: 200.0000 mg | ORAL_CAPSULE | Freq: Three times a day (TID) | ORAL | Status: DC | PRN

## 2015-12-24 MED ORDER — PROMETHAZINE-DM 6.25-15 MG/5ML PO SYRP: 5.0000 mL | ORAL_SOLUTION | Freq: Four times a day (QID) | ORAL | Status: DC | PRN

## 2015-12-24 NOTE — Progress Notes (Signed)
Pre visit review using our clinic review tool, if applicable. No additional management support is needed unless otherwise documented below in the visit note. 

## 2015-12-24 NOTE — Patient Instructions (Signed)
Follow up as needed Continue daily Zyrtec Drink plenty of fluids Cough syrup for nights/weekends- will cause drowsiness Mucinex DM for daytime cough You can combine w/ Tessalon up to 3x/day Flonase- 2 sprays each nostril daily REST! Call with any questions or concerns Hang in there!!!

## 2015-12-24 NOTE — Progress Notes (Signed)
   Subjective:    Patient ID: Christopher [name], male    DOB: [birth year], 40 y.o.   MRN: [id number]  HPI URI- sxs started ~1 week ago.  Has sensation of chest congestion- now productive.  No fever.  'i feel fine'.  No SOB.  + tension HA.  No known sick contacts.  No N/V.  Not a smoker.  Recently started Zyrtec.  Not using nasal spray.   Review of Systems For ROS see HPI     Objective:   Physical Exam  Constitutional: He appears well-developed and well-nourished. No distress.  HENT:  Head: Normocephalic and atraumatic.  No TTP over sinuses + turbinate edema + PND TMs normal bilaterally  Eyes: Conjunctivae and EOM are normal. Pupils are equal, round, and reactive to light.  Neck: Normal range of motion. Neck supple.  Cardiovascular: Normal rate, regular rhythm and normal heart sounds.   Pulmonary/Chest: Effort normal and breath sounds normal. No respiratory distress. He has no wheezes.  Lymphadenopathy:    He has no cervical adenopathy.  Skin: Skin is warm and dry.  Vitals reviewed.         Assessment & Plan:

## 2015-12-24 NOTE — Assessment & Plan Note (Signed)
Pt's sxs consistent w/ allergic/viral etiology.  No evidence of bacterial infxn.  Lungs are CTA.  No need for abx.  Daily anti-histamine and nasal steroid.  Cough meds prn.  Reviewed supportive care and red flags that should prompt return.  Pt expressed understanding and is in agreement w/ plan.

## 2016-02-10 ENCOUNTER — Ambulatory Visit: Payer: Commercial Managed Care - HMO | Admitting: Internal Medicine

## 2016-02-14 ENCOUNTER — Encounter: Payer: Self-pay | Admitting: Family Medicine

## 2016-02-14 NOTE — Telephone Encounter (Signed)
Jess I would recommend he come in to be seen -- most likely [facility] in [place] where PT or sports medicine input would be beneficial but we would want to make sure it is not claudication from peripheral arterial disease.

## 2016-04-10 ENCOUNTER — Encounter: Payer: Self-pay | Admitting: Family Medicine

## 2016-04-10 ENCOUNTER — Ambulatory Visit (INDEPENDENT_AMBULATORY_CARE_PROVIDER_SITE_OTHER): Payer: Commercial Managed Care - HMO | Admitting: Family Medicine

## 2016-04-10 VITALS — BP 126/84 | HR 69 | Temp 98.0°F | Resp 16 | Ht 72.0 in | Wt 222.1 lb

## 2016-04-10 DIAGNOSIS — E785 Hyperlipidemia, unspecified: Secondary | ICD-10-CM

## 2016-04-10 DIAGNOSIS — I10 Essential (primary) hypertension: Secondary | ICD-10-CM | POA: Diagnosis not present

## 2016-04-10 LAB — BASIC METABOLIC PANEL
BUN: 16 mg/dL (ref 6–23)
CHLORIDE: 105 meq/L (ref 96–112)
CO2: 27 mEq/L (ref 19–32)
Calcium: 9.1 mg/dL (ref 8.4–10.5)
Creatinine, Ser: 1.18 mg/dL (ref 0.40–1.50)
GFR: 72.46 mL/min (ref >60.00)
GLUCOSE: 91 mg/dL (ref 70–99)
POTASSIUM: 3.9 meq/L (ref 3.5–5.1)
SODIUM: 140 meq/L (ref 135–145)

## 2016-04-10 LAB — HEPATIC FUNCTION PANEL
ALK PHOS: 39 U/L (ref 39–117)
ALT: 55 U/L — AB (ref 0–53)
AST: 31 U/L (ref 0–37)
Albumin: 4.3 g/dL (ref 3.5–5.2)
BILIRUBIN TOTAL: 0.5 mg/dL (ref 0.2–1.2)
Bilirubin, Direct: 0.1 mg/dL (ref 0.0–0.3)
Total Protein: 7.2 g/dL (ref 6.0–8.3)

## 2016-04-10 LAB — LIPID PANEL
CHOL/HDL RATIO: 5
CHOLESTEROL: 174 mg/dL (ref 0–200)
HDL: 36.4 mg/dL — ABNORMAL LOW (ref >39.00)
LDL CALC: 106 mg/dL — AB (ref 0–99)
NonHDL: 137.82
TRIGLYCERIDES: 157 mg/dL — AB (ref 0.0–149.0)
VLDL: 31.4 mg/dL (ref 0.0–40.0)

## 2016-04-10 LAB — CBC WITH DIFFERENTIAL/PLATELET
BASOS PCT: 0.7 % (ref 0.0–3.0)
Basophils Absolute: 0 10*3/uL (ref 0.0–0.1)
EOS ABS: 0.1 10*3/uL (ref 0.0–0.7)
EOS PCT: 3.3 % (ref 0.0–5.0)
HCT: 39.3 % (ref 39.0–52.0)
Hemoglobin: 13 g/dL (ref 13.0–17.0)
LYMPHS ABS: 1.1 10*3/uL (ref 0.7–4.0)
Lymphocytes Relative: 25.6 % (ref 12.0–46.0)
MCHC: 33.1 g/dL (ref 30.0–36.0)
MCV: 84.1 fl (ref 78.0–100.0)
MONO ABS: 0.4 10*3/uL (ref 0.1–1.0)
Monocytes Relative: 10 % (ref 3.0–12.0)
NEUTROS PCT: 60.4 % (ref 43.0–77.0)
Neutro Abs: 2.7 10*3/uL (ref 1.4–7.7)
PLATELETS: 333 10*3/uL (ref 150.0–400.0)
RBC: 4.68 Mil/uL (ref 4.22–5.81)
RDW: 14.8 % (ref 11.5–15.5)
WBC: 4.4 10*3/uL (ref 4.0–10.5)

## 2016-04-10 NOTE — Assessment & Plan Note (Signed)
Chronic problem.  Tolerating fenofibrate w/o difficulty.  Applauded his recent weight loss.  Check labs.  Adjust meds prn

## 2016-04-10 NOTE — Patient Instructions (Signed)
Schedule your complete physical in 6 months We'll notify you of your lab results and make any changes if needed Keep up the good work on healthy diet and regular exercise- you look great! Call with any questions or concerns Have a great summer!!!

## 2016-04-10 NOTE — Progress Notes (Signed)
   Subjective:    Patient ID: Christopher [name], male    DOB: 1975-12-28, 40 y.o.   MRN: [id number]  HPI HTN- chronic problem, on Benicar w/ good control.  Pt has lost 5 lbs.  Denies CP, SOB, HAs, visual changes.  Hyperlipidemia- chronic problem, on Fenofibrate daily.  Denies abd pain, N/V, myalgias.   Review of Systems For ROS see HPI     Objective:   Physical Exam  Constitutional: He is oriented to person, place, and time. He appears well-developed and well-nourished. No distress.  HENT:  Head: Normocephalic and atraumatic.  Eyes: Conjunctivae and EOM are normal. Pupils are equal, round, and reactive to light.  Neck: Normal range of motion. Neck supple. No thyromegaly present.  Cardiovascular: Normal rate, regular rhythm, normal heart sounds and intact distal pulses.   No murmur heard. Pulmonary/Chest: Effort normal and breath sounds normal. No respiratory distress.  Abdominal: Soft. Bowel sounds are normal. He exhibits no distension.  Musculoskeletal: He exhibits no edema.  Lymphadenopathy:    He has no cervical adenopathy.  Neurological: He is alert and oriented to person, place, and time. No cranial nerve deficit.  Skin: Skin is warm and dry.  Psychiatric: He has a normal mood and affect. His behavior is normal.  Vitals reviewed.         Assessment & Plan:

## 2016-04-10 NOTE — Assessment & Plan Note (Signed)
Chronic problem.  Well controlled.  Asymptomatic.  Check labs.  No anticipated med changes. 

## 2016-04-10 NOTE — Progress Notes (Signed)
Pre visit review using our clinic review tool, if applicable. No additional management support is needed unless otherwise documented below in the visit note. 

## 2016-04-18 ENCOUNTER — Emergency Department (HOSPITAL_COMMUNITY): Payer: Commercial Managed Care - HMO

## 2016-04-18 ENCOUNTER — Inpatient Hospital Stay (HOSPITAL_COMMUNITY)
Admission: EM | Admit: 2016-04-18 | Discharge: 2016-04-23 | DRG: 440 | Disposition: A | Discharge status: Home / Self Care | Payer: Commercial Managed Care - HMO | Attending: Family Medicine | Admitting: Family Medicine

## 2016-04-18 ENCOUNTER — Encounter (HOSPITAL_COMMUNITY): Payer: Self-pay

## 2016-04-18 DIAGNOSIS — K219 Gastro-esophageal reflux disease without esophagitis: Secondary | ICD-10-CM | POA: Diagnosis present

## 2016-04-18 DIAGNOSIS — E785 Hyperlipidemia, unspecified: Secondary | ICD-10-CM | POA: Diagnosis present

## 2016-04-18 DIAGNOSIS — I10 Essential (primary) hypertension: Secondary | ICD-10-CM | POA: Diagnosis present

## 2016-04-18 DIAGNOSIS — K852 Alcohol induced acute pancreatitis without necrosis or infection: Secondary | ICD-10-CM | POA: Diagnosis not present

## 2016-04-18 DIAGNOSIS — E781 Pure hyperglyceridemia: Secondary | ICD-10-CM | POA: Diagnosis present

## 2016-04-18 DIAGNOSIS — K859 Acute pancreatitis without necrosis or infection, unspecified: Secondary | ICD-10-CM | POA: Diagnosis present

## 2016-04-18 DIAGNOSIS — F1010 Alcohol abuse, uncomplicated: Secondary | ICD-10-CM | POA: Diagnosis present

## 2016-04-18 DIAGNOSIS — R1013 Epigastric pain: Secondary | ICD-10-CM | POA: Diagnosis present

## 2016-04-18 DIAGNOSIS — K8500 Idiopathic acute pancreatitis without necrosis or infection: Secondary | ICD-10-CM

## 2016-04-18 LAB — COMPREHENSIVE METABOLIC PANEL
ALBUMIN: 4.4 g/dL (ref 3.5–5.0)
ALK PHOS: 46 U/L (ref 38–126)
ALT: 61 U/L (ref 17–63)
AST: 44 U/L — AB (ref 15–41)
Anion gap: 10 (ref 5–15)
BILIRUBIN TOTAL: 0.5 mg/dL (ref 0.3–1.2)
BUN: 11 mg/dL (ref 6–20)
CALCIUM: 9.8 mg/dL (ref 8.9–10.3)
CO2: 24 mmol/L (ref 22–32)
Chloride: 105 mmol/L (ref 101–111)
Creatinine, Ser: 1.23 mg/dL (ref 0.61–1.24)
GFR calc Af Amer: >60 mL/min (ref >60)
GFR calc non Af Amer: >60 mL/min (ref >60)
GLUCOSE: 121 mg/dL — AB (ref 65–99)
Potassium: 3.5 mmol/L (ref 3.5–5.1)
Sodium: 139 mmol/L (ref 135–145)
TOTAL PROTEIN: 7.6 g/dL (ref 6.5–8.1)

## 2016-04-18 LAB — CBC
HCT: 41.4 % (ref 39.0–52.0)
HEMATOCRIT: 42.8 % (ref 39.0–52.0)
Hemoglobin: 13.8 g/dL (ref 13.0–17.0)
Hemoglobin: 13.4 g/dL (ref 13.0–17.0)
MCH: 27 pg (ref 26.0–34.0)
MCH: 27.3 pg (ref 26.0–34.0)
MCHC: 32.2 g/dL (ref 30.0–36.0)
MCHC: 32.4 g/dL (ref 30.0–36.0)
MCV: 83.8 fL (ref 78.0–100.0)
MCV: 84.5 fL (ref 78.0–100.0)
PLATELETS: 310 10*3/uL (ref 150–400)
Platelets: 383 10*3/uL (ref 150–400)
RBC: 5.11 MIL/uL (ref 4.22–5.81)
RBC: 4.9 MIL/uL (ref 4.22–5.81)
RDW: 13.6 % (ref 11.5–15.5)
RDW: 13.6 % (ref 11.5–15.5)
WBC: 15.4 10*3/uL — ABNORMAL HIGH (ref 4.0–10.5)
WBC: 12.1 10*3/uL — ABNORMAL HIGH (ref 4.0–10.5)

## 2016-04-18 LAB — CREATININE, SERUM
CREATININE: 1.27 mg/dL — AB (ref 0.61–1.24)
GFR calc Af Amer: >60 mL/min (ref >60)
GFR calc non Af Amer: >60 mL/min (ref >60)

## 2016-04-18 LAB — URINALYSIS, ROUTINE W REFLEX MICROSCOPIC
BILIRUBIN URINE: NEGATIVE
Glucose, UA: NEGATIVE mg/dL
Hgb urine dipstick: NEGATIVE
Ketones, ur: NEGATIVE mg/dL
Leukocytes, UA: NEGATIVE
NITRITE: NEGATIVE
PH: 7.5 (ref 5.0–8.0)
Protein, ur: NEGATIVE mg/dL
Specific Gravity, Urine: 1.016 (ref 1.005–1.030)

## 2016-04-18 LAB — LIPASE, BLOOD: Lipase: >3000 U/L — ABNORMAL HIGH (ref 11–51)

## 2016-04-18 LAB — GLUCOSE, CAPILLARY: Glucose-Capillary: 128 mg/dL — ABNORMAL HIGH (ref 65–99)

## 2016-04-18 LAB — TROPONIN I

## 2016-04-18 MED ORDER — SODIUM CHLORIDE 0.9 % IV SOLN: INTRAVENOUS | Status: DC

## 2016-04-18 MED ORDER — TEMAZEPAM 15 MG PO CAPS: 15.0000 mg | ORAL_CAPSULE | Freq: Every evening | ORAL | Status: DC | PRN

## 2016-04-18 MED ORDER — LORAZEPAM 2 MG/ML IJ SOLN: 1.0000 mg | Freq: Four times a day (QID) | INTRAMUSCULAR | Status: AC | PRN

## 2016-04-18 MED ORDER — VITAMIN B-1 100 MG PO TABS
100.0000 mg | ORAL_TABLET | Freq: Every day | ORAL | Status: DC
  Administered 2016-04-18 – 2016-04-23 (×4): 100 mg via ORAL
  Filled 2016-04-18 (×4): qty 1

## 2016-04-18 MED ORDER — ONDANSETRON HCL 4 MG/2ML IJ SOLN
4.0000 mg | Freq: Once | INTRAMUSCULAR | Status: DC
  Filled 2016-04-18: qty 2

## 2016-04-18 MED ORDER — MORPHINE SULFATE (PF) 2 MG/ML IV SOLN
INTRAVENOUS | Status: AC
  Filled 2016-04-18: qty 1

## 2016-04-18 MED ORDER — HYDROMORPHONE HCL 1 MG/ML IJ SOLN
1.0000 mg | INTRAMUSCULAR | Status: DC | PRN
  Administered 2016-04-18: 1 mg via INTRAVENOUS
  Filled 2016-04-18: qty 1

## 2016-04-18 MED ORDER — ACETAMINOPHEN 325 MG PO TABS
650.0000 mg | ORAL_TABLET | Freq: Four times a day (QID) | ORAL | Status: DC | PRN
  Administered 2016-04-21: 650 mg via ORAL
  Filled 2016-04-18: qty 2

## 2016-04-18 MED ORDER — FENTANYL CITRATE (PF) 100 MCG/2ML IJ SOLN
50.0000 ug | INTRAMUSCULAR | Status: DC | PRN
  Administered 2016-04-18: 50 ug via INTRAVENOUS
  Filled 2016-04-18: qty 2

## 2016-04-18 MED ORDER — LORAZEPAM 1 MG PO TABS: 1.0000 mg | ORAL_TABLET | Freq: Four times a day (QID) | ORAL | Status: AC | PRN

## 2016-04-18 MED ORDER — HYDRALAZINE HCL 20 MG/ML IJ SOLN: 10.0000 mg | INTRAMUSCULAR | Status: DC | PRN

## 2016-04-18 MED ORDER — FLUTICASONE PROPIONATE 50 MCG/ACT NA SUSP
2.0000 | Freq: Every day | NASAL | Status: DC
  Administered 2016-04-19 – 2016-04-23 (×4): 2 via NASAL
  Filled 2016-04-18 (×2): qty 16

## 2016-04-18 MED ORDER — LORAZEPAM 2 MG/ML IJ SOLN: 0.0000 mg | Freq: Two times a day (BID) | INTRAMUSCULAR | Status: AC

## 2016-04-18 MED ORDER — THIAMINE HCL 100 MG/ML IJ SOLN
100.0000 mg | Freq: Every day | INTRAMUSCULAR | Status: DC
  Administered 2016-04-20 – 2016-04-22 (×2): 100 mg via INTRAVENOUS
  Filled 2016-04-18 (×4): qty 2

## 2016-04-18 MED ORDER — SODIUM CHLORIDE 0.9 % IV SOLN
Freq: Once | INTRAVENOUS | Status: AC
  Administered 2016-04-18: 20:00:00 via INTRAVENOUS

## 2016-04-18 MED ORDER — ADULT MULTIVITAMIN W/MINERALS CH
1.0000 | ORAL_TABLET | Freq: Every day | ORAL | Status: DC
  Administered 2016-04-18 – 2016-04-23 (×6): 1 via ORAL
  Filled 2016-04-18 (×6): qty 1

## 2016-04-18 MED ORDER — ACETAMINOPHEN 650 MG RE SUPP: 650.0000 mg | Freq: Four times a day (QID) | RECTAL | Status: DC | PRN

## 2016-04-18 MED ORDER — HYDROMORPHONE HCL 1 MG/ML IJ SOLN
1.0000 mg | Freq: Once | INTRAMUSCULAR | Status: AC
  Administered 2016-04-18: 1 mg via INTRAVENOUS
  Filled 2016-04-18: qty 1

## 2016-04-18 MED ORDER — MORPHINE SULFATE (PF) 2 MG/ML IV SOLN
1.0000 mg | INTRAVENOUS | Status: DC | PRN
  Administered 2016-04-18: 1 mg via INTRAVENOUS

## 2016-04-18 MED ORDER — SODIUM CHLORIDE 0.9 % IV BOLUS (SEPSIS)
1000.0000 mL | Freq: Once | INTRAVENOUS | Status: AC
  Administered 2016-04-18: 1000 mL via INTRAVENOUS

## 2016-04-18 MED ORDER — FOLIC ACID 1 MG PO TABS
1.0000 mg | ORAL_TABLET | Freq: Every day | ORAL | Status: DC
  Administered 2016-04-18 – 2016-04-23 (×6): 1 mg via ORAL
  Filled 2016-04-18 (×6): qty 1

## 2016-04-18 MED ORDER — ENOXAPARIN SODIUM 40 MG/0.4ML ~~LOC~~ SOLN
40.0000 mg | SUBCUTANEOUS | Status: DC
  Administered 2016-04-18: 40 mg via SUBCUTANEOUS
  Filled 2016-04-18: qty 0.4

## 2016-04-18 MED ORDER — IOPAMIDOL (ISOVUE-300) INJECTION 61%
INTRAVENOUS | Status: AC
  Administered 2016-04-18: 100 mL
  Filled 2016-04-18: qty 100

## 2016-04-18 MED ORDER — ONDANSETRON HCL 4 MG/2ML IJ SOLN
4.0000 mg | Freq: Once | INTRAMUSCULAR | Status: AC
  Administered 2016-04-18: 4 mg via INTRAVENOUS
  Filled 2016-04-18: qty 2

## 2016-04-18 MED ORDER — FENOFIBRATE 160 MG PO TABS
160.0000 mg | ORAL_TABLET | Freq: Every day | ORAL | Status: DC
  Administered 2016-04-19 – 2016-04-23 (×5): 160 mg via ORAL
  Filled 2016-04-18 (×5): qty 1

## 2016-04-18 MED ORDER — HYDROMORPHONE HCL 1 MG/ML IJ SOLN
1.0000 mg | INTRAMUSCULAR | Status: DC | PRN
  Administered 2016-04-18 – 2016-04-19 (×4): 1 mg via INTRAVENOUS
  Filled 2016-04-18 (×4): qty 1

## 2016-04-18 MED ORDER — SODIUM CHLORIDE 0.9 % IV SOLN
INTRAVENOUS | Status: DC
Start: 2016-04-18 — End: 2016-04-19
  Administered 2016-04-18 – 2016-04-19 (×2): via INTRAVENOUS

## 2016-04-18 MED ORDER — ONDANSETRON HCL 4 MG/2ML IJ SOLN
4.0000 mg | Freq: Four times a day (QID) | INTRAMUSCULAR | Status: DC | PRN
  Administered 2016-04-18: 4 mg via INTRAVENOUS
  Filled 2016-04-18: qty 2

## 2016-04-18 MED ORDER — LORAZEPAM 2 MG/ML IJ SOLN: 0.0000 mg | Freq: Four times a day (QID) | INTRAMUSCULAR | Status: AC

## 2016-04-18 MED ORDER — ONDANSETRON HCL 4 MG PO TABS
4.0000 mg | ORAL_TABLET | Freq: Four times a day (QID) | ORAL | Status: DC | PRN
  Administered 2016-04-22 – 2016-04-23 (×2): 4 mg via ORAL
  Filled 2016-04-18 (×2): qty 1

## 2016-04-18 MED ORDER — PANTOPRAZOLE SODIUM 40 MG IV SOLR
40.0000 mg | INTRAVENOUS | Status: DC
  Administered 2016-04-18 – 2016-04-21 (×3): 40 mg via INTRAVENOUS
  Filled 2016-04-18 (×3): qty 40

## 2016-04-18 NOTE — ED Notes (Signed)
EDP at bedside  

## 2016-04-18 NOTE — ED Notes (Signed)
Pt. Transported to CT at this time.  

## 2016-04-18 NOTE — ED Provider Notes (Signed)
CSN: [id number]     Arrival date & time 04/18/16  1421 History   None    Chief Complaint  Patient presents with  . CP/epigastric pain      (Consider location/radiation/quality/duration/timing/severity/associated sxs/prior Treatment) Patient is a 40 y.o. male presenting with abdominal pain. The history is provided by the patient and medical records. No language interpreter was used.  Abdominal Pain Pain location:  Epigastric Pain quality: stabbing   Pain radiates to:  Back Pain severity:  Severe Onset quality:  Sudden Duration:  2 hours Timing:  Constant Progression:  Unchanged Chronicity:  New Context: not sick contacts, not suspicious food intake and not trauma   Context comment:  Drank alcohol heavily 3 days ago. 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 GERD (gastroesophageal reflux disease)   . Hyperlipidemia   . IBS (irritable bowel syndrome)   . Hypertension   . Pain in lower limb 03/02/2014  . 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 H/O hiatal hernia   . Migraine     "probably monthly" (07/22/2014)  . Pancreatitis   . Hepatic steatosis   . Kidney cyst, acquired    Past Surgical History  Procedure Laterality Date  . Wrist fracture surgery Right 2008    "got plate and screws"  . Carpal tunnel release Right 2008  . Refractive surgery Bilateral 2013  . Knee arthroscopy Right 1992  . Cyst excision Right 2015    "2nd toe"  . Upper gi endoscopy      chest pain  . Colonoscopy      IBS  . Upper gastrointestinal endoscopy     Family History  Problem Relation Age of Onset  . Hypertension Mother   .  Hyperlipidemia Mother   . Hypertension Father   . Colon cancer Neg Hx    Social History  Substance Use Topics  . Smoking status: Never Smoker   . Smokeless tobacco: Never Used  . Alcohol Use: 14.4 oz/week    24 Cans of beer per week     Comment: 07/22/2014 "case of beer/week or less; I don't drink qd; drink mostly on the weekends"    Review of Systems  Constitutional: Positive for diaphoresis. Negative for fever and chills.  HENT: Negative for congestion and rhinorrhea.   Eyes: Negative for visual disturbance.  Respiratory: Negative for cough and shortness of breath.   Cardiovascular: Negative for chest pain and palpitations.  Gastrointestinal: Positive for nausea, abdominal pain and diarrhea. Negative for vomiting, constipation, melena and hematochezia.  Genitourinary: Negative for dysuria and difficulty urinating.  Musculoskeletal: Positive for back pain. Negative for neck pain.  Skin: Positive for pallor. Negative for rash.  Neurological: Negative for dizziness and headaches.  Psychiatric/Behavioral: Negative for confusion.      Allergies  Review of patient's allergies indicates no known allergies.  Home Medications   Prior to Admission medications   Medication Sig Start Date End Date Taking? Authorizing Provider  BENICAR 40 MG tablet Take 1 tablet by mouth  daily 09/29/15   Midge Minium, MD  clidinium-chlordiazePOXIDE (LIBRAX) 5-2.5 MG capsule Take 1 capsule by mouth  daily 09/29/15   Midge Minium, MD  fenofibrate (TRICOR) 145 MG tablet Take 1 tablet by mouth  daily 09/29/15   Midge Minium, MD  fluticasone [facility]) 50 MCG/ACT nasal spray Place 2 sprays into both nostrils daily. 12/24/15   Midge Minium, MD  pantoprazole (PROTONIX) 40 MG tablet Take 1 tablet (40 mg total) by mouth daily. 02/17/15   Midge Minium, MD  temazepam (RESTORIL) 15 MG capsule Take 1 capsule (15 mg total) by mouth at bedtime as needed for sleep. 04/17/14   Ricard Dillon, MD    BP 157/114 mmHg  Pulse 107  Temp(Src) 97.5 F (36.4 C)  Resp 22  Ht 6' (1.829 m)  Wt 100.699 kg  BMI 30.10 kg/m2  SpO2 98% Physical Exam  Constitutional: He is oriented to person, place, and time. He appears well-developed and well-nourished. He appears distressed.  HENT:  Head: Normocephalic and atraumatic.  Eyes: EOM are normal. Pupils are equal, round, and reactive to light.  Neck: Normal range of motion. Neck supple.  Cardiovascular: Normal rate, regular rhythm and intact distal pulses.   Pulmonary/Chest: Effort normal and breath sounds normal. No respiratory distress.  Abdominal: Soft. He exhibits no distension. There is tenderness in the epigastric area. There is no CVA tenderness.  Musculoskeletal: Normal range of motion. He exhibits no edema or tenderness.  Neurological: He is alert and oriented to person, place, and time.  Skin: Skin is warm and dry. No rash noted.  Psychiatric: He has a normal mood and affect.  Nursing note and vitals reviewed.   ED Course  Procedures (including critical care time) Labs Review Labs Reviewed  LIPASE, BLOOD - Abnormal; Notable for the following:    Lipase >3000 (*)    All other components within normal limits  COMPREHENSIVE METABOLIC PANEL - Abnormal; Notable for the following:    Glucose, Bld 121 (*)    AST 44 (*)    All other components within normal limits  CBC - Abnormal; Notable for the following:    WBC 15.4 (*)    All other components within normal limits  URINALYSIS, ROUTINE W REFLEX MICROSCOPIC (NOT AT [facility]) - Abnormal; Notable for the following:    APPearance HAZY (*)    All other components within normal limits  CBC - Abnormal; Notable for the following:    WBC 12.1 (*)    All other components within normal limits  CREATININE, SERUM - Abnormal; Notable for the following:    Creatinine, Ser 1.27 (*)    All other components within normal limits  GLUCOSE, CAPILLARY - Abnormal; Notable for the following:     Glucose-Capillary 128 (*)    All other components within normal limits  GLUCOSE, CAPILLARY - Abnormal; Notable for the following:    Glucose-Capillary 127 (*)    All other components within normal limits  TROPONIN I  BASIC METABOLIC PANEL  CBC  HEPATIC FUNCTION PANEL    Imaging Review Ct Abdomen Pelvis W Contrast  04/18/2016  CLINICAL DATA:  Epigastric pain for 2 hours, history of pancreatitis EXAM: CT ABDOMEN AND PELVIS WITH CONTRAST TECHNIQUE: Multidetector CT imaging of the abdomen and pelvis was performed using the standard protocol following bolus administration of intravenous contrast. CONTRAST:  100 cc ISOVUE-300 IOPAMIDOL (ISOVUE-300) INJECTION 61% COMPARISON:  07/22/2014 FINDINGS: Lower chest: Lung bases shows bilateral lower lobe posterior dependent atelectasis. Mild atelectasis noted left base anteriorly. Hepatobiliary: There is fatty infiltration of the liver. No calcified gallstones are noted within gallbladder.  No focal hepatic mass. Pancreas: There is diffuse mild prominent pancreas. There is significant peripancreatic stranding and peripancreatic fluid. There is some fluid surrounding the distal duodenum. Free fluid is noted bilateral posterior upper mesentery. Some free fluid is noted in right mid mesentery and surrounding the central mesentery. There is no evidence of pancreatic phlegmon or abscess. No pancreatic pseudocyst. There is some perigastric and lateral free fluid in left upper abdomen. Spleen: No focal splenic mass. Adrenals/Urinary Tract: No adrenal gland mass. Kidneys are symmetrical in size and enhancement. There is a central cyst in midpole of the right kidney measures 2.2 cm. No hydronephrosis or hydroureter. Delayed renal images shows bilateral renal symmetrical excretion. Bilateral visualized proximal ureter is unremarkable. Moderate distended urinary bladder. Stomach/Bowel: No small bowel os obstruction. Moderate gastric distension with fluid probable mild gastric  ileus. No thickened or dilated small bowel loops. Normal appendix. No pericecal inflammation. No distal colonic obstruction. The colon is empty collapsed. Vascular/Lymphatic: Abdominal aorta, celiac trunk SMA portal vein and IVC are patent. No aortic aneurysm. No retroperitoneal adenopathy. Reproductive: Prostate gland and seminal vesicles are unremarkable. Other: There is no evidence of free abdominal air. Small amount of free fluid noted within posterior pelvis and right anterior pelvis. Musculoskeletal: No destructive bony lesions are noted. Sagittal images of the spine shows mild degenerative changes thoracolumbar spine. IMPRESSION: 1. There is diffuse mild prominent pancreas with significant peripancreatic stranding and peripancreatic fluid. Free fluid is noted surrounding the stomach proximal duodenum and in upper abdomen. No evidence of pancreatic phlegmon or pancreatic pseudocyst. 2. No calcified gallstones are noted within gallbladder. Fatty infiltration of the liver is noted. 3. No hydronephrosis or hydroureter. There is a right renal cyst measures 2.2 cm. Bilateral renal symmetrical excretion. 4. No small bowel or colonic obstruction. 5. There is moderate gastric distension with fluid probable gastric ileus. No evidence of gastric outlet obstruction. 6. No evidence of free abdominal air. 7. Moderate distended urinary bladder. Electronically Signed   By: Lahoma Crocker M.D.   On: 04/18/2016 19:43   Dg Abd Acute W/chest  04/18/2016  CLINICAL DATA:  Diarrhea starting last night, abdominal pain and emesis starting today. EXAM: DG ABDOMEN ACUTE W/ 1V CHEST COMPARISON:  None. FINDINGS: Low lung volumes are present, causing crowding of the pulmonary vasculature. No free intraperitoneal gas beneath the hemidiaphragms. Small amount of gas and stool in the ascending colon. Most of the abdomen is gasless. IMPRESSION: 1. The small bowel is gasless there is only a small amount of gas and stool in the colon. No  abnormality is seen but the absence of gas within the bowel reduces negative predictive value. 2. Low lung volumes. Electronically Signed   By: Van Clines M.D.   On: 04/18/2016 16:15   I have personally reviewed and evaluated these images and lab results as part of my medical decision-making.   EKG Interpretation   Date/Time:  Tuesday Apr 18 2016 14:26:40 EDT Ventricular Rate:  94 PR Interval:  150 QRS Duration: 78 QT Interval:  358 QTC Calculation: 447 R Axis:   51 Text Interpretation:  Normal sinus rhythm Cannot rule out Anterior infarct  , age undetermined No significant change since last tracing Confirmed by  [facility]  MD, Loree Fee ([id number]) on 04/18/2016 2:39:10 PM      MDM   Final diagnoses:  Idiopathic acute pancreatitis without infection or necrosis    Afebrile but tachycardic and mildly hypertensive. He appears very uncomfortable on exam, w/ diaphoresis and significant epigastric tenderness to  palpation. IV fluids, antiemetics and pain medicine given. The patient appeared acutely uncomfortable acute abdominal x-ray ordered to rule out acute perforation. No free air under the diaphragm. Small bowel is gasless which does limit the exam. Labs obtained significant for leukocytosis and significantly elevated lipase > 3000. CT imaging of abdomen and pelvis obtained and shows signs consistent with acute pancreatitis without signs of abscess or pseudocyst. No bowel obstruction or gallstones identified.  Hospitalist was consulted for admission and triaged patient. Patient will be admitted by rest and further management of acute pancreatitis.  Was seen and discussed with Dr. Wyvonnia Dusky, ED attending.    Gibson Ramp, MD 04/19/16 0300  Ezequiel Essex, MD 04/19/16 [phone]

## 2016-04-18 NOTE — Progress Notes (Signed)
Admission note:  Arrival Method: Pt arrived on stretcher from ED Mental Orientation: Alert and oriented x 4 Telemetry: Telemetry box 6e29 applied. CCMT notified. Verified with Lita Mains, NT Assessment: Completed, see flowsheets Skin: Dry and intact. Assessed with Percell Boston, RN IV: Left AC. Pain: Pt states pain is 8/10 in his abdomen. MD notified. Order for dilaudid received. See MAR.  Tubes: N/A Safety Measures: Bed in lowest position, non-slip socks placed, call light within reach Fall Prevention Safety Plan: Reviewed with patient Admission Screening: Completed 6700 Orientation: Patient has been oriented to the unit, staff and to the room. Orders have been reviewed and implemented. Family is at bedside. Call light is within reach, will continue to monitor the patient closely.   Shelbie Hutching, RN, BSN

## 2016-04-18 NOTE — ED Notes (Signed)
Pt. Transported to xray 

## 2016-04-18 NOTE — ED Notes (Signed)
Patient attempted to urinate but stated that he was unable "due to pain."

## 2016-04-18 NOTE — H&P (Signed)
History and Physical    [name] [id number] DOB: Mar 08, 1976 DOA: 04/18/2016  PCP: Annye Asa, MD  Patient coming from: Home.  Chief Complaint: Abdominal pain.  HPI: Christopher [name] is a 40 y.o. male with medical history significant of alcohol abuse with previous history of pancreatitis 2 years ago presents to the ER because of abdominal pain. Patient started having severe abdominal pain epigastric area radiating to the back since afternoon today. Denies any chest pain or shortness of breath. Had one episode of diarrhea last night. Patient states he had alcohol binge drinking last weekend. Denies any blood in the vomitus. CT abdomen and pelvis shows features consistent with acute cryptitis and patient's lipase is more than 3000. Patient is hemodynamically stable and will be admitted for acute pancreatitis.   ED Course: CT abdomen and pelvis shows features consistent with pancreatitis.  Review of Systems: As per HPI otherwise 10 point review of systems negative.    Past Medical History  Diagnosis Date  . GERD (gastroesophageal reflux disease)   . Hyperlipidemia   . IBS (irritable bowel syndrome)   . Hypertension   . Pain in lower limb 03/02/2014  . 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 H/O hiatal hernia   . Migraine     "probably monthly" (07/22/2014)  . Pancreatitis   . Hepatic steatosis   . Kidney cyst, acquired     Past Surgical History  Procedure Laterality Date  . Wrist fracture surgery Right 2008    "got plate and screws"  . Carpal tunnel release Right 2008  . Refractive surgery Bilateral 2013  . Knee arthroscopy Right 1992  . Cyst excision Right 2015    "2nd toe"  . Upper gi endoscopy      chest pain  . Colonoscopy      IBS  . Upper gastrointestinal endoscopy       reports that he has never smoked. He has never used smokeless tobacco. He reports that he drinks about 14.4 oz of alcohol per week. He reports that he does not use illicit drugs.  No Known  Allergies  Family History  Problem Relation Age of Onset  . Hypertension Mother   . Hyperlipidemia Mother   . Hypertension Father   . Colon cancer Neg Hx     Prior to Admission medications   Medication Sig Start Date End Date Taking? Authorizing Provider  BENICAR 40 MG tablet Take 1 tablet by mouth  daily 09/29/15  Yes Midge Minium, MD  clidinium-chlordiazePOXIDE [facility]) 5-2.5 MG capsule Take 1 capsule by mouth  daily 09/29/15  Yes Midge Minium, MD  fenofibrate (TRICOR) 145 MG tablet Take 1 tablet by mouth  daily 09/29/15  Yes Midge Minium, MD  fluticasone (FLONASE) 50 MCG/ACT nasal spray Place 2 sprays into both nostrils daily. 12/24/15  Yes Midge Minium, MD  ibuprofen (ADVIL,MOTRIN) 200 MG tablet Take 200 mg by mouth every 6 (six) hours as needed for moderate pain.   Yes Historical Provider, MD  naproxen sodium (ANAPROX) 220 MG tablet Take 220 mg by mouth 2 (two) times daily as needed (pain).   Yes Historical Provider, MD  pantoprazole (PROTONIX) 40 MG tablet Take 1 tablet (40 mg total) by mouth daily. 02/17/15  Yes Midge Minium, MD  temazepam (RESTORIL) 15 MG capsule Take 1 capsule (15 mg total) by mouth at bedtime as needed for sleep. 04/17/14  Yes Ricard Dillon, MD    Physical Exam: Filed Vitals:   04/18/16  1800 04/18/16 1830 04/18/16 1845 04/18/16 1945  BP: 181/122 174/112 174/112 181/108  Pulse: 81 68 94 84  Temp:      Resp: 21 25 27 27   Height:      Weight:      SpO2: 95% 91% 94% 92%      Constitutional: Not in distress. Filed Vitals:   04/18/16 1800 04/18/16 1830 04/18/16 1845 04/18/16 1945  BP: 181/122 174/112 174/112 181/108  Pulse: 81 68 94 84  Temp:      Resp: 21 25 27 27   Height:      Weight:      SpO2: 95% 91% 94% 92%   Eyes: Anicteric. No pallor. ENMT: No discharge from the ears eyes nose and mouth. Neck: No mass felt. No neck rigidity. Respiratory: No rhonchi or crepitations. Cardiovascular: S1-S2 heard. Abdomen: Mild  epigastric tenderness no guarding or rigidity. Musculoskeletal: No edema. Skin: No rash. Neurologic: Alert awake oriented to time place and person. Moves all extremities. Psychiatric: Appears normal.   Labs on Admission: I have personally reviewed following labs and imaging studies  CBC:  Recent Labs Lab 04/18/16 1436  WBC 15.4*  HGB 13.8  HCT 42.8  MCV 83.8  PLT [id number]   Basic Metabolic Panel:  Recent Labs Lab 04/18/16 1436  NA 139  K 3.5  CL 105  CO2 24  GLUCOSE 121*  BUN 11  CREATININE 1.23  CALCIUM 9.8   GFR: Estimated Creatinine Clearance: 98 mL/min (by C-G formula based on Cr of 1.23). Liver Function Tests:  Recent Labs Lab 04/18/16 1436  AST 44*  ALT 61  ALKPHOS 46  BILITOT 0.5  PROT 7.6  ALBUMIN 4.4    Recent Labs Lab 04/18/16 1436  LIPASE >3000*   No results for input(s): AMMONIA in the last 168 hours. Coagulation Profile: No results for input(s): INR, PROTIME in the last 168 hours. Cardiac Enzymes:  Recent Labs Lab 04/18/16 1436  TROPONINI <0.03   BNP (last 3 results) No results for input(s): PROBNP in the last 8760 hours. HbA1C: No results for input(s): HGBA1C in the last 72 hours. CBG: No results for input(s): GLUCAP in the last 168 hours. Lipid Profile: No results for input(s): CHOL, HDL, LDLCALC, TRIG, CHOLHDL, LDLDIRECT in the last 72 hours. Thyroid Function Tests: No results for input(s): TSH, T4TOTAL, FREET4, T3FREE, THYROIDAB in the last 72 hours. Anemia Panel: No results for input(s): VITAMINB12, FOLATE, FERRITIN, TIBC, IRON, RETICCTPCT in the last 72 hours. Urine analysis:    Component Value Date/Time   COLORURINE YELLOW 04/18/2016 1750   APPEARANCEUR HAZY* 04/18/2016 1750   LABSPEC 1.016 04/18/2016 1750   PHURINE 7.5 04/18/2016 1750   GLUCOSEU NEGATIVE 04/18/2016 1750   HGBUR NEGATIVE 04/18/2016 1750   HGBUR negative 02/15/2009 0833   BILIRUBINUR NEGATIVE 04/18/2016 1750   BILIRUBINUR Neg 04/01/2015 1937    KETONESUR NEGATIVE 04/18/2016 1750   PROTEINUR NEGATIVE 04/18/2016 1750   PROTEINUR Neg 04/01/2015 1937   UROBILINOGEN 0.2 04/01/2015 1937   UROBILINOGEN 1.0 07/24/2014 0313   NITRITE NEGATIVE 04/18/2016 1750   NITRITE Neg 04/01/2015 1937   LEUKOCYTESUR NEGATIVE 04/18/2016 1750   Sepsis Labs: @LABRCNTIP (procalcitonin:4,lacticidven:4) )No results found for this or any previous visit (from the past 240 hour(s)).   Radiological Exams on Admission: Ct Abdomen Pelvis W Contrast  04/18/2016  CLINICAL DATA:  Epigastric pain for 2 hours, history of pancreatitis EXAM: CT ABDOMEN AND PELVIS WITH CONTRAST TECHNIQUE: Multidetector CT imaging of the abdomen and pelvis was performed using the standard  protocol following bolus administration of intravenous contrast. CONTRAST:  100 cc ISOVUE-300 IOPAMIDOL (ISOVUE-300) INJECTION 61% COMPARISON:  07/22/2014 FINDINGS: Lower chest: Lung bases shows bilateral lower lobe posterior dependent atelectasis. Mild atelectasis noted left base anteriorly. Hepatobiliary: There is fatty infiltration of the liver. No calcified gallstones are noted within gallbladder. No focal hepatic mass. Pancreas: There is diffuse mild prominent pancreas. There is significant peripancreatic stranding and peripancreatic fluid. There is some fluid surrounding the distal duodenum. Free fluid is noted bilateral posterior upper mesentery. Some free fluid is noted in right mid mesentery and surrounding the central mesentery. There is no evidence of pancreatic phlegmon or abscess. No pancreatic pseudocyst. There is some perigastric and lateral free fluid in left upper abdomen. Spleen: No focal splenic mass. Adrenals/Urinary Tract: No adrenal gland mass. Kidneys are symmetrical in size and enhancement. There is a central cyst in midpole of the right kidney measures 2.2 cm. No hydronephrosis or hydroureter. Delayed renal images shows bilateral renal symmetrical excretion. Bilateral visualized proximal  ureter is unremarkable. Moderate distended urinary bladder. Stomach/Bowel: No small bowel os obstruction. Moderate gastric distension with fluid probable mild gastric ileus. No thickened or dilated small bowel loops. Normal appendix. No pericecal inflammation. No distal colonic obstruction. The colon is empty collapsed. Vascular/Lymphatic: Abdominal aorta, celiac trunk SMA portal vein and IVC are patent. No aortic aneurysm. No retroperitoneal adenopathy. Reproductive: Prostate gland and seminal vesicles are unremarkable. Other: There is no evidence of free abdominal air. Small amount of free fluid noted within posterior pelvis and right anterior pelvis. Musculoskeletal: No destructive bony lesions are noted. Sagittal images of the spine shows mild degenerative changes thoracolumbar spine. IMPRESSION: 1. There is diffuse mild prominent pancreas with significant peripancreatic stranding and peripancreatic fluid. Free fluid is noted surrounding the stomach proximal duodenum and in upper abdomen. No evidence of pancreatic phlegmon or pancreatic pseudocyst. 2. No calcified gallstones are noted within gallbladder. Fatty infiltration of the liver is noted. 3. No hydronephrosis or hydroureter. There is a right renal cyst measures 2.2 cm. Bilateral renal symmetrical excretion. 4. No small bowel or colonic obstruction. 5. There is moderate gastric distension with fluid probable gastric ileus. No evidence of gastric outlet obstruction. 6. No evidence of free abdominal air. 7. Moderate distended urinary bladder. Electronically Signed   By: Lahoma Crocker M.D.   On: 04/18/2016 19:43   Dg Abd Acute W/chest  04/18/2016  CLINICAL DATA:  Diarrhea starting last night, abdominal pain and emesis starting today. EXAM: DG ABDOMEN ACUTE W/ 1V CHEST COMPARISON:  None. FINDINGS: Low lung volumes are present, causing crowding of the pulmonary vasculature. No free intraperitoneal gas beneath the hemidiaphragms. Small amount of gas and stool in  the ascending colon. Most of the abdomen is gasless. IMPRESSION: 1. The small bowel is gasless there is only a small amount of gas and stool in the colon. No abnormality is seen but the absence of gas within the bowel reduces negative predictive value. 2. Low lung volumes. Electronically Signed   By: Van Clines M.D.   On: 04/18/2016 16:15    EKG: Independently reviewed. Normal sinus rhythm.  Assessment/Plan Principal Problem:   Acute pancreatitis Active Problems:   Hyperlipidemia   Essential hypertension   Alcohol abuse   Pancreatitis   Acute alcoholic pancreatitis    #1. Acute alcoholic pancreatitis - patient has been kept nothing by mouth except medications for bowel rest and on IV fluids pain relief medications. Patient has had recent lipid panel done last week with triglycerides around  151. No definite signs of any gallbladder stones in the CT scan. #2. Alcohol abuse - patient advised to quit alcohol and has been placed on CIWA protocol. IV Ativan. #3. Hypertension uncontrolled probably secondary to pain - patient has intermittent pain relief medications and when necessary IV hydralazine until patient can reliably take orals. #4. Hyperlipidemia - recent lipid panel done last showed triglycerides of 151 continue TriCor.   DVT prophylaxis: Lovenox. Code Status: Full code.  Family Communication: Wife at the bedside.  Disposition Plan: Home.  Consults called: None.  Admission status: Inpatient. Telemetry. Likely stay 2 days.    Rise Patience MD [facility] Pager [phone].  If 7PM-7AM, please contact night-coverage [email or web address] Password [email or web address]  04/18/2016, 8:25 PM

## 2016-04-18 NOTE — ED Notes (Signed)
Patient arrived pale and diaphoretic complaining of severe epigastric pain x 2 hours. Reports history of pancreatitis and drank heavy this past weekend. Restless in chair during assessmernt

## 2016-04-19 DIAGNOSIS — I10 Essential (primary) hypertension: Secondary | ICD-10-CM

## 2016-04-19 DIAGNOSIS — K852 Alcohol induced acute pancreatitis without necrosis or infection: Principal | ICD-10-CM

## 2016-04-19 DIAGNOSIS — E785 Hyperlipidemia, unspecified: Secondary | ICD-10-CM

## 2016-04-19 LAB — CBC
HEMATOCRIT: 41.8 % (ref 39.0–52.0)
HEMOGLOBIN: 13.3 g/dL (ref 13.0–17.0)
MCH: 27.5 pg (ref 26.0–34.0)
MCHC: 31.8 g/dL (ref 30.0–36.0)
MCV: 86.5 fL (ref 78.0–100.0)
Platelets: 315 10*3/uL (ref 150–400)
RBC: 4.83 MIL/uL (ref 4.22–5.81)
RDW: 14.1 % (ref 11.5–15.5)
WBC: 11.4 10*3/uL — AB (ref 4.0–10.5)

## 2016-04-19 LAB — HEPATIC FUNCTION PANEL
ALK PHOS: 38 U/L (ref 38–126)
ALT: 40 U/L (ref 17–63)
AST: 40 U/L (ref 15–41)
Albumin: 3.4 g/dL — ABNORMAL LOW (ref 3.5–5.0)
BILIRUBIN DIRECT: 0.3 mg/dL (ref 0.1–0.5)
BILIRUBIN TOTAL: 1.2 mg/dL (ref 0.3–1.2)
Indirect Bilirubin: 0.9 mg/dL (ref 0.3–0.9)
Total Protein: 6.3 g/dL — ABNORMAL LOW (ref 6.5–8.1)

## 2016-04-19 LAB — BASIC METABOLIC PANEL
ANION GAP: 9 (ref 5–15)
BUN: 14 mg/dL (ref 6–20)
CHLORIDE: 105 mmol/L (ref 101–111)
CO2: 25 mmol/L (ref 22–32)
Calcium: 8.1 mg/dL — ABNORMAL LOW (ref 8.9–10.3)
Creatinine, Ser: 1.31 mg/dL — ABNORMAL HIGH (ref 0.61–1.24)
GFR calc Af Amer: >60 mL/min (ref >60)
GFR calc non Af Amer: >60 mL/min (ref >60)
Glucose, Bld: 149 mg/dL — ABNORMAL HIGH (ref 65–99)
POTASSIUM: 4.5 mmol/L (ref 3.5–5.1)
SODIUM: 139 mmol/L (ref 135–145)

## 2016-04-19 LAB — GLUCOSE, CAPILLARY
Glucose-Capillary: 127 mg/dL — ABNORMAL HIGH (ref 65–99)
Glucose-Capillary: 141 mg/dL — ABNORMAL HIGH (ref 65–99)
Glucose-Capillary: 150 mg/dL — ABNORMAL HIGH (ref 65–99)
Glucose-Capillary: 152 mg/dL — ABNORMAL HIGH (ref 65–99)

## 2016-04-19 MED ORDER — HYDROMORPHONE HCL 1 MG/ML IJ SOLN
1.0000 mg | Freq: Once | INTRAMUSCULAR | Status: AC
  Administered 2016-04-19: 1 mg via INTRAVENOUS
  Filled 2016-04-19: qty 1

## 2016-04-19 MED ORDER — HYDROMORPHONE HCL 1 MG/ML IJ SOLN
1.5000 mg | INTRAMUSCULAR | Status: DC | PRN
  Administered 2016-04-19 – 2016-04-22 (×17): 1.5 mg via INTRAVENOUS
  Filled 2016-04-19 (×17): qty 2

## 2016-04-19 MED ORDER — ENOXAPARIN SODIUM 60 MG/0.6ML ~~LOC~~ SOLN
50.0000 mg | SUBCUTANEOUS | Status: DC
  Administered 2016-04-19 – 2016-04-22 (×4): 50 mg via SUBCUTANEOUS
  Filled 2016-04-19 (×4): qty 0.6

## 2016-04-19 MED ORDER — GI COCKTAIL ~~LOC~~
30.0000 mL | Freq: Once | ORAL | Status: AC
Start: 2016-04-19 — End: 2016-04-19
  Administered 2016-04-19: 30 mL via ORAL
  Filled 2016-04-19: qty 30

## 2016-04-19 MED ORDER — KCL IN DEXTROSE-NACL 20-5-0.45 MEQ/L-%-% IV SOLN
INTRAVENOUS | Status: DC
  Administered 2016-04-19 – 2016-04-23 (×7): via INTRAVENOUS
  Filled 2016-04-19 (×15): qty 1000

## 2016-04-19 NOTE — Progress Notes (Signed)
PROGRESS NOTE    Christopher [name]  [id number] DOB: 03-Jan-1976 DOA: 04/18/2016 PCP: Annye Asa, MD   Brief Narrative:  40 y/o who presents with alcohol induced pancreatitis   Assessment & Plan:   Principal Problem:   Acute pancreatitis - bowel rest -Supportive therapy patient complaining of continued pain with minimal relief on 1 mg of Dilaudid. As such will increase Dilaudid dose - Continue PPI  Active Problems:   Hyperlipidemia - Stable no chest pain reported    Essential hypertension - Continue hydralazine as needed    Alcohol abuse - Continue Ativan as needed  DVT prophylaxis:  Lovenox Code Status: full Family Communication: d/c spouse at bedside Disposition Plan: Pending improvement in abdominal discomfort.   Consultants:   None   Procedures: None   Antimicrobials: None   Subjective: Patient has no new complaints. No acute issues overnight  Objective: Filed Vitals:   04/18/16 2015 04/18/16 2117 04/19/16 0605 04/19/16 1012  BP: 169/103 160/76 154/106 116/100  Pulse: 69 64 108 118  Temp:  98.2 F (36.8 C) 98.2 F (36.8 C) 98.4 F (36.9 C)  TempSrc:  Oral Oral Oral  Resp: 24 22 20 20   Height:  6' (1.829 m)    Weight:  101.606 kg (224 lb)    SpO2: 94% 94% 94% 96%    Intake/Output Summary (Last 24 hours) at 04/19/16 1642 Last data filed at 04/19/16 0605  Gross per 24 hour  Intake 1041.67 ml  Output    775 ml  Net 266.67 ml   Filed Weights   04/18/16 1434 04/18/16 2117  Weight: 100.699 kg (222 lb) 101.606 kg (224 lb)    Examination:  General exam: Appears calm and comfortable  Respiratory system: Clear to auscultation. Respiratory effort normal.No wheezes Cardiovascular system: S1 & S2 heard, RRR. No JVD, murmurs, rubs, gallops or clicks. No pedal edema. Gastrointestinal system: Abdomen is nondistended, soft and positive epigastric tenderness. No organomegaly or masses felt.  Central nervous system: Alert and oriented. No  focal neurological deficits. Extremities: Symmetric 5 x 5 power. Skin: No rashes, lesions or ulcers Psychiatry: Judgement and insight appear normal. Mood & affect appropriate.   Data Reviewed: I have personally reviewed following labs and imaging studies  CBC:  Recent Labs Lab 04/18/16 1436 04/18/16 2046 04/19/16 0517  WBC 15.4* 12.1* 11.4*  HGB 13.8 13.4 13.3  HCT 42.8 41.4 41.8  MCV 83.8 84.5 86.5  PLT 383 310 [id number]   Basic Metabolic Panel:  Recent Labs Lab 04/18/16 1436 04/18/16 2046 04/19/16 0517  NA 139  --  139  K 3.5  --  4.5  CL 105  --  105  CO2 24  --  25  GLUCOSE 121*  --  149*  BUN 11  --  14  CREATININE 1.23 1.27* 1.31*  CALCIUM 9.8  --  8.1*   GFR: Estimated Creatinine Clearance: 92.5 mL/min (by C-G formula based on Cr of 1.31). Liver Function Tests:  Recent Labs Lab 04/18/16 1436 04/19/16 0517  AST 44* 40  ALT 61 40  ALKPHOS 46 38  BILITOT 0.5 1.2  PROT 7.6 6.3*  ALBUMIN 4.4 3.4*    Recent Labs Lab 04/18/16 1436  LIPASE >3000*   No results for input(s): AMMONIA in the last 168 hours. Coagulation Profile: No results for input(s): INR, PROTIME in the last 168 hours. Cardiac Enzymes:  Recent Labs Lab 04/18/16 1436  TROPONINI <0.03   BNP (last 3 results) No results for input(s): PROBNP in the  last 8760 hours. HbA1C: No results for input(s): HGBA1C in the last 72 hours. CBG:  Recent Labs Lab 04/18/16 2132 04/19/16 0021 04/19/16 0602 04/19/16 0752 04/19/16 1159  GLUCAP 128* 127* 150* 141* 152*   Lipid Profile: No results for input(s): CHOL, HDL, LDLCALC, TRIG, CHOLHDL, LDLDIRECT in the last 72 hours. Thyroid Function Tests: No results for input(s): TSH, T4TOTAL, FREET4, T3FREE, THYROIDAB in the last 72 hours. Anemia Panel: No results for input(s): VITAMINB12, FOLATE, FERRITIN, TIBC, IRON, RETICCTPCT in the last 72 hours. Sepsis Labs: No results for input(s): PROCALCITON, LATICACIDVEN in the last 168 hours.  No results  found for this or any previous visit (from the past 240 hour(s)).       Radiology Studies: Ct Abdomen Pelvis W Contrast  04/18/2016  CLINICAL DATA:  Epigastric pain for 2 hours, history of pancreatitis EXAM: CT ABDOMEN AND PELVIS WITH CONTRAST TECHNIQUE: Multidetector CT imaging of the abdomen and pelvis was performed using the standard protocol following bolus administration of intravenous contrast. CONTRAST:  100 cc ISOVUE-300 IOPAMIDOL (ISOVUE-300) INJECTION 61% COMPARISON:  07/22/2014 FINDINGS: Lower chest: Lung bases shows bilateral lower lobe posterior dependent atelectasis. Mild atelectasis noted left base anteriorly. Hepatobiliary: There is fatty infiltration of the liver. No calcified gallstones are noted within gallbladder. No focal hepatic mass. Pancreas: There is diffuse mild prominent pancreas. There is significant peripancreatic stranding and peripancreatic fluid. There is some fluid surrounding the distal duodenum. Free fluid is noted bilateral posterior upper mesentery. Some free fluid is noted in right mid mesentery and surrounding the central mesentery. There is no evidence of pancreatic phlegmon or abscess. No pancreatic pseudocyst. There is some perigastric and lateral free fluid in left upper abdomen. Spleen: No focal splenic mass. Adrenals/Urinary Tract: No adrenal gland mass. Kidneys are symmetrical in size and enhancement. There is a central cyst in midpole of the right kidney measures 2.2 cm. No hydronephrosis or hydroureter. Delayed renal images shows bilateral renal symmetrical excretion. Bilateral visualized proximal ureter is unremarkable. Moderate distended urinary bladder. Stomach/Bowel: No small bowel os obstruction. Moderate gastric distension with fluid probable mild gastric ileus. No thickened or dilated small bowel loops. Normal appendix. No pericecal inflammation. No distal colonic obstruction. The colon is empty collapsed. Vascular/Lymphatic: Abdominal aorta, celiac  trunk SMA portal vein and IVC are patent. No aortic aneurysm. No retroperitoneal adenopathy. Reproductive: Prostate gland and seminal vesicles are unremarkable. Other: There is no evidence of free abdominal air. Small amount of free fluid noted within posterior pelvis and right anterior pelvis. Musculoskeletal: No destructive bony lesions are noted. Sagittal images of the spine shows mild degenerative changes thoracolumbar spine. IMPRESSION: 1. There is diffuse mild prominent pancreas with significant peripancreatic stranding and peripancreatic fluid. Free fluid is noted surrounding the stomach proximal duodenum and in upper abdomen. No evidence of pancreatic phlegmon or pancreatic pseudocyst. 2. No calcified gallstones are noted within gallbladder. Fatty infiltration of the liver is noted. 3. No hydronephrosis or hydroureter. There is a right renal cyst measures 2.2 cm. Bilateral renal symmetrical excretion. 4. No small bowel or colonic obstruction. 5. There is moderate gastric distension with fluid probable gastric ileus. No evidence of gastric outlet obstruction. 6. No evidence of free abdominal air. 7. Moderate distended urinary bladder. Electronically Signed   By: Lahoma Crocker M.D.   On: 04/18/2016 19:43   Dg Abd Acute W/chest  04/18/2016  CLINICAL DATA:  Diarrhea starting last night, abdominal pain and emesis starting today. EXAM: DG ABDOMEN ACUTE W/ 1V CHEST COMPARISON:  None. FINDINGS:  Low lung volumes are present, causing crowding of the pulmonary vasculature. No free intraperitoneal gas beneath the hemidiaphragms. Small amount of gas and stool in the ascending colon. Most of the abdomen is gasless. IMPRESSION: 1. The small bowel is gasless there is only a small amount of gas and stool in the colon. No abnormality is seen but the absence of gas within the bowel reduces negative predictive value. 2. Low lung volumes. Electronically Signed   By: Van Clines M.D.   On: 04/18/2016 16:15         Scheduled Meds: . enoxaparin (LOVENOX) injection  50 mg Subcutaneous Q24H  . fenofibrate  160 mg Oral Daily  . fluticasone  2 spray Each Nare Daily  . folic acid  1 mg Oral Daily  . LORazepam  0-4 mg Intravenous Q6H   Followed by  . [START ON 04/20/2016] LORazepam  0-4 mg Intravenous Q12H  . multivitamin with minerals  1 tablet Oral Daily  . pantoprazole (PROTONIX) IV  40 mg Intravenous Q24H  . thiamine  100 mg Oral Daily   Or  . thiamine  100 mg Intravenous Daily   Continuous Infusions: . dextrose 5 % and 0.45 % NaCl with KCl 20 mEq/L 125 mL/hr at 04/19/16 1230     LOS: 1 day    Time spent: > 35 minutes    Velvet Bathe, MD [facility] Pager [phone]  If 7PM-7AM, please contact night-coverage [email or web address] Password [facility] 04/19/2016, 4:42 PM

## 2016-04-20 LAB — GLUCOSE, CAPILLARY
GLUCOSE-CAPILLARY: 131 mg/dL — AB (ref 65–99)
Glucose-Capillary: 122 mg/dL — ABNORMAL HIGH (ref 65–99)

## 2016-04-20 LAB — LIPASE, BLOOD: LIPASE: 371 U/L — AB (ref 11–51)

## 2016-04-20 NOTE — Progress Notes (Signed)
PROGRESS NOTE    Christopher [name]  [id number] DOB: 1976-04-20 DOA: 04/18/2016 PCP: Annye Asa, MD   Brief Narrative:  40 y/o who presents with alcohol induced pancreatitis. Currently on bowel rest   Assessment & Plan:   Principal Problem:   Acute pancreatitis - continue bowel rest -Supportive therapy patient complaining of continued pain with minimal relief on 1 mg of Dilaudid. As such will increase Dilaudid dose - Continue PPI  Active Problems:   Hyperlipidemia - Stable no chest pain reported    Essential hypertension - Continue hydralazine as needed    Alcohol abuse - Continue Ativan as needed  DVT prophylaxis:  Lovenox Code Status: full Family Communication: d/c father Disposition Plan: Pending improvement in abdominal discomfort.   Consultants:   None   Procedures: None   Antimicrobials: None   Subjective: Patient has no new complaints. No acute issues overnight. He reports improvement in condition  Objective: Filed Vitals:   04/19/16 2209 04/20/16 0608 04/20/16 0831 04/20/16 1708  BP: 146/92 142/87 165/101 145/84  Pulse: 121 110 113 102  Temp: 98.7 F (37.1 C) 99.5 F (37.5 C) 98 F (36.7 C) 99.8 F (37.7 C)  TempSrc: Oral Oral Oral Oral  Resp: 22 20 18 17   Height:      Weight:      SpO2: 91% 95% 95% 95%    Intake/Output Summary (Last 24 hours) at 04/20/16 1736 Last data filed at 04/20/16 1434  Gross per 24 hour  Intake 2262.5 ml  Output    800 ml  Net 1462.5 ml   Filed Weights   04/18/16 1434 04/18/16 2117  Weight: 100.699 kg (222 lb) 101.606 kg (224 lb)    Examination:  General exam: Appears calm and comfortable  Respiratory system: Clear to auscultation. Respiratory effort normal.No wheezes Cardiovascular system: S1 & S2 heard, RRR. No JVD, murmurs, rubs, gallops or clicks. No pedal edema. Gastrointestinal system: Abdomen is nondistended, soft and positive epigastric tenderness. No organomegaly or masses felt.    Central nervous system: Alert and oriented. No focal neurological deficits. Extremities: Symmetric 5 x 5 power. Skin: No rashes, lesions or ulcers Psychiatry: Judgement and insight appear normal. Mood & affect appropriate.   Data Reviewed: I have personally reviewed following labs and imaging studies  CBC:  Recent Labs Lab 04/18/16 1436 04/18/16 2046 04/19/16 0517  WBC 15.4* 12.1* 11.4*  HGB 13.8 13.4 13.3  HCT 42.8 41.4 41.8  MCV 83.8 84.5 86.5  PLT 383 310 [id number]   Basic Metabolic Panel:  Recent Labs Lab 04/18/16 1436 04/18/16 2046 04/19/16 0517  NA 139  --  139  K 3.5  --  4.5  CL 105  --  105  CO2 24  --  25  GLUCOSE 121*  --  149*  BUN 11  --  14  CREATININE 1.23 1.27* 1.31*  CALCIUM 9.8  --  8.1*   GFR: Estimated Creatinine Clearance: 92.5 mL/min (by C-G formula based on Cr of 1.31). Liver Function Tests:  Recent Labs Lab 04/18/16 1436 04/19/16 0517  AST 44* 40  ALT 61 40  ALKPHOS 46 38  BILITOT 0.5 1.2  PROT 7.6 6.3*  ALBUMIN 4.4 3.4*    Recent Labs Lab 04/18/16 1436 04/20/16 0514  LIPASE >3000* 371*   No results for input(s): AMMONIA in the last 168 hours. Coagulation Profile: No results for input(s): INR, PROTIME in the last 168 hours. Cardiac Enzymes:  Recent Labs Lab 04/18/16 1436  TROPONINI <0.03   BNP (  last 3 results) No results for input(s): PROBNP in the last 8760 hours. HbA1C: No results for input(s): HGBA1C in the last 72 hours. CBG:  Recent Labs Lab 04/19/16 0602 04/19/16 0752 04/19/16 1159 04/20/16 0014 04/20/16 0607  GLUCAP 150* 141* 152* 122* 131*   Lipid Profile: No results for input(s): CHOL, HDL, LDLCALC, TRIG, CHOLHDL, LDLDIRECT in the last 72 hours. Thyroid Function Tests: No results for input(s): TSH, T4TOTAL, FREET4, T3FREE, THYROIDAB in the last 72 hours. Anemia Panel: No results for input(s): VITAMINB12, FOLATE, FERRITIN, TIBC, IRON, RETICCTPCT in the last 72 hours. Sepsis Labs: No results for  input(s): PROCALCITON, LATICACIDVEN in the last 168 hours.  No results found for this or any previous visit (from the past 240 hour(s)).       Radiology Studies: Ct Abdomen Pelvis W Contrast  04/18/2016  CLINICAL DATA:  Epigastric pain for 2 hours, history of pancreatitis EXAM: CT ABDOMEN AND PELVIS WITH CONTRAST TECHNIQUE: Multidetector CT imaging of the abdomen and pelvis was performed using the standard protocol following bolus administration of intravenous contrast. CONTRAST:  100 cc ISOVUE-300 IOPAMIDOL (ISOVUE-300) INJECTION 61% COMPARISON:  07/22/2014 FINDINGS: Lower chest: Lung bases shows bilateral lower lobe posterior dependent atelectasis. Mild atelectasis noted left base anteriorly. Hepatobiliary: There is fatty infiltration of the liver. No calcified gallstones are noted within gallbladder. No focal hepatic mass. Pancreas: There is diffuse mild prominent pancreas. There is significant peripancreatic stranding and peripancreatic fluid. There is some fluid surrounding the distal duodenum. Free fluid is noted bilateral posterior upper mesentery. Some free fluid is noted in right mid mesentery and surrounding the central mesentery. There is no evidence of pancreatic phlegmon or abscess. No pancreatic pseudocyst. There is some perigastric and lateral free fluid in left upper abdomen. Spleen: No focal splenic mass. Adrenals/Urinary Tract: No adrenal gland mass. Kidneys are symmetrical in size and enhancement. There is a central cyst in midpole of the right kidney measures 2.2 cm. No hydronephrosis or hydroureter. Delayed renal images shows bilateral renal symmetrical excretion. Bilateral visualized proximal ureter is unremarkable. Moderate distended urinary bladder. Stomach/Bowel: No small bowel os obstruction. Moderate gastric distension with fluid probable mild gastric ileus. No thickened or dilated small bowel loops. Normal appendix. No pericecal inflammation. No distal colonic obstruction. The  colon is empty collapsed. Vascular/Lymphatic: Abdominal aorta, celiac trunk SMA portal vein and IVC are patent. No aortic aneurysm. No retroperitoneal adenopathy. Reproductive: Prostate gland and seminal vesicles are unremarkable. Other: There is no evidence of free abdominal air. Small amount of free fluid noted within posterior pelvis and right anterior pelvis. Musculoskeletal: No destructive bony lesions are noted. Sagittal images of the spine shows mild degenerative changes thoracolumbar spine. IMPRESSION: 1. There is diffuse mild prominent pancreas with significant peripancreatic stranding and peripancreatic fluid. Free fluid is noted surrounding the stomach proximal duodenum and in upper abdomen. No evidence of pancreatic phlegmon or pancreatic pseudocyst. 2. No calcified gallstones are noted within gallbladder. Fatty infiltration of the liver is noted. 3. No hydronephrosis or hydroureter. There is a right renal cyst measures 2.2 cm. Bilateral renal symmetrical excretion. 4. No small bowel or colonic obstruction. 5. There is moderate gastric distension with fluid probable gastric ileus. No evidence of gastric outlet obstruction. 6. No evidence of free abdominal air. 7. Moderate distended urinary bladder. Electronically Signed   By: Lahoma Crocker M.D.   On: 04/18/2016 19:43        Scheduled Meds: . enoxaparin (LOVENOX) injection  50 mg Subcutaneous Q24H  . fenofibrate  160  mg Oral Daily  . fluticasone  2 spray Each Nare Daily  . folic acid  1 mg Oral Daily  . LORazepam  0-4 mg Intravenous Q6H   Followed by  . LORazepam  0-4 mg Intravenous Q12H  . multivitamin with minerals  1 tablet Oral Daily  . pantoprazole (PROTONIX) IV  40 mg Intravenous Q24H  . thiamine  100 mg Oral Daily   Or  . thiamine  100 mg Intravenous Daily   Continuous Infusions: . dextrose 5 % and 0.45 % NaCl with KCl 20 mEq/L 125 mL/hr at 04/20/16 0329     LOS: 2 days    Time spent: > 35 minutes    Velvet Bathe,  MD [facility] Pager [phone]  If 7PM-7AM, please contact night-coverage [email or web address] Password [facility] 04/20/2016, 5:36 PM

## 2016-04-21 LAB — BASIC METABOLIC PANEL WITH GFR
Anion gap: 7 (ref 5–15)
BUN: 10 mg/dL (ref 6–20)
CO2: 25 mmol/L (ref 22–32)
Calcium: 7.5 mg/dL — ABNORMAL LOW (ref 8.9–10.3)
Chloride: 102 mmol/L (ref 101–111)
Creatinine, Ser: 1.14 mg/dL (ref 0.61–1.24)
GFR calc Af Amer: >60 mL/min
GFR calc non Af Amer: >60 mL/min
Glucose, Bld: 131 mg/dL — ABNORMAL HIGH (ref 65–99)
Potassium: 3.7 mmol/L (ref 3.5–5.1)
Sodium: 134 mmol/L — ABNORMAL LOW (ref 135–145)

## 2016-04-21 LAB — CBC
HCT: 35.1 % — ABNORMAL LOW (ref 39.0–52.0)
Hemoglobin: 11 g/dL — ABNORMAL LOW (ref 13.0–17.0)
MCH: 26.8 pg (ref 26.0–34.0)
MCHC: 31.3 g/dL (ref 30.0–36.0)
MCV: 85.4 fL (ref 78.0–100.0)
Platelets: 298 K/uL (ref 150–400)
RBC: 4.11 MIL/uL — ABNORMAL LOW (ref 4.22–5.81)
RDW: 14.6 % (ref 11.5–15.5)
WBC: 12.1 K/uL — ABNORMAL HIGH (ref 4.0–10.5)

## 2016-04-21 LAB — GLUCOSE, CAPILLARY
GLUCOSE-CAPILLARY: 114 mg/dL — AB (ref 65–99)
GLUCOSE-CAPILLARY: 116 mg/dL — AB (ref 65–99)
GLUCOSE-CAPILLARY: 139 mg/dL — AB (ref 65–99)
Glucose-Capillary: 113 mg/dL — ABNORMAL HIGH (ref 65–99)
Glucose-Capillary: 119 mg/dL — ABNORMAL HIGH (ref 65–99)

## 2016-04-21 LAB — LIPASE, BLOOD: Lipase: 128 U/L — ABNORMAL HIGH (ref 11–51)

## 2016-04-21 MED ORDER — PANTOPRAZOLE SODIUM 40 MG IV SOLR
40.0000 mg | Freq: Two times a day (BID) | INTRAVENOUS | Status: DC
  Administered 2016-04-21 – 2016-04-23 (×4): 40 mg via INTRAVENOUS
  Filled 2016-04-21 (×4): qty 40

## 2016-04-21 MED ORDER — GI COCKTAIL ~~LOC~~
30.0000 mL | Freq: Once | ORAL | Status: AC
  Administered 2016-04-21: 30 mL via ORAL
  Filled 2016-04-21: qty 30

## 2016-04-21 NOTE — Progress Notes (Signed)
PROGRESS NOTE    Christopher [name]  [id number] DOB: 1976-05-01 DOA: 04/18/2016 PCP: Annye Asa, MD   Brief Narrative:  40 y/o who presents with alcohol induced pancreatitis. Currently on bowel rest   Assessment & Plan:   Principal Problem:   Acute pancreatitis - continue bowel rest, will advance diet to clears -Supportive therapy patient complaining of continued pain with minimal relief on 1 mg of Dilaudid. As such will increase Dilaudid dose. Once patient taking orals will transition to oral pain medication regimen. - Continue PPI  Active Problems:   Hyperlipidemia - Stable no chest pain reported    Essential hypertension - Continue hydralazine as needed    Alcohol abuse - Continue Ativan as needed  DVT prophylaxis:  Lovenox Code Status: full Family Communication: d/c father Disposition Plan: Pending improvement in abdominal discomfort.   Consultants:   None   Procedures: None   Antimicrobials: None   Subjective: Patient has no new complaints. No acute issues overnight. He reports improvement in condition  Objective: Filed Vitals:   04/20/16 2201 04/21/16 0559 04/21/16 0838 04/21/16 1727  BP: 143/82 148/87 163/97 144/87  Pulse: 95 93 96 83  Temp: 99.1 F (37.3 C) 99.4 F (37.4 C) 98.8 F (37.1 C) 98.8 F (37.1 C)  TempSrc: Oral Oral Oral Oral  Resp: 16 16  17   Height:      Weight: 103.42 kg (228 lb)     SpO2: 96% 96% 96% 97%    Intake/Output Summary (Last 24 hours) at 04/21/16 1834 Last data filed at 04/21/16 1300  Gross per 24 hour  Intake  332.5 ml  Output   1025 ml  Net -692.5 ml   Filed Weights   04/18/16 1434 04/18/16 2117 04/20/16 2201  Weight: 100.699 kg (222 lb) 101.606 kg (224 lb) 103.42 kg (228 lb)    Examination:  General exam: Appears calm and comfortable  Respiratory system: Clear to auscultation. Respiratory effort normal.No wheezes Cardiovascular system: S1 & S2 heard, RRR. No JVD, murmurs, rubs, gallops or  clicks. No pedal edema. Gastrointestinal system: Abdomen is nondistended, soft and positive epigastric tenderness. No organomegaly or masses felt.  Central nervous system: Alert and oriented. No focal neurological deficits. Extremities: Symmetric 5 x 5 power. Skin: No rashes, lesions or ulcers Psychiatry: Judgement and insight appear normal. Mood & affect appropriate.   Data Reviewed: I have personally reviewed following labs and imaging studies  CBC:  Recent Labs Lab 04/18/16 1436 04/18/16 2046 04/19/16 0517 04/21/16 0419  WBC 15.4* 12.1* 11.4* 12.1*  HGB 13.8 13.4 13.3 11.0*  HCT 42.8 41.4 41.8 35.1*  MCV 83.8 84.5 86.5 85.4  PLT 383 310 315 [id number]   Basic Metabolic Panel:  Recent Labs Lab 04/18/16 1436 04/18/16 2046 04/19/16 0517 04/21/16 0419  NA 139  --  139 134*  K 3.5  --  4.5 3.7  CL 105  --  105 102  CO2 24  --  25 25  GLUCOSE 121*  --  149* 131*  BUN 11  --  14 10  CREATININE 1.23 1.27* 1.31* 1.14  CALCIUM 9.8  --  8.1* 7.5*   GFR: Estimated Creatinine Clearance: 107.1 mL/min (by C-G formula based on Cr of 1.14). Liver Function Tests:  Recent Labs Lab 04/18/16 1436 04/19/16 0517  AST 44* 40  ALT 61 40  ALKPHOS 46 38  BILITOT 0.5 1.2  PROT 7.6 6.3*  ALBUMIN 4.4 3.4*    Recent Labs Lab 04/18/16 1436 04/20/16 0514 04/21/16 1531  LIPASE >3000* 371* 128*   No results for input(s): AMMONIA in the last 168 hours. Coagulation Profile: No results for input(s): INR, PROTIME in the last 168 hours. Cardiac Enzymes:  Recent Labs Lab 04/18/16 1436  TROPONINI <0.03   BNP (last 3 results) No results for input(s): PROBNP in the last 8760 hours. HbA1C: No results for input(s): HGBA1C in the last 72 hours. CBG:  Recent Labs Lab 04/20/16 0607 04/21/16 0002 04/21/16 0558 04/21/16 1131 04/21/16 1820  GLUCAP 131* 119* 139* 116* 113*   Lipid Profile: No results for input(s): CHOL, HDL, LDLCALC, TRIG, CHOLHDL, LDLDIRECT in the last 72  hours. Thyroid Function Tests: No results for input(s): TSH, T4TOTAL, FREET4, T3FREE, THYROIDAB in the last 72 hours. Anemia Panel: No results for input(s): VITAMINB12, FOLATE, FERRITIN, TIBC, IRON, RETICCTPCT in the last 72 hours. Sepsis Labs: No results for input(s): PROCALCITON, LATICACIDVEN in the last 168 hours.  No results found for this or any previous visit (from the past 240 hour(s)).       Radiology Studies: No results found.      Scheduled Meds: . enoxaparin (LOVENOX) injection  50 mg Subcutaneous Q24H  . fenofibrate  160 mg Oral Daily  . fluticasone  2 spray Each Nare Daily  . folic acid  1 mg Oral Daily  . LORazepam  0-4 mg Intravenous Q12H  . multivitamin with minerals  1 tablet Oral Daily  . pantoprazole (PROTONIX) IV  40 mg Intravenous Q12H  . thiamine  100 mg Oral Daily   Or  . thiamine  100 mg Intravenous Daily   Continuous Infusions: . dextrose 5 % and 0.45 % NaCl with KCl 20 mEq/L 125 mL/hr at 04/21/16 0448     LOS: 3 days    Time spent: > 35 minutes    Velvet Bathe, MD [facility] Pager [phone]  If 7PM-7AM, please contact night-coverage [email or web address] Password [facility] 04/21/2016, 6:34 PM

## 2016-04-22 LAB — CBC
HCT: 33.3 % — ABNORMAL LOW (ref 39.0–52.0)
HEMOGLOBIN: 10.6 g/dL — AB (ref 13.0–17.0)
MCH: 27 pg (ref 26.0–34.0)
MCHC: 31.8 g/dL (ref 30.0–36.0)
MCV: 84.9 fL (ref 78.0–100.0)
PLATELETS: 334 10*3/uL (ref 150–400)
RBC: 3.92 MIL/uL — ABNORMAL LOW (ref 4.22–5.81)
RDW: 14.4 % (ref 11.5–15.5)
WBC: 13.2 10*3/uL — ABNORMAL HIGH (ref 4.0–10.5)

## 2016-04-22 LAB — GLUCOSE, CAPILLARY
GLUCOSE-CAPILLARY: 130 mg/dL — AB (ref 65–99)
GLUCOSE-CAPILLARY: 111 mg/dL — AB (ref 65–99)
Glucose-Capillary: 145 mg/dL — ABNORMAL HIGH (ref 65–99)
Glucose-Capillary: 128 mg/dL — ABNORMAL HIGH (ref 65–99)
Glucose-Capillary: 146 mg/dL — ABNORMAL HIGH (ref 65–99)

## 2016-04-22 MED ORDER — HYDROMORPHONE HCL 1 MG/ML IJ SOLN: 0.5000 mg | INTRAMUSCULAR | Status: DC | PRN

## 2016-04-22 MED ORDER — OXYCODONE HCL 5 MG PO TABS
5.0000 mg | ORAL_TABLET | ORAL | Status: DC | PRN
  Administered 2016-04-22 – 2016-04-23 (×6): 10 mg via ORAL
  Filled 2016-04-22 (×6): qty 2

## 2016-04-22 NOTE — Progress Notes (Signed)
PROGRESS NOTE    Christopher [name]  [id number] DOB: [birth year] DOA: 04/18/2016 PCP: Annye Asa, MD   Brief Narrative:  40 y/o who presents with alcohol induced pancreatitis. Currently on bowel rest   Assessment & Plan:   Principal Problem:   Acute pancreatitis - continue bowel rest, will advance diet to full liquid diet -Change pain medication regimen to oral regimen and see how patient does. He reports improvement in pain. - Continue PPI - Lipase continues to trend down.  Active Problems:   Hyperlipidemia - Stable no chest pain reported    Essential hypertension - Continue hydralazine as needed    Alcohol abuse - Continue Ativan as needed  DVT prophylaxis:  Lovenox Code Status: full Family Communication: d/c father Disposition Plan: Pending improvement in abdominal discomfort.   Consultants:   None   Procedures: None   Antimicrobials: None   Subjective: Patient has no new complaints. No acute issues overnight. He reports improvement in condition. Tried to go longer without dilaudid today.   Objective: Filed Vitals:   04/21/16 1727 04/21/16 2050 04/22/16 0509 04/22/16 0854  BP: 144/87 148/87 106/87 167/93  Pulse: 83 89 87 81  Temp: 98.8 F (37.1 C) 98.5 F (36.9 C) 100.3 F (37.9 C) 98.6 F (37 C)  TempSrc: Oral Oral Oral Oral  Resp: 17 18 18 18   Height:      Weight:      SpO2: 97% 95% 95% 96%    Intake/Output Summary (Last 24 hours) at 04/22/16 1703 Last data filed at 04/22/16 1303  Gross per 24 hour  Intake 1994.58 ml  Output    336 ml  Net 1658.58 ml   Filed Weights   04/18/16 1434 04/18/16 2117 04/20/16 2201  Weight: 100.699 kg (222 lb) 101.606 kg (224 lb) 103.42 kg (228 lb)    Examination:  General exam: Appears calm and comfortable  Respiratory system: Clear to auscultation. Respiratory effort normal.No wheezes Cardiovascular system: S1 & S2 heard, RRR. No JVD, murmurs, rubs, gallops or clicks. No pedal  edema. Gastrointestinal system: Abdomen is nondistended, soft and positive epigastric tenderness. No organomegaly or masses felt.  Central nervous system: Alert and oriented. No focal neurological deficits. Extremities: Symmetric 5 x 5 power. Skin: No rashes, lesions or ulcers Psychiatry: Judgement and insight appear normal. Mood & affect appropriate.   Data Reviewed: I have personally reviewed following labs and imaging studies  CBC:  Recent Labs Lab 04/18/16 1436 04/18/16 2046 04/19/16 0517 04/21/16 0419 04/22/16 0052  WBC 15.4* 12.1* 11.4* 12.1* 13.2*  HGB 13.8 13.4 13.3 11.0* 10.6*  HCT 42.8 41.4 41.8 35.1* 33.3*  MCV 83.8 84.5 86.5 85.4 84.9  PLT 383 310 315 298 [id number]   Basic Metabolic Panel:  Recent Labs Lab 04/18/16 1436 04/18/16 2046 04/19/16 0517 04/21/16 0419  NA 139  --  139 134*  K 3.5  --  4.5 3.7  CL 105  --  105 102  CO2 24  --  25 25  GLUCOSE 121*  --  149* 131*  BUN 11  --  14 10  CREATININE 1.23 1.27* 1.31* 1.14  CALCIUM 9.8  --  8.1* 7.5*   GFR: Estimated Creatinine Clearance: 107.1 mL/min (by C-G formula based on Cr of 1.14). Liver Function Tests:  Recent Labs Lab 04/18/16 1436 04/19/16 0517  AST 44* 40  ALT 61 40  ALKPHOS 46 38  BILITOT 0.5 1.2  PROT 7.6 6.3*  ALBUMIN 4.4 3.4*    Recent Labs Lab 04/18/16  1436 04/20/16 0514 04/21/16 1531  LIPASE >3000* 371* 128*   No results for input(s): AMMONIA in the last 168 hours. Coagulation Profile: No results for input(s): INR, PROTIME in the last 168 hours. Cardiac Enzymes:  Recent Labs Lab 04/18/16 1436  TROPONINI <0.03   BNP (last 3 results) No results for input(s): PROBNP in the last 8760 hours. HbA1C: No results for input(s): HGBA1C in the last 72 hours. CBG:  Recent Labs Lab 04/21/16 1820 04/21/16 2338 04/22/16 0516 04/22/16 0734 04/22/16 1153  GLUCAP 113* 114* 145* 128* 146*   Lipid Profile: No results for input(s): CHOL, HDL, LDLCALC, TRIG, CHOLHDL, LDLDIRECT  in the last 72 hours. Thyroid Function Tests: No results for input(s): TSH, T4TOTAL, FREET4, T3FREE, THYROIDAB in the last 72 hours. Anemia Panel: No results for input(s): VITAMINB12, FOLATE, FERRITIN, TIBC, IRON, RETICCTPCT in the last 72 hours. Sepsis Labs: No results for input(s): PROCALCITON, LATICACIDVEN in the last 168 hours.  No results found for this or any previous visit (from the past 240 hour(s)).       Radiology Studies: No results found.      Scheduled Meds: . enoxaparin (LOVENOX) injection  50 mg Subcutaneous Q24H  . fenofibrate  160 mg Oral Daily  . fluticasone  2 spray Each Nare Daily  . folic acid  1 mg Oral Daily  . LORazepam  0-4 mg Intravenous Q12H  . multivitamin with minerals  1 tablet Oral Daily  . pantoprazole (PROTONIX) IV  40 mg Intravenous Q12H  . thiamine  100 mg Oral Daily   Or  . thiamine  100 mg Intravenous Daily   Continuous Infusions: . dextrose 5 % and 0.45 % NaCl with KCl 20 mEq/L 125 mL/hr at 04/22/16 1547     LOS: 4 days    Time spent: > 35 minutes    Velvet Bathe, MD [facility] Pager [phone]  If 7PM-7AM, please contact night-coverage [email or web address] Password [email or web address] 04/22/2016, 5:03 PM

## 2016-04-23 ENCOUNTER — Encounter: Payer: Self-pay | Admitting: Family Medicine

## 2016-04-23 LAB — GLUCOSE, CAPILLARY
GLUCOSE-CAPILLARY: 104 mg/dL — AB (ref 65–99)
Glucose-Capillary: 101 mg/dL — ABNORMAL HIGH (ref 65–99)
Glucose-Capillary: 112 mg/dL — ABNORMAL HIGH (ref 65–99)
Glucose-Capillary: 135 mg/dL — ABNORMAL HIGH (ref 65–99)

## 2016-04-23 LAB — COMPREHENSIVE METABOLIC PANEL
ALBUMIN: 2.2 g/dL — AB (ref 3.5–5.0)
ALT: 58 U/L (ref 17–63)
AST: 42 U/L — AB (ref 15–41)
Alkaline Phosphatase: 51 U/L (ref 38–126)
Anion gap: 7 (ref 5–15)
BUN: 7 mg/dL (ref 6–20)
CHLORIDE: 101 mmol/L (ref 101–111)
CO2: 24 mmol/L (ref 22–32)
CREATININE: 0.99 mg/dL (ref 0.61–1.24)
Calcium: 7.7 mg/dL — ABNORMAL LOW (ref 8.9–10.3)
GFR calc Af Amer: >60 mL/min (ref >60)
GFR calc non Af Amer: >60 mL/min (ref >60)
Glucose, Bld: 115 mg/dL — ABNORMAL HIGH (ref 65–99)
POTASSIUM: 4 mmol/L (ref 3.5–5.1)
SODIUM: 132 mmol/L — AB (ref 135–145)
Total Bilirubin: 1.4 mg/dL — ABNORMAL HIGH (ref 0.3–1.2)
Total Protein: 5.3 g/dL — ABNORMAL LOW (ref 6.5–8.1)

## 2016-04-23 LAB — LIPASE, BLOOD: Lipase: 41 U/L (ref 11–51)

## 2016-04-23 MED ORDER — OXYCODONE HCL 5 MG PO TABS: 5.0000 mg | ORAL_TABLET | ORAL | Status: DC | PRN

## 2016-04-23 MED ORDER — ACETAMINOPHEN 325 MG PO TABS: 650.0000 mg | ORAL_TABLET | Freq: Four times a day (QID) | ORAL | Status: DC | PRN

## 2016-04-23 MED ORDER — ONDANSETRON HCL 4 MG PO TABS: 4.0000 mg | ORAL_TABLET | Freq: Four times a day (QID) | ORAL | Status: DC | PRN

## 2016-04-23 MED ORDER — PANTOPRAZOLE SODIUM 40 MG PO TBEC
40.0000 mg | DELAYED_RELEASE_TABLET | Freq: Two times a day (BID) | ORAL | Status: DC
Start: 2016-04-23 — End: 2016-04-23

## 2016-04-23 NOTE — Discharge Summary (Signed)
Physician Discharge Summary  Christopher Christopher [name] [id number] DOB: [birth year] DOA: 04/18/2016  PCP: Annye Asa, MD  Admit date: 04/18/2016 Discharge date: 04/23/2016  Time spent: > 35 minutes  Recommendations for Outpatient Follow-up:  1. Monitor sodium levels 2. Encourage alcohol cessation  Discharge Diagnoses:  Principal Problem:   Acute pancreatitis Active Problems:   Hyperlipidemia   Essential hypertension   Alcohol abuse   Pancreatitis   Acute alcoholic pancreatitis   Discharge Condition: stable  Diet recommendation: low fat diet.  Filed Weights   04/18/16 2117 04/20/16 2201 04/23/16 0047  Weight: 101.606 kg (224 lb) 103.42 kg (228 lb) 108.274 kg (238 lb 11.2 oz)    History of present illness:  40 Christopher [name] who presented to the hospital due to alcohol related acute pancreatitis  Hospital Course:  Acute pancreatitis - encouraged alcohol cessation - recommended low fat diet - lipase wnl on last check  Fever - no new complaints reported. May be measurement error. Patient has been recommended to monitor and if any further fevers f/u with his primary care physician.  Procedures:  None  Consultations:  none  Discharge Exam: Filed Vitals:   04/22/16 2001 04/23/16 0533  BP: 151/86 148/86  Pulse: 84 92  Temp: 99.4 F (37.4 C) 100.7 F (38.2 C)  Resp: 19 19    General: Pt in nad, alert and awake Cardiovascular: rrr, no mrg Respiratory: cta bl, no wheezes  Discharge Instructions   Discharge Instructions    Call MD for:  temperature >100.4    Complete by:  As directed      Diet - low sodium heart healthy    Complete by:  As directed      Discharge instructions    Complete by:  As directed   Please eat a low fat diet.     Increase activity slowly    Complete by:  As directed           Current Discharge Medication List    START taking these medications   Details  acetaminophen (TYLENOL) 325 MG tablet Take 2 tablets (650 mg total) by mouth  every 6 (six) hours as needed for mild pain (or Fever >/= 101).    ondansetron (ZOFRAN) 4 MG tablet Take 1 tablet (4 mg total) by mouth every 6 (six) hours as needed for nausea. Qty: 20 tablet, Refills: 0    oxyCODONE (OXY IR/ROXICODONE) 5 MG immediate release tablet Take 1-2 tablets (5-10 mg total) by mouth every 4 (four) hours as needed for moderate pain. Qty: 30 tablet, Refills: 0      CONTINUE these medications which have NOT CHANGED   Details  BENICAR 40 MG tablet Take 1 tablet by mouth  daily Qty: 90 tablet, Refills: 1    clidinium-chlordiazePOXIDE (LIBRAX) 5-2.5 MG capsule Take 1 capsule by mouth  daily Qty: 90 capsule, Refills: 1    fenofibrate (TRICOR) 145 MG tablet Take 1 tablet by mouth  daily Qty: 90 tablet, Refills: 1    fluticasone (FLONASE) 50 MCG/ACT nasal spray Place 2 sprays into both nostrils daily. Qty: 16 g, Refills: 6    pantoprazole (PROTONIX) 40 MG tablet Take 1 tablet (40 mg total) by mouth daily. Qty: 90 tablet, Refills: 1    temazepam (RESTORIL) 15 MG capsule Take 1 capsule (15 mg total) by mouth at bedtime as needed for sleep. Qty: 90 capsule, Refills: 1   Associated Diagnoses: Insomnia      STOP taking these medications     ibuprofen (ADVIL,MOTRIN)  200 MG tablet      naproxen sodium (ANAPROX) 220 MG tablet        No Known Allergies    The results of significant diagnostics from this hospitalization (including imaging, microbiology, ancillary and laboratory) are listed below for reference.    Significant Diagnostic Studies: Ct Abdomen Pelvis W Contrast  04/18/2016  CLINICAL DATA:  Epigastric pain for 2 hours, history of pancreatitis EXAM: CT ABDOMEN AND PELVIS WITH CONTRAST TECHNIQUE: Multidetector CT imaging of the abdomen and pelvis was performed using the standard protocol following bolus administration of intravenous contrast. CONTRAST:  100 cc ISOVUE-300 IOPAMIDOL (ISOVUE-300) INJECTION 61% COMPARISON:  07/22/2014 FINDINGS: Lower chest:  Lung bases shows bilateral lower lobe posterior dependent atelectasis. Mild atelectasis noted left base anteriorly. Hepatobiliary: There is fatty infiltration of the liver. No calcified gallstones are noted within gallbladder. No focal hepatic mass. Pancreas: There is diffuse mild prominent pancreas. There is significant peripancreatic stranding and peripancreatic fluid. There is some fluid surrounding the distal duodenum. Free fluid is noted bilateral posterior upper mesentery. Some free fluid is noted in right mid mesentery and surrounding the central mesentery. There is no evidence of pancreatic phlegmon or abscess. No pancreatic pseudocyst. There is some perigastric and lateral free fluid in left upper abdomen. Spleen: No focal splenic mass. Adrenals/Urinary Tract: No adrenal gland mass. Kidneys are symmetrical in size and enhancement. There is a central cyst in midpole of the right kidney measures 2.2 cm. No hydronephrosis or hydroureter. Delayed renal images shows bilateral renal symmetrical excretion. Bilateral visualized proximal ureter is unremarkable. Moderate distended urinary bladder. Stomach/Bowel: No small bowel os obstruction. Moderate gastric distension with fluid probable mild gastric ileus. No thickened or dilated small bowel loops. Normal appendix. No pericecal inflammation. No distal colonic obstruction. The colon is empty collapsed. Vascular/Lymphatic: Abdominal aorta, celiac trunk SMA portal vein and IVC are patent. No aortic aneurysm. No retroperitoneal adenopathy. Reproductive: Prostate gland and seminal vesicles are unremarkable. Other: There is no evidence of free abdominal air. Small amount of free fluid noted within posterior pelvis and right anterior pelvis. Musculoskeletal: No destructive bony lesions are noted. Sagittal images of the spine shows mild degenerative changes thoracolumbar spine. IMPRESSION: 1. There is diffuse mild prominent pancreas with significant peripancreatic  stranding and peripancreatic fluid. Free fluid is noted surrounding the stomach proximal duodenum and in upper abdomen. No evidence of pancreatic phlegmon or pancreatic pseudocyst. 2. No calcified gallstones are noted within gallbladder. Fatty infiltration of the liver is noted. 3. No hydronephrosis or hydroureter. There is a right renal cyst measures 2.2 cm. Bilateral renal symmetrical excretion. 4. No small bowel or colonic obstruction. 5. There is moderate gastric distension with fluid probable gastric ileus. No evidence of gastric outlet obstruction. 6. No evidence of free abdominal air. 7. Moderate distended urinary bladder. Electronically Signed   By: Lahoma Crocker M.D.   On: 04/18/2016 19:43   Dg Abd Acute W/chest  04/18/2016  CLINICAL DATA:  Diarrhea starting last night, abdominal pain and emesis starting today. EXAM: DG ABDOMEN ACUTE W/ 1V CHEST COMPARISON:  None. FINDINGS: Low lung volumes are present, causing crowding of the pulmonary vasculature. No free intraperitoneal gas beneath the hemidiaphragms. Small amount of gas and stool in the ascending colon. Most of the abdomen is gasless. IMPRESSION: 1. The small bowel is gasless there is only a small amount of gas and stool in the colon. No abnormality is seen but the absence of gas within the bowel reduces negative predictive value. 2. Low lung  volumes. Electronically Signed   By: Van Clines M.D.   On: 04/18/2016 16:15    Microbiology: No results found for this or any previous visit (from the past 240 hour(s)).   Labs: Basic Metabolic Panel:  Recent Labs Lab 04/18/16 1436 04/18/16 2046 04/19/16 0517 04/21/16 0419 04/23/16 0516  NA 139  --  139 134* 132*  K 3.5  --  4.5 3.7 4.0  CL 105  --  105 102 101  CO2 24  --  25 25 24   GLUCOSE 121*  --  149* 131* 115*  BUN 11  --  14 10 7   CREATININE 1.23 1.27* 1.31* 1.14 0.99  CALCIUM 9.8  --  8.1* 7.5* 7.7*   Liver Function Tests:  Recent Labs Lab 04/18/16 1436 04/19/16 0517  04/23/16 0516  AST 44* 40 42*  ALT 61 40 58  ALKPHOS 46 38 51  BILITOT 0.5 1.2 1.4*  PROT 7.6 6.3* 5.3*  ALBUMIN 4.4 3.4* 2.2*    Recent Labs Lab 04/18/16 1436 04/20/16 0514 04/21/16 1531 04/23/16 0516  LIPASE >3000* 371* 128* 41   No results for input(s): AMMONIA in the last 168 hours. CBC:  Recent Labs Lab 04/18/16 1436 04/18/16 2046 04/19/16 0517 04/21/16 0419 04/22/16 0052  WBC 15.4* 12.1* 11.4* 12.1* 13.2*  HGB 13.8 13.4 13.3 11.0* 10.6*  HCT 42.8 41.4 41.8 35.1* 33.3*  MCV 83.8 84.5 86.5 85.4 84.9  PLT 383 310 315 298 334   Cardiac Enzymes:  Recent Labs Lab 04/18/16 1436  TROPONINI <0.03   BNP: BNP (last 3 results) No results for input(s): BNP in the last 8760 hours.  ProBNP (last 3 results) No results for input(s): PROBNP in the last 8760 hours.  CBG:  Recent Labs Lab 04/22/16 1648 04/23/16 0048 04/23/16 0533 04/23/16 0831 04/23/16 1230  GLUCAP 111* 101* 112* 104* 135*    Signed:  Velvet Bathe MD.  [facility] 04/23/2016, 1:54 PM

## 2016-04-24 NOTE — Progress Notes (Signed)
Patient called on 04-24-2016 @ 9:47.  Returned call @ 10:25.  Patient experiencing sweats that he is attributing to the new Oxycodone RX.  Suggested that patient stop taking Oxycodone and instead take Tylenol listed in his AVS.  Educated about 4000mg  max on Tylenol and suggested to stay well under this max.  Directed to call primary physician if any further problems.  Jillyn Ledger, MBA, BSN, RN

## 2016-04-25 ENCOUNTER — Telehealth: Payer: Self-pay | Admitting: *Deleted

## 2016-04-25 NOTE — Telephone Encounter (Signed)
Unable to reach patient at time of TCM call. Left message for patient to return call when available.   

## 2016-04-26 NOTE — Telephone Encounter (Signed)
Transition Care Management Follow-up Telephone Call  Christopher [name] [id number] DOB: [birth year] DOA: 04/18/2016  PCP: Annye Asa, MD  Admit date: 04/18/2016 Discharge date: 04/23/2016  Recommendations for Outpatient Follow-up:  1. Monitor sodium levels 2. Encourage alcohol cessation  Discharge Diagnoses:  Principal Problem:  Acute pancreatitis Active Problems:  Hyperlipidemia  Essential hypertension  Alcohol abuse  Pancreatitis  Acute alcoholic pancreatitis   Discharge Condition: stable  Diet recommendation: low fat diet   How have you been since you were released from the hospital? "Uhhh ups and downs. The medicine they gave me didn't really agree with me, so I went straight to the Tylenol and that's pretty much worked." (medication referenced is oxycodone)   Do you understand why you were in the hospital? yes   Do you understand the discharge instructions? yes   Where were you discharged to? Home   Items Reviewed:  Medications reviewed: yes  Allergies reviewed: yes  Dietary changes reviewed: yes, low fat diet  Referrals reviewed: no, none made   Functional Questionnaire:   Activities of Daily Living (ADLs):   He states they are independent in the following: ambulation, bathing and hygiene, feeding, continence, grooming, toileting and dressing States they require assistance with the following: none   Any transportation issues/concerns?: no   Any patient concerns? Yes, pt is having diarrhea and would like to consider medication to help alleviate.   Confirmed importance and date/time of follow-up visits scheduled yes   Provider Appointment booked with Dr. Annye Asa 04/28/16 @ 11am   Confirmed with patient if condition begins to worsen call PCP or go to the ER.  Patient was given the office number and encouraged to call back with question or concerns.  : yes

## 2016-04-28 ENCOUNTER — Encounter: Payer: Self-pay | Admitting: Family Medicine

## 2016-04-28 ENCOUNTER — Other Ambulatory Visit: Payer: Self-pay | Admitting: Family Medicine

## 2016-04-28 ENCOUNTER — Ambulatory Visit (INDEPENDENT_AMBULATORY_CARE_PROVIDER_SITE_OTHER): Payer: Commercial Managed Care - HMO | Admitting: Family Medicine

## 2016-04-28 VITALS — BP 136/86 | HR 82 | Temp 98.0°F | Resp 17 | Ht 72.0 in | Wt 211.0 lb

## 2016-04-28 DIAGNOSIS — K860 Alcohol-induced chronic pancreatitis: Secondary | ICD-10-CM | POA: Diagnosis not present

## 2016-04-28 DIAGNOSIS — F1010 Alcohol abuse, uncomplicated: Secondary | ICD-10-CM

## 2016-04-28 DIAGNOSIS — D72829 Elevated white blood cell count, unspecified: Secondary | ICD-10-CM

## 2016-04-28 LAB — CBC WITH DIFFERENTIAL/PLATELET
BASOS ABS: 0.1 10*3/uL (ref 0.0–0.1)
Basophils Relative: 0.5 % (ref 0.0–3.0)
EOS ABS: 0.3 10*3/uL (ref 0.0–0.7)
Eosinophils Relative: 2.1 % (ref 0.0–5.0)
HEMATOCRIT: 35.2 % — AB (ref 39.0–52.0)
HEMOGLOBIN: 11.5 g/dL — AB (ref 13.0–17.0)
LYMPHS PCT: 11.3 % — AB (ref 12.0–46.0)
Lymphs Abs: 1.4 10*3/uL (ref 0.7–4.0)
MCHC: 32.6 g/dL (ref 30.0–36.0)
MCV: 84.5 fl (ref 78.0–100.0)
MONOS PCT: 4.9 % (ref 3.0–12.0)
Monocytes Absolute: 0.6 10*3/uL (ref 0.1–1.0)
Neutro Abs: 10 10*3/uL — ABNORMAL HIGH (ref 1.4–7.7)
Neutrophils Relative %: 81.2 % — ABNORMAL HIGH (ref 43.0–77.0)
Platelets: 600 10*3/uL — ABNORMAL HIGH (ref 150.0–400.0)
RBC: 4.16 Mil/uL — AB (ref 4.22–5.81)
RDW: 16 % — ABNORMAL HIGH (ref 11.5–15.5)
WBC: 12.3 10*3/uL — AB (ref 4.0–10.5)

## 2016-04-28 LAB — BASIC METABOLIC PANEL
BUN: 13 mg/dL (ref 6–23)
CALCIUM: 9.1 mg/dL (ref 8.4–10.5)
CO2: 23 meq/L (ref 19–32)
CREATININE: 0.97 mg/dL (ref 0.40–1.50)
Chloride: 104 mEq/L (ref 96–112)
GFR: 90.83 mL/min (ref >60.00)
GLUCOSE: 93 mg/dL (ref 70–99)
Potassium: 4.8 mEq/L (ref 3.5–5.1)
SODIUM: 139 meq/L (ref 135–145)

## 2016-04-28 LAB — HEPATIC FUNCTION PANEL
ALT: 64 U/L — AB (ref 0–53)
AST: 27 U/L (ref 0–37)
Albumin: 3.6 g/dL (ref 3.5–5.2)
Alkaline Phosphatase: 70 U/L (ref 39–117)
Bilirubin, Direct: 0.2 mg/dL (ref 0.0–0.3)
TOTAL PROTEIN: 6.9 g/dL (ref 6.0–8.3)
Total Bilirubin: 0.8 mg/dL (ref 0.2–1.2)

## 2016-04-28 NOTE — Patient Instructions (Signed)
Follow up as needed/scheduled We'll notify you of your lab results and make any changes if needed Drink plenty of water Avoid alcohol!!! Limit your amount of fatty foods- fried foods, dairy, sausage, bacon If the cough worsens or the fever continues, let me know and we'll get an xray Call with any questions or concerns Hang in there!!!

## 2016-04-28 NOTE — Progress Notes (Signed)
Pre visit review using our clinic review tool, if applicable. No additional management support is needed unless otherwise documented below in the visit note. 

## 2016-04-28 NOTE — Progress Notes (Signed)
   Subjective:    Patient ID: Christopher [name], male    DOB: 02-01-1976, 40 y.o.   MRN: [id number]  [facility] f/u- pt was admitted [id number] w/ alcoholic pancreatitis.  On admission pt's Lipase >3000 after a weekend of binge drinking.  Pt has decided to stop drinking.  Is attempting to limit fried food and make better food choices.  Pt's Lipase on d/c was 41.  Continues to have epigastric pain/discomfort particularly w/ eating.  Having loose stools w/ eating.  Pt's Na was slightly decreased while hospitalized.  Returned to work for 2 half days.  No N/V.  Pt has not been able to take pain meds due to side effects.  Pt reports he has had intermittent fevers since returning home- Tm 101.6, the other night was 100.4.  + coughing.     Review of Systems For ROS see HPI     Objective:   Physical Exam  Constitutional: He is oriented to person, place, and time. He appears well-developed and well-nourished. No distress.  HENT:  Head: Normocephalic and atraumatic.  Eyes: Conjunctivae and EOM are normal. Pupils are equal, round, and reactive to light.  Neck: Normal range of motion. Neck supple. No thyromegaly present.  Cardiovascular: Normal rate, regular rhythm, normal heart sounds and intact distal pulses.   No murmur heard. Pulmonary/Chest: Effort normal and breath sounds normal. No respiratory distress.  Abdominal: Soft. Bowel sounds are normal. He exhibits no distension. There is tenderness. There is no rebound and no guarding.  Musculoskeletal: He exhibits no edema.  Lymphadenopathy:    He has no cervical adenopathy.  Neurological: He is alert and oriented to person, place, and time. No cranial nerve deficit.  Skin: Skin is warm and dry.  Psychiatric: He has a normal mood and affect. His behavior is normal.  Vitals reviewed.         Assessment & Plan:

## 2016-05-04 ENCOUNTER — Other Ambulatory Visit: Payer: Self-pay | Admitting: Family Medicine

## 2016-05-04 ENCOUNTER — Other Ambulatory Visit (INDEPENDENT_AMBULATORY_CARE_PROVIDER_SITE_OTHER): Payer: Commercial Managed Care - HMO

## 2016-05-04 DIAGNOSIS — R7989 Other specified abnormal findings of blood chemistry: Secondary | ICD-10-CM

## 2016-05-04 DIAGNOSIS — D72829 Elevated white blood cell count, unspecified: Secondary | ICD-10-CM

## 2016-05-04 LAB — CBC WITH DIFFERENTIAL/PLATELET
BASOS PCT: 1.2 % (ref 0.0–3.0)
Basophils Absolute: 0.1 10*3/uL (ref 0.0–0.1)
EOS ABS: 0.2 10*3/uL (ref 0.0–0.7)
EOS PCT: 3.9 % (ref 0.0–5.0)
HEMATOCRIT: 35 % — AB (ref 39.0–52.0)
HEMOGLOBIN: 11.6 g/dL — AB (ref 13.0–17.0)
LYMPHS PCT: 26.8 % (ref 12.0–46.0)
Lymphs Abs: 1.5 10*3/uL (ref 0.7–4.0)
MCHC: 33 g/dL (ref 30.0–36.0)
MCV: 85.4 fl (ref 78.0–100.0)
Monocytes Absolute: 0.4 10*3/uL (ref 0.1–1.0)
Monocytes Relative: 7.7 % (ref 3.0–12.0)
Neutro Abs: 3.4 10*3/uL (ref 1.4–7.7)
Neutrophils Relative %: 60.4 % (ref 43.0–77.0)
Platelets: 704 10*3/uL — ABNORMAL HIGH (ref 150.0–400.0)
RBC: 4.09 Mil/uL — AB (ref 4.22–5.81)
RDW: 15.3 % (ref 11.5–15.5)
WBC: 5.6 10*3/uL (ref 4.0–10.5)

## 2016-05-04 NOTE — Telephone Encounter (Signed)
Medication filled to pharmacy as requested.   

## 2016-05-08 ENCOUNTER — Encounter: Payer: Self-pay | Admitting: Family Medicine

## 2016-05-08 ENCOUNTER — Telehealth: Payer: Self-pay | Admitting: General Practice

## 2016-05-08 NOTE — Telephone Encounter (Signed)
Spoke with pt, he advised that the pain is worse on the right side about a 5-6/10, this pain is located under the ribs and hurts with generally any movement.   Left sided pain is located about the waist line (between hip and ribs) and hurts front to back. Pt states that his cough is generally subsided but is wondering if he needs to be concerned with the pain and should he be seen? Or should he wait until the labs are drawn and see if that gives you any ideas as to what is causing the pain?

## 2016-05-08 NOTE — Telephone Encounter (Signed)
Called pt and LMOVM to find out more about this abd pain he is experiencing.

## 2016-05-08 NOTE — Telephone Encounter (Signed)
I would be happy to see him b/c the pain should be improving- not worsening.

## 2016-05-08 NOTE — Telephone Encounter (Signed)
Can you please cal land schedule pt for sometime this week.

## 2016-05-09 NOTE — Telephone Encounter (Signed)
Pt states that he is unable to make appt right now and will call back in an hour or so.

## 2016-05-10 NOTE — Telephone Encounter (Signed)
Pt called back and scheduled an appt

## 2016-05-11 ENCOUNTER — Encounter: Payer: Self-pay | Admitting: Family Medicine

## 2016-05-11 ENCOUNTER — Ambulatory Visit (INDEPENDENT_AMBULATORY_CARE_PROVIDER_SITE_OTHER): Payer: Commercial Managed Care - HMO | Admitting: Family Medicine

## 2016-05-11 VITALS — BP 122/82 | HR 68 | Temp 98.0°F | Resp 16 | Ht 72.0 in | Wt 205.2 lb

## 2016-05-11 DIAGNOSIS — R7402 Elevation of levels of lactic acid dehydrogenase (LDH): Secondary | ICD-10-CM

## 2016-05-11 DIAGNOSIS — R740 Nonspecific elevation of levels of transaminase and lactic acid dehydrogenase [LDH]: Secondary | ICD-10-CM | POA: Diagnosis not present

## 2016-05-11 DIAGNOSIS — R109 Unspecified abdominal pain: Secondary | ICD-10-CM

## 2016-05-11 LAB — POCT URINALYSIS DIPSTICK
Bilirubin, UA: NEGATIVE
Glucose, UA: NEGATIVE
Ketones, UA: NEGATIVE
Leukocytes, UA: NEGATIVE
Nitrite, UA: NEGATIVE
PH UA: 5
PROTEIN UA: NEGATIVE
RBC UA: NEGATIVE
UROBILINOGEN UA: 0.2

## 2016-05-11 LAB — CBC WITH DIFFERENTIAL/PLATELET
BASOS PCT: 1 % (ref 0.0–3.0)
Basophils Absolute: 0 10*3/uL (ref 0.0–0.1)
EOS ABS: 0.2 10*3/uL (ref 0.0–0.7)
Eosinophils Relative: 4.8 % (ref 0.0–5.0)
HCT: 35.3 % — ABNORMAL LOW (ref 39.0–52.0)
HEMOGLOBIN: 11.6 g/dL — AB (ref 13.0–17.0)
Lymphocytes Relative: 29 % (ref 12.0–46.0)
Lymphs Abs: 1.1 10*3/uL (ref 0.7–4.0)
MCHC: 33 g/dL (ref 30.0–36.0)
MCV: 85.2 fl (ref 78.0–100.0)
MONO ABS: 0.5 10*3/uL (ref 0.1–1.0)
Monocytes Relative: 12.2 % — ABNORMAL HIGH (ref 3.0–12.0)
NEUTROS PCT: 53 % (ref 43.0–77.0)
Neutro Abs: 2 10*3/uL (ref 1.4–7.7)
PLATELETS: 390 10*3/uL (ref 150.0–400.0)
RBC: 4.14 Mil/uL — ABNORMAL LOW (ref 4.22–5.81)
RDW: 15.6 % — AB (ref 11.5–15.5)
WBC: 3.7 10*3/uL — AB (ref 4.0–10.5)

## 2016-05-11 LAB — HEPATIC FUNCTION PANEL
ALT: 26 U/L (ref 0–53)
AST: 20 U/L (ref 0–37)
Albumin: 4.1 g/dL (ref 3.5–5.2)
Alkaline Phosphatase: 42 U/L (ref 39–117)
BILIRUBIN DIRECT: 0.1 mg/dL (ref 0.0–0.3)
BILIRUBIN TOTAL: 0.5 mg/dL (ref 0.2–1.2)
Total Protein: 6.9 g/dL (ref 6.0–8.3)

## 2016-05-11 MED ORDER — CYCLOBENZAPRINE HCL 10 MG PO TABS: 10.0000 mg | ORAL_TABLET | Freq: Three times a day (TID) | ORAL | Status: DC | PRN

## 2016-05-11 NOTE — Patient Instructions (Signed)
Follow up as needed We'll notify you of your lab results and make any changes if needed Start the flexeril as needed for pain/spasm Ibuprofen as needed Drink plenty of water Call with any questions or concerns- particularly if pain is not improving Hang in there!!!

## 2016-05-11 NOTE — Assessment & Plan Note (Signed)
Repeat today to ensure levels have normalized after recent pancreatitis.

## 2016-05-11 NOTE — Progress Notes (Signed)
Pre visit review using our clinic review tool, if applicable. No additional management support is needed unless otherwise documented below in the visit note. 

## 2016-05-11 NOTE — Progress Notes (Signed)
   Subjective:    Patient ID: Christopher [name], male    DOB: Jun 14, 1976, 40 y.o.   MRN: [id number]  HPI L sided pain- pt is now having L flank pain that radiates up into back.  No longer having epigastric or R sided pain.  Pain is worse w/ deep breaths, coughing/sneezing, and certain movements.  No burning w/ urination, no blood in urine.  No fevers.  No N/V.  No hx of kidney stones.  Pt has been shoveling mulch.     Review of Systems For ROS see HPI     Objective:   Physical Exam  Constitutional: He is oriented to person, place, and time. He appears well-developed and well-nourished. No distress.  HENT:  Head: Normocephalic and atraumatic.  Pulmonary/Chest: Effort normal and breath sounds normal. No respiratory distress. He has no wheezes. He has no rales. He exhibits no tenderness.  Abdominal: Soft. Bowel sounds are normal. He exhibits no distension and no mass (no splenomegaly). There is no tenderness (no CVA tenderness, no TTP over L flank). There is no rebound and no guarding.  Musculoskeletal: He exhibits no tenderness (over L back/flank).  Neurological: He is alert and oriented to person, place, and time.  Skin: Skin is warm and dry. No rash noted. No erythema.  Psychiatric: He has a normal mood and affect. His behavior is normal. Thought content normal.  Vitals reviewed.         Assessment & Plan:

## 2016-05-11 NOTE — Assessment & Plan Note (Signed)
New.  No evidence of blood in UA, no urinary sxs.  No splenomegaly.  No TTP.  Pt's pain w/ motion, deep breath is consistent w/ musculoskeletal etiology from spreading mulch.  No tachycardia or hypoxia to suggest PE.  Start muscle relaxer, NSAIDs prn.  Reviewed supportive care and red flags that should prompt return.  Pt expressed understanding and is in agreement w/ plan.

## 2016-05-13 NOTE — Assessment & Plan Note (Signed)
Recurrent problem for pt.  Recent d/c summary and labs reviewed.  Stressed need for him to stop drinking.  Due to fever at home will recheck CBC.  Pt's pain is improving.  Repeat LFTs, BMP.  Will continue to follow closely.

## 2016-05-13 NOTE — Assessment & Plan Note (Signed)
Ongoing issue for pt.  Stressed the need for him to stop drinking given his recurrent bouts of pancreatitis and excessively elevated Lipase.  Pt is finally in agreement and states 'i'm done'.  Will continue to follow.

## 2016-10-13 ENCOUNTER — Encounter: Payer: Commercial Managed Care - HMO | Admitting: Family Medicine

## 2016-10-27 HISTORY — PX: SHOULDER SURGERY: SHX246

## 2016-11-29 DIAGNOSIS — M25512 Pain in left shoulder: Secondary | ICD-10-CM | POA: Diagnosis not present

## 2016-11-30 ENCOUNTER — Telehealth: Payer: Self-pay | Admitting: Family Medicine

## 2016-11-30 ENCOUNTER — Ambulatory Visit (INDEPENDENT_AMBULATORY_CARE_PROVIDER_SITE_OTHER): Payer: Commercial Managed Care - HMO | Admitting: Family Medicine

## 2016-11-30 ENCOUNTER — Encounter: Payer: Self-pay | Admitting: Family Medicine

## 2016-11-30 VITALS — BP 121/82 | HR 86 | Temp 98.0°F | Resp 16 | Ht 72.0 in | Wt 216.2 lb

## 2016-11-30 DIAGNOSIS — Z0000 Encounter for general adult medical examination without abnormal findings: Secondary | ICD-10-CM | POA: Diagnosis not present

## 2016-11-30 DIAGNOSIS — H04123 Dry eye syndrome of bilateral lacrimal glands: Secondary | ICD-10-CM | POA: Diagnosis not present

## 2016-11-30 LAB — LIPID PANEL
Cholesterol: 175 mg/dL (ref 0–200)
HDL: 39.5 mg/dL
NonHDL: 135.79
Total CHOL/HDL Ratio: 4
Triglycerides: 233 mg/dL — ABNORMAL HIGH (ref 0.0–149.0)
VLDL: 46.6 mg/dL — ABNORMAL HIGH (ref 0.0–40.0)

## 2016-11-30 LAB — HEPATIC FUNCTION PANEL
ALT: 60 U/L — AB (ref 0–53)
AST: 35 U/L (ref 0–37)
Albumin: 4.4 g/dL (ref 3.5–5.2)
Alkaline Phosphatase: 37 U/L — ABNORMAL LOW (ref 39–117)
BILIRUBIN DIRECT: 0.2 mg/dL (ref 0.0–0.3)
TOTAL PROTEIN: 7.2 g/dL (ref 6.0–8.3)
Total Bilirubin: 0.6 mg/dL (ref 0.2–1.2)

## 2016-11-30 LAB — BASIC METABOLIC PANEL
BUN: 13 mg/dL (ref 6–23)
CALCIUM: 9.4 mg/dL (ref 8.4–10.5)
CHLORIDE: 104 meq/L (ref 96–112)
CO2: 32 meq/L (ref 19–32)
CREATININE: 1.21 mg/dL (ref 0.40–1.50)
GFR: 70.17 mL/min (ref >60.00)
GLUCOSE: 103 mg/dL — AB (ref 70–99)
Potassium: 4.4 mEq/L (ref 3.5–5.1)
Sodium: 141 mEq/L (ref 135–145)

## 2016-11-30 LAB — CBC WITH DIFFERENTIAL/PLATELET
BASOS ABS: 0 10*3/uL (ref 0.0–0.1)
Basophils Relative: 0.7 % (ref 0.0–3.0)
EOS ABS: 0.2 10*3/uL (ref 0.0–0.7)
Eosinophils Relative: 3.6 % (ref 0.0–5.0)
HEMATOCRIT: 40.9 % (ref 39.0–52.0)
Hemoglobin: 13.9 g/dL (ref 13.0–17.0)
LYMPHS ABS: 1.1 10*3/uL (ref 0.7–4.0)
LYMPHS PCT: 19.8 % (ref 12.0–46.0)
MCHC: 34.1 g/dL (ref 30.0–36.0)
MCV: 86.9 fl (ref 78.0–100.0)
MONO ABS: 0.6 10*3/uL (ref 0.1–1.0)
Monocytes Relative: 11.3 % (ref 3.0–12.0)
NEUTROS ABS: 3.4 10*3/uL (ref 1.4–7.7)
NEUTROS PCT: 64.6 % (ref 43.0–77.0)
Platelets: 315 10*3/uL (ref 150.0–400.0)
RBC: 4.71 Mil/uL (ref 4.22–5.81)
RDW: 12.8 % (ref 11.5–15.5)
WBC: 5.3 10*3/uL (ref 4.0–10.5)

## 2016-11-30 LAB — TSH: TSH: 1.62 u[IU]/mL (ref 0.35–4.50)

## 2016-11-30 LAB — LDL CHOLESTEROL, DIRECT: LDL DIRECT: 110 mg/dL

## 2016-11-30 NOTE — Patient Instructions (Signed)
Follow up in 6 months to recheck BP and cholesterol We'll notify you of your lab results and make any changes if needed Keep up the good work on healthy diet and regular exercise- you look great! Call with any questions or concerns Happy New Year!!! 

## 2016-11-30 NOTE — Telephone Encounter (Signed)
Super nice guy.  [place] w/ me

## 2016-11-30 NOTE — Assessment & Plan Note (Signed)
Pt's PE WNL.  UTD on Tdap.  Declines flu.  Check labs.  Anticipatory guidance provided.

## 2016-11-30 NOTE — Progress Notes (Signed)
   Subjective:    Patient ID: Christopher [name], male    DOB: 1975-12-21, 41 y.o.   MRN: [id number]  HPI CPE- UTD on Tdap, declines flu.     Review of Systems Patient reports no vision/hearing changes, anorexia, fever ,adenopathy, persistant/recurrent hoarseness, swallowing issues, chest pain, palpitations, edema, persistant/recurrent cough, hemoptysis, dyspnea (rest,exertional, paroxysmal nocturnal), gastrointestinal  bleeding (melena, rectal bleeding), abdominal pain, excessive heart burn, GU symptoms (dysuria, hematuria, voiding/incontinence issues) syncope, focal weakness, memory loss, numbness & tingling, skin/hair/nail changes, depression, anxiety, abnormal bruising/bleeding, musculoskeletal symptoms/signs.     Objective:   Physical Exam General Appearance:    Alert, cooperative, no distress, appears stated age  Head:    Normocephalic, without obvious abnormality, atraumatic  Eyes:    PERRL, conjunctiva/corneas clear, EOM's intact, fundi    benign, both eyes       Ears:    Normal TM's and external ear canals, both ears  Nose:   Nares normal, septum midline, mucosa normal, no drainage   or sinus tenderness  Throat:   Lips, mucosa, and tongue normal; teeth and gums normal  Neck:   Supple, symmetrical, trachea midline, no adenopathy;       thyroid:  No enlargement/tenderness/nodules  Back:     Symmetric, no curvature, ROM normal, no CVA tenderness  Lungs:     Clear to auscultation bilaterally, respirations unlabored  Chest wall:    No tenderness or deformity  Heart:    Regular rate and rhythm, S1 and S2 normal, no murmur, rub   or gallop  Abdomen:     Soft, non-tender, bowel sounds active all four quadrants,    no masses, no organomegaly  Genitalia:    Normal male without lesion, masses,discharge or tenderness  Rectal:    Deferred due to young age  Extremities:   Extremities normal, atraumatic, no cyanosis or edema  Pulses:   2+ and symmetric all extremities  Skin:   Skin color,  texture, turgor normal, no rashes or lesions  Lymph nodes:   Cervical, supraclavicular, and axillary nodes normal  Neurologic:   CNII-XII intact. Normal strength, sensation and reflexes      throughout          Assessment & Plan:

## 2016-11-30 NOTE — Telephone Encounter (Signed)
Pt would like to transfer care from Dr. [name] to Dr. [name], pt states that the [place] location is to far to travel.

## 2016-11-30 NOTE — Progress Notes (Signed)
Pre visit review using our clinic review tool, if applicable. No additional management support is needed unless otherwise documented below in the visit note. 

## 2016-12-01 NOTE — Telephone Encounter (Signed)
Ok with me 

## 2016-12-04 DIAGNOSIS — M25512 Pain in left shoulder: Secondary | ICD-10-CM | POA: Diagnosis not present

## 2016-12-04 NOTE — Telephone Encounter (Signed)
Could you see if someone at your location could call pt and schedule a NP appt with Dr. [name]?   Thanks, Levada Dy

## 2016-12-04 NOTE — Telephone Encounter (Signed)
Left message for patient to call back to get scheduled.  

## 2016-12-06 DIAGNOSIS — M25512 Pain in left shoulder: Secondary | ICD-10-CM | POA: Diagnosis not present

## 2016-12-12 DIAGNOSIS — M25512 Pain in left shoulder: Secondary | ICD-10-CM | POA: Diagnosis not present

## 2016-12-15 ENCOUNTER — Other Ambulatory Visit: Payer: Self-pay | Admitting: Family Medicine

## 2017-02-15 NOTE — Progress Notes (Signed)
Subjective:    Patient ID: Christopher [name], male    DOB: 02-Feb-1976, 41 y.o.   MRN: [id number]  HPI The patient is here for follow up.  Hypertension: He is taking his medication daily,but did not take it this morning. He is compliant with a low sodium diet.  He denies chest pain, palpitations, edema, shortness of breath and lightheadedness. He is not exercising regularly.  He does monitor his blood pressure at work sometimes  - it is usually 150/90's.     GERD:  He is taking his medication daily as prescribed.  He denies any GERD symptoms and feels his GERD is well controlled.   Alcohol abuse:  He is more of a social drinker.  He now drinks 1-2 days a week - 3-4 drinks at a time.  Prior to that he was drinking more drinks and more often.  He has had pancreatitis twice.   Sleep difficulties:  He takes restoril occasionally, but not often.  His sleep is restless but he generally gets enough sleep.   Triglyceridemia: He is taking his medication daily as prescribed.     Headaches: He has had headaches in his frontal for several months.  He gets them 1-2 times a week.     Medications and allergies reviewed with patient and updated if appropriate.  Patient Active Problem List   Diagnosis Date Noted  . Pancreatitis 04/18/2016  . Acute alcoholic pancreatitis 71/04/2693  . Neuroma of foot 10/11/2015  . Skin lesions 08/06/2015  . Left flank pain 04/01/2015  . Renal mass, right 07/31/2014  . Acute pancreatitis 07/22/2014  . AKI (acute kidney injury) ([place]) 07/22/2014  . Alcohol abuse 07/22/2014  . Fatty liver 07/22/2014  . Bilateral leg pain 07/02/2014  . 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 Pain in lower limb 03/02/2014  . Equinus deformity of foot, acquired 03/02/2014  . Shin splint 03/02/2014  . 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 ALLERGIC RHINITIS DUE TO POLLEN 02/14/2010  . TRANSAMINASES, SERUM, ELEVATED 05/19/2009  . FATTY LIVER DISEASE 02/22/2009  . Essential hypertension 03/09/2008  .  Hyperlipidemia 08/16/2007  . GERD 08/16/2007    Current Outpatient Prescriptions on File Prior to Visit  Medication Sig Dispense Refill  . clidinium-chlordiazePOXIDE (LIBRAX) 5-2.5 MG capsule TAKE 1 CAPSULE BY MOUTH  DAILY 90 capsule 1  . fenofibrate (TRICOR) 145 MG tablet TAKE 1 TABLET BY MOUTH  DAILY 90 tablet 1  . olmesartan (BENICAR) 40 MG tablet TAKE 1 TABLET BY MOUTH  DAILY 90 tablet 1  . pantoprazole (PROTONIX) 40 MG tablet TAKE 1 TABLET BY MOUTH  DAILY 90 tablet 1  . temazepam (RESTORIL) 15 MG capsule Take 1 capsule (15 mg total) by mouth at bedtime as needed for sleep. 90 capsule 1   No current facility-administered medications on file prior to visit.     Past Medical History:  Diagnosis Date  . GERD (gastroesophageal reflux disease)   . H/O hiatal hernia   . Hepatic steatosis   . Hyperlipidemia   . Hypertension   . IBS (irritable bowel syndrome)   . Kidney cyst, acquired   . Migraine    "probably monthly" (07/22/2014)  . 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 Pain in lower limb 03/02/2014  . Pancreatitis     Past Surgical History:  Procedure Laterality Date  . CARPAL TUNNEL RELEASE Right 2008  . COLONOSCOPY     IBS  . CYST EXCISION Right 2015   "2nd toe"  . KNEE ARTHROSCOPY Right 1992  .  REFRACTIVE SURGERY Bilateral 2013  . SHOULDER SURGERY    . UPPER GASTROINTESTINAL ENDOSCOPY    . UPPER GI ENDOSCOPY     chest pain  . WRIST FRACTURE SURGERY Right 2008   "got plate and screws"    Social History   Social History  . Marital status: Married    Spouse name: N/A  . Number of children: 2  . Years of education: N/A   Social History Main Topics  . Smoking status: Never Smoker  . Smokeless tobacco: Never Used  . Alcohol use 14.4 oz/week    24 Cans of beer per week     Comment: 07/22/2014 "case of beer/week or less; I don't drink qd; drink mostly on the weekends"  . Drug use: No  . Sexual activity: Yes   Other Topics Concern  . 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 Hypertension Mother   . Hyperlipidemia Mother   . Hypertension Father   . Colon cancer Neg Hx     Review of Systems  Constitutional: Negative for chills and fever.  Respiratory: Negative for cough, shortness of breath and wheezing.   Cardiovascular: Positive for palpitations (rare). Negative for chest pain and leg swelling.  Gastrointestinal: Negative for abdominal pain, blood in stool, constipation, diarrhea and nausea.  Neurological: Positive for headaches. Negative for dizziness and light-headedness.       Objective:   Vitals:   02/16/17 0902  BP: (!) 152/96  Pulse: 68  Resp: 16  Temp: 97.8 F (36.6 C)   Wt Readings from Last 3 Encounters:  02/16/17 220 lb (99.8 kg)  11/30/16 216 lb 4 oz (98.1 kg)  05/11/16 205 lb 4 oz (93.1 kg)   Body mass index is 29.84 kg/m.   Physical Exam    Constitutional: Appears well-developed and well-nourished. No distress.  HENT:  Head: Normocephalic and atraumatic.  Neck: Neck supple. No tracheal deviation present. No thyromegaly present.  No cervical lymphadenopathy Cardiovascular: Normal rate, regular rhythm and normal heart sounds.   No murmur heard. No carotid bruit .  No edema Pulmonary/Chest: Effort normal and breath sounds normal. No respiratory distress. No has no wheezes. No rales.  Abdomen: soft, non tender, non distended, no HSM Skin: Skin is warm and dry. Not diaphoretic.  Psychiatric: Normal mood and affect. Behavior is normal.      Assessment & Plan:    See Problem List for Assessment and Plan of chronic medical problems.    FU in 6 months

## 2017-02-16 ENCOUNTER — Ambulatory Visit (INDEPENDENT_AMBULATORY_CARE_PROVIDER_SITE_OTHER): Payer: Commercial Managed Care - HMO | Admitting: Internal Medicine

## 2017-02-16 ENCOUNTER — Encounter: Payer: Self-pay | Admitting: Internal Medicine

## 2017-02-16 VITALS — BP 152/96 | HR 68 | Temp 97.8°F | Resp 16 | Ht 72.0 in | Wt 220.0 lb

## 2017-02-16 DIAGNOSIS — K760 Fatty (change of) liver, not elsewhere classified: Secondary | ICD-10-CM

## 2017-02-16 DIAGNOSIS — Z8719 Personal history of other diseases of the digestive system: Secondary | ICD-10-CM

## 2017-02-16 DIAGNOSIS — F1010 Alcohol abuse, uncomplicated: Secondary | ICD-10-CM

## 2017-02-16 DIAGNOSIS — I10 Essential (primary) hypertension: Secondary | ICD-10-CM

## 2017-02-16 DIAGNOSIS — E781 Pure hyperglyceridemia: Secondary | ICD-10-CM

## 2017-02-16 DIAGNOSIS — K219 Gastro-esophageal reflux disease without esophagitis: Secondary | ICD-10-CM

## 2017-02-16 MED ORDER — AMLODIPINE BESYLATE 5 MG PO TABS: 5.0000 mg | ORAL_TABLET | Freq: Every day | ORAL | 3 refills | Status: DC

## 2017-02-16 NOTE — Assessment & Plan Note (Signed)
Has decreased alcohol intake but still drinking Stressed abstaining from all alcohol Planning on going to an [place]inclusive resort soon and states he is going to drink, but in "moderation"

## 2017-02-16 NOTE — Patient Instructions (Addendum)
I would recommend getting vaccinated against hepatitis A and B If you are not already.    Test(s) ordered today. Your results will be released to [facility] (or called to you) after review, usually within 72hours after test completion. If any changes need to be made, you will be notified at that same time.  All other Health Maintenance issues reviewed.   All recommended immunizations and age-appropriate screenings are up-to-date or discussed.  No immunizations administered today.   Medications reviewed and updated.  Changes include starting amlodipine 5 mg daily for your blood pressure.   Your prescription(s) have been submitted to your pharmacy. Please take as directed and contact our office if you believe you are having problem(s) with the medication(s).   Please followup in 6 months

## 2017-02-16 NOTE — Assessment & Plan Note (Signed)
Continue fenofibrate Avoid alcohol

## 2017-02-16 NOTE — Assessment & Plan Note (Signed)
?   Controlled - has had high measures at work and has gained weight over last several months Start amlodipine 5 mg daily Continue benicar 40 mg daily Increase exercise Continue low sodium diet

## 2017-02-16 NOTE — Assessment & Plan Note (Signed)
GERD controlled Continue daily medication  

## 2017-02-16 NOTE — Progress Notes (Signed)
Pre visit review using our clinic review tool, if applicable. No additional management support is needed unless otherwise documented below in the visit note. 

## 2017-02-16 NOTE — Assessment & Plan Note (Signed)
No current symptoms Stressed avoiding alcohol - still drinks

## 2017-02-16 NOTE — Assessment & Plan Note (Signed)
Advised weight loss Abstaining from alcohol Advised Hep A/B vaccinations - he thinks he has had them through work and will check

## 2017-02-20 ENCOUNTER — Encounter: Payer: Self-pay | Admitting: Internal Medicine

## 2017-02-21 NOTE — Telephone Encounter (Signed)
Please update immunization record - I printed the record attached.

## 2017-02-27 ENCOUNTER — Inpatient Hospital Stay (HOSPITAL_COMMUNITY)
Admission: EM | Admit: 2017-02-27 | Discharge: 2017-03-01 | DRG: 440 | Disposition: A | Discharge status: Home / Self Care | Payer: Commercial Managed Care - HMO | Attending: Family Medicine | Admitting: Family Medicine

## 2017-02-27 ENCOUNTER — Encounter (HOSPITAL_COMMUNITY): Payer: Self-pay | Admitting: Emergency Medicine

## 2017-02-27 DIAGNOSIS — K219 Gastro-esophageal reflux disease without esophagitis: Secondary | ICD-10-CM | POA: Diagnosis present

## 2017-02-27 DIAGNOSIS — E785 Hyperlipidemia, unspecified: Secondary | ICD-10-CM | POA: Diagnosis present

## 2017-02-27 DIAGNOSIS — E781 Pure hyperglyceridemia: Secondary | ICD-10-CM | POA: Diagnosis present

## 2017-02-27 DIAGNOSIS — K760 Fatty (change of) liver, not elsewhere classified: Secondary | ICD-10-CM | POA: Diagnosis present

## 2017-02-27 DIAGNOSIS — K852 Alcohol induced acute pancreatitis without necrosis or infection: Principal | ICD-10-CM | POA: Diagnosis present

## 2017-02-27 DIAGNOSIS — Z8249 Family history of ischemic heart disease and other diseases of the circulatory system: Secondary | ICD-10-CM | POA: Diagnosis not present

## 2017-02-27 DIAGNOSIS — N281 Cyst of kidney, acquired: Secondary | ICD-10-CM | POA: Diagnosis present

## 2017-02-27 DIAGNOSIS — K861 Other chronic pancreatitis: Secondary | ICD-10-CM | POA: Diagnosis not present

## 2017-02-27 DIAGNOSIS — F1010 Alcohol abuse, uncomplicated: Secondary | ICD-10-CM | POA: Diagnosis not present

## 2017-02-27 DIAGNOSIS — I10 Essential (primary) hypertension: Secondary | ICD-10-CM | POA: Diagnosis present

## 2017-02-27 DIAGNOSIS — F1020 Alcohol dependence, uncomplicated: Secondary | ICD-10-CM | POA: Diagnosis present

## 2017-02-27 DIAGNOSIS — K589 Irritable bowel syndrome without diarrhea: Secondary | ICD-10-CM | POA: Diagnosis present

## 2017-02-27 DIAGNOSIS — Z79899 Other long term (current) drug therapy: Secondary | ICD-10-CM

## 2017-02-27 DIAGNOSIS — Z885 Allergy status to narcotic agent status: Secondary | ICD-10-CM | POA: Diagnosis not present

## 2017-02-27 DIAGNOSIS — K859 Acute pancreatitis without necrosis or infection, unspecified: Secondary | ICD-10-CM | POA: Diagnosis present

## 2017-02-27 LAB — CBC
HCT: 43.5 % (ref 39.0–52.0)
HCT: 43 % (ref 39.0–52.0)
Hemoglobin: 14.6 g/dL (ref 13.0–17.0)
Hemoglobin: 14.5 g/dL (ref 13.0–17.0)
MCH: 29.3 pg (ref 26.0–34.0)
MCH: 29.4 pg (ref 26.0–34.0)
MCHC: 33.6 g/dL (ref 30.0–36.0)
MCHC: 33.7 g/dL (ref 30.0–36.0)
MCV: 87.3 fL (ref 78.0–100.0)
MCV: 87.2 fL (ref 78.0–100.0)
PLATELETS: 278 10*3/uL (ref 150–400)
Platelets: 278 10*3/uL (ref 150–400)
RBC: 4.98 MIL/uL (ref 4.22–5.81)
RBC: 4.93 MIL/uL (ref 4.22–5.81)
RDW: 12.8 % (ref 11.5–15.5)
RDW: 13.1 % (ref 11.5–15.5)
WBC: 6.5 10*3/uL (ref 4.0–10.5)
WBC: 10.5 10*3/uL (ref 4.0–10.5)

## 2017-02-27 LAB — COMPREHENSIVE METABOLIC PANEL
ALK PHOS: 41 U/L (ref 38–126)
ALT: 73 U/L — ABNORMAL HIGH (ref 17–63)
ANION GAP: 8 (ref 5–15)
AST: 48 U/L — ABNORMAL HIGH (ref 15–41)
Albumin: 4.3 g/dL (ref 3.5–5.0)
BUN: 16 mg/dL (ref 6–20)
CALCIUM: 9 mg/dL (ref 8.9–10.3)
CO2: 26 mmol/L (ref 22–32)
CREATININE: 1.14 mg/dL (ref 0.61–1.24)
Chloride: 106 mmol/L (ref 101–111)
GFR calc Af Amer: >60 mL/min (ref >60)
GFR calc non Af Amer: >60 mL/min (ref >60)
GLUCOSE: 137 mg/dL — AB (ref 65–99)
Potassium: 3.8 mmol/L (ref 3.5–5.1)
SODIUM: 140 mmol/L (ref 135–145)
TOTAL PROTEIN: 7.5 g/dL (ref 6.5–8.1)
Total Bilirubin: 0.5 mg/dL (ref 0.3–1.2)

## 2017-02-27 LAB — CREATININE, SERUM
Creatinine, Ser: 1.04 mg/dL (ref 0.61–1.24)
GFR calc Af Amer: >60 mL/min (ref >60)

## 2017-02-27 LAB — URINALYSIS, ROUTINE W REFLEX MICROSCOPIC
BILIRUBIN URINE: NEGATIVE
Glucose, UA: NEGATIVE mg/dL
HGB URINE DIPSTICK: NEGATIVE
KETONES UR: NEGATIVE mg/dL
Leukocytes, UA: NEGATIVE
Nitrite: NEGATIVE
PROTEIN: NEGATIVE mg/dL
Specific Gravity, Urine: 1.013 (ref 1.005–1.030)
pH: 7 (ref 5.0–8.0)

## 2017-02-27 LAB — LIPASE, BLOOD: LIPASE: 1047 U/L — AB (ref 11–51)

## 2017-02-27 MED ORDER — VITAMIN B-1 100 MG PO TABS
100.0000 mg | ORAL_TABLET | Freq: Every day | ORAL | Status: DC
  Administered 2017-02-27 – 2017-03-01 (×3): 100 mg via ORAL
  Filled 2017-02-27 (×3): qty 1

## 2017-02-27 MED ORDER — HYDROMORPHONE HCL 1 MG/ML IJ SOLN
1.0000 mg | Freq: Once | INTRAMUSCULAR | Status: AC
  Administered 2017-02-27: 1 mg via INTRAVENOUS
  Filled 2017-02-27: qty 1

## 2017-02-27 MED ORDER — SODIUM CHLORIDE 0.9 % IV SOLN
INTRAVENOUS | Status: DC
  Administered 2017-02-27 – 2017-03-01 (×7): via INTRAVENOUS

## 2017-02-27 MED ORDER — HYDRALAZINE HCL 20 MG/ML IJ SOLN: 10.0000 mg | Freq: Four times a day (QID) | INTRAMUSCULAR | Status: DC | PRN

## 2017-02-27 MED ORDER — CILIDINIUM-CHLORDIAZEPOXIDE 2.5-5 MG PO CAPS
1.0000 | ORAL_CAPSULE | Freq: Every day | ORAL | Status: DC
  Administered 2017-02-27 – 2017-03-01 (×3): 1 via ORAL
  Filled 2017-02-27 (×3): qty 1

## 2017-02-27 MED ORDER — ADULT MULTIVITAMIN W/MINERALS CH: 1.0000 | ORAL_TABLET | Freq: Every day | ORAL | Status: DC

## 2017-02-27 MED ORDER — IRBESARTAN 150 MG PO TABS
300.0000 mg | ORAL_TABLET | Freq: Every day | ORAL | Status: DC
  Administered 2017-02-27 – 2017-03-01 (×3): 300 mg via ORAL
  Filled 2017-02-27 (×3): qty 2

## 2017-02-27 MED ORDER — THIAMINE HCL 100 MG/ML IJ SOLN
100.0000 mg | Freq: Every day | INTRAMUSCULAR | Status: DC
  Filled 2017-02-27: qty 2

## 2017-02-27 MED ORDER — ENOXAPARIN SODIUM 40 MG/0.4ML ~~LOC~~ SOLN
40.0000 mg | Freq: Every day | SUBCUTANEOUS | Status: DC
  Administered 2017-02-27 – 2017-03-01 (×3): 40 mg via SUBCUTANEOUS
  Filled 2017-02-27 (×4): qty 0.4

## 2017-02-27 MED ORDER — SODIUM CHLORIDE 0.9 % IV BOLUS (SEPSIS)
1000.0000 mL | Freq: Once | INTRAVENOUS | Status: AC
  Administered 2017-02-27: 1000 mL via INTRAVENOUS

## 2017-02-27 MED ORDER — HYDROMORPHONE HCL 1 MG/ML IJ SOLN
2.0000 mg | INTRAMUSCULAR | Status: DC | PRN
  Administered 2017-02-27 – 2017-02-28 (×7): 2 mg via INTRAVENOUS
  Filled 2017-02-27 (×7): qty 2

## 2017-02-27 MED ORDER — ONDANSETRON HCL 4 MG PO TABS
4.0000 mg | ORAL_TABLET | Freq: Four times a day (QID) | ORAL | Status: DC | PRN
  Administered 2017-02-27: 4 mg via ORAL
  Filled 2017-02-27: qty 1

## 2017-02-27 MED ORDER — LORAZEPAM 2 MG/ML IJ SOLN: 1.0000 mg | Freq: Four times a day (QID) | INTRAMUSCULAR | Status: DC | PRN

## 2017-02-27 MED ORDER — TEMAZEPAM 15 MG PO CAPS: 15.0000 mg | ORAL_CAPSULE | Freq: Every evening | ORAL | Status: DC | PRN

## 2017-02-27 MED ORDER — AMLODIPINE BESYLATE 5 MG PO TABS
5.0000 mg | ORAL_TABLET | Freq: Every day | ORAL | Status: DC
  Administered 2017-02-27 – 2017-03-01 (×3): 5 mg via ORAL
  Filled 2017-02-27 (×3): qty 1

## 2017-02-27 MED ORDER — FENOFIBRATE 160 MG PO TABS
160.0000 mg | ORAL_TABLET | Freq: Every day | ORAL | Status: DC
  Administered 2017-02-27 – 2017-03-01 (×3): 160 mg via ORAL
  Filled 2017-02-27 (×3): qty 1

## 2017-02-27 MED ORDER — ACETAMINOPHEN 325 MG PO TABS
650.0000 mg | ORAL_TABLET | Freq: Four times a day (QID) | ORAL | Status: DC | PRN
  Administered 2017-03-01: 650 mg via ORAL
  Filled 2017-02-27: qty 2

## 2017-02-27 MED ORDER — HYDROMORPHONE HCL 1 MG/ML IJ SOLN: 2.0000 mg | INTRAMUSCULAR | Status: DC | PRN

## 2017-02-27 MED ORDER — LORAZEPAM 1 MG PO TABS: 1.0000 mg | ORAL_TABLET | Freq: Four times a day (QID) | ORAL | Status: DC | PRN

## 2017-02-27 MED ORDER — FOLIC ACID 1 MG PO TABS
1.0000 mg | ORAL_TABLET | Freq: Every day | ORAL | Status: DC
  Administered 2017-02-27 – 2017-03-01 (×3): 1 mg via ORAL
  Filled 2017-02-27 (×3): qty 1

## 2017-02-27 MED ORDER — ONDANSETRON HCL 4 MG/2ML IJ SOLN
4.0000 mg | INTRAMUSCULAR | Status: DC | PRN
  Administered 2017-02-27 (×2): 4 mg via INTRAVENOUS
  Filled 2017-02-27 (×2): qty 2

## 2017-02-27 MED ORDER — PANTOPRAZOLE SODIUM 40 MG PO TBEC
40.0000 mg | DELAYED_RELEASE_TABLET | Freq: Every day | ORAL | Status: DC
Start: 2017-02-27 — End: 2017-03-01
  Administered 2017-02-27 – 2017-03-01 (×3): 40 mg via ORAL
  Filled 2017-02-27 (×3): qty 1

## 2017-02-27 MED ORDER — ACETAMINOPHEN 650 MG RE SUPP: 650.0000 mg | Freq: Four times a day (QID) | RECTAL | Status: DC | PRN

## 2017-02-27 MED ORDER — ADULT MULTIVITAMIN W/MINERALS CH
1.0000 | ORAL_TABLET | Freq: Every day | ORAL | Status: DC
  Administered 2017-02-28 – 2017-03-01 (×2): 1 via ORAL
  Filled 2017-02-27 (×3): qty 1

## 2017-02-27 MED ORDER — ONDANSETRON HCL 4 MG/2ML IJ SOLN
4.0000 mg | Freq: Once | INTRAMUSCULAR | Status: AC
  Administered 2017-02-27: 4 mg via INTRAVENOUS
  Filled 2017-02-27: qty 2

## 2017-02-27 NOTE — ED Notes (Signed)
Pharmacy at bedside to obtain med reconciliation.

## 2017-02-27 NOTE — ED Triage Notes (Signed)
Pt is c/o abd pain that is in the epigastric area and radiating into his back  Pt has nausea without vomiting or diarrhea  Pt says pain started about 0330 this morning and has progressively gotten worse  Pt has hx of pancreatitis

## 2017-02-27 NOTE — ED Notes (Signed)
Pt given urinal and made aware of need for urine sample. 

## 2017-02-27 NOTE — ED Provider Notes (Signed)
[facility] DEPT Provider Note   CSN: [id number] Arrival date & time: 02/27/17  0537     History   Chief Complaint Chief Complaint  Patient presents with  . Abdominal Pain    HPI Christopher [name] is a 41 y.o. male.  The history is provided by the patient and medical records.  Abdominal Pain   Associated symptoms include nausea.    41 year old male with history of GERD, fatty liver, hyperlipidemia, hypertension, IBS, pancreatitis, presenting to the ED for abdominal pain. Patient reports he began having pain about 3:30 this morning which woke him from sleep. Pain localized to the epigastrium with some radiation to the back. He reports associated nausea but denies active emesis. No fever or chills. Does report history of pancreatitis in the past, saw GI for this once but has not had any regular follow-up.  States he did drink alcohol over the weekend on Saturday (3 days ago), none since then.  No prior abdominal surgeries.  No other complaints at this time.  Past Medical History:  Diagnosis Date  . GERD (gastroesophageal reflux disease)   . H/O hiatal hernia   . Hepatic steatosis   . Hyperlipidemia   . Hypertension   . IBS (irritable bowel syndrome)   . Kidney cyst, acquired   . Migraine    "probably monthly" (07/22/2014)  . 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 Pain in lower limb 03/02/2014  . Pancreatitis     Patient Active Problem List   Diagnosis Date Noted  . History of pancreatitis 02/16/2017  . Skin lesions 08/06/2015  . Renal mass, right 07/31/2014  . Alcohol abuse 07/22/2014  . Fatty liver 07/22/2014  . Equinus deformity of foot, acquired 03/02/2014  . 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 ALLERGIC RHINITIS DUE TO POLLEN 02/14/2010  . TRANSAMINASES, SERUM, ELEVATED 05/19/2009  . Essential hypertension 03/09/2008  . Hypertriglyceridemia 08/16/2007  . GERD 08/16/2007    Past Surgical History:  Procedure Laterality Date  . CARPAL TUNNEL RELEASE Right 2008  . COLONOSCOPY     IBS    . CYST EXCISION Right 2015   "2nd toe"  . KNEE ARTHROSCOPY Right 1992  . LASIK    . REFRACTIVE SURGERY Bilateral 2013  . SHOULDER SURGERY  10/2016  . UPPER GASTROINTESTINAL ENDOSCOPY    . UPPER GI ENDOSCOPY     chest pain  . WRIST FRACTURE SURGERY Right 2008   "got plate and screws"       Home Medications    Prior to Admission medications   Medication Sig Start Date End Date Taking? Authorizing Provider  amLODipine (NORVASC) 5 MG tablet Take 1 tablet (5 mg total) by mouth daily. 02/16/17   [name], MD  clidinium-chlordiazePOXIDE (LIBRAX) 5-2.5 MG capsule TAKE 1 CAPSULE BY MOUTH  DAILY 12/15/16   Midge Minium, MD  fenofibrate (TRICOR) 145 MG tablet TAKE 1 TABLET BY MOUTH  DAILY 12/15/16   Midge Minium, MD  olmesartan (BENICAR) 40 MG tablet TAKE 1 TABLET BY MOUTH  DAILY 12/15/16   Midge Minium, MD  pantoprazole (PROTONIX) 40 MG tablet TAKE 1 TABLET BY MOUTH  DAILY 12/15/16   Midge Minium, MD  temazepam (RESTORIL) 15 MG capsule Take 1 capsule (15 mg total) by mouth at bedtime as needed for sleep. 04/17/14   Ricard Dillon, MD    Family History Family History  Problem Relation Age of Onset  . Hypertension Mother   . Hyperlipidemia Mother   . Hypertension Father   .  Alzheimer's disease Maternal Grandmother   . Alcohol abuse Maternal Grandfather   . Alzheimer's disease Paternal Grandmother   . Alcohol abuse Paternal Grandfather   . Colon cancer Neg Hx     Social History Social History  Substance Use Topics  . Smoking status: Never Smoker  . Smokeless tobacco: Never Used  . Alcohol use Yes     Allergies   Morphine and related and Oxycodone   Review of Systems Review of Systems  Gastrointestinal: Positive for abdominal pain and nausea.  All other systems reviewed and are negative.    Physical Exam Updated Vital Signs BP (!) 175/113 (BP Location: Left Arm)   Pulse 64   Resp 18   SpO2 97%   Physical Exam  Constitutional: He is  oriented to person, place, and time. He appears well-developed and well-nourished.  Appears uncomfortable, damp cloth on forehead  HENT:  Head: Normocephalic and atraumatic.  Mouth/Throat: Oropharynx is clear and moist.  Eyes: Conjunctivae and EOM are normal. Pupils are equal, round, and reactive to light.  Neck: Normal range of motion.  Cardiovascular: Normal rate, regular rhythm and normal heart sounds.   Pulmonary/Chest: Effort normal and breath sounds normal. No respiratory distress. He has no wheezes.  Abdominal: Soft. Bowel sounds are normal. There is tenderness in the epigastric area. There is no rigidity and no guarding.  Musculoskeletal: Normal range of motion.  Neurological: He is alert and oriented to person, place, and time.  Skin: Skin is warm and dry.  Psychiatric: He has a normal mood and affect.  Nursing note and vitals reviewed.    ED Treatments / Results  Labs (all labs ordered are listed, but only abnormal results are displayed) Labs Reviewed  CBC  LIPASE, BLOOD  COMPREHENSIVE METABOLIC PANEL  URINALYSIS, ROUTINE W REFLEX MICROSCOPIC    EKG  EKG Interpretation None       Radiology No results found.  Procedures Procedures (including critical care time)  Medications Ordered in ED Medications  ondansetron (ZOFRAN) injection 4 mg (not administered)  HYDROmorphone (DILAUDID) injection 1 mg (not administered)  sodium chloride 0.9 % bolus 1,000 mL (not administered)     Initial Impression / Assessment and Plan / ED Course  I have reviewed the triage vital signs and the nursing notes.  Pertinent labs & imaging results that were available during my care of the patient were reviewed by me and considered in my medical decision making (see chart for details).  41 y.o. M here with abdominal pain.  Hx of pancreatitis, reports this feels the same.  Did consume alcohol over the weekend.  He is afebrile, non-toxic.  Tenderness to palpation epigastrium without  rebound or guarding. He does appear uncomfortable. We'll plan for labs. IV fluids, pain and nausea meds ordered.  Lab work as above-- lipase >1000.  Minimal improvement after IVF and pain meds.  Additional doses reordered.  Pancreatitis likely secondary to his recent alcohol use. He has had prior CT scan as well as MRI which were negative for any masses or acute pseudocyst, inflammatory changes seen. Given that symptoms are similar and work up thus far consistent with recurrent pancreatitis, do not feel he needs emergent repeat imaging at this time. I discussed case with hospitalist, Dr. Clementeen Graham who will admit for ongoing care.  Final Clinical Impressions(s) / ED Diagnoses   Final diagnoses:  Alcohol-induced acute pancreatitis, unspecified complication status    New Prescriptions New Prescriptions   No medications on file     Lattie Haw  Chalmers Guest, PA-C 02/27/17 3403    Ezequiel Essex, MD 02/27/17 [id number]

## 2017-02-27 NOTE — H&P (Signed)
TRH H&P   Patient Demographics:    Christopher [name], is a 41 y.o. male  MRN: [id number]   DOB - 10-Aug-1976  Admit Date - 02/27/2017  Outpatient Primary MD for the patient is [name], MD  Referring MD: ED  Outpatient Specialists: None  Patient coming from: Home  Chief Complaint  Patient presents with  . Abdominal Pain      HPI:    Christopher [name]  is a 41 y.o. male, With history of alcoholic pancreatitis, ongoing alcohol use, GERD, fatty liver, hypertriglyceridemia who presented to the ED with acute onset of epigastric pain since this morning. Patient woke up at 3 AM with acute sharp epigastric pain radiating to the right upper quadrant. The pain was 10/10 in severity associated with some nausea but no vomiting. The pain then radiated to his back. He reports symptoms to be similar to his prior pancreatitis attacks. Patient and his wife at bedside informed that he normally drinks 1-2 beers a week but during the past weekend they had some get-together at home and he drank almost 8-9 beers. He denies any fevers, chills, headache, blurred vision, dizziness, chest pain, palpitations, shortness of breath, diarrhea, dysuria, weakness of the extremities. Denies any new medications or sick contacts.  Course in the ED His blood pressure was elevated to 175/113 mmHg, remaining vitals were stable. Blood work showed normal CBC and BMET, LFTs showed mildly elevated transaminases (around baseline from his chronic fatty liver). Lipase was markedly elevated >1000. Patient given 2 L IV normal saline bolus, IV Dilaudid 1 mg 2 and IV Zofran after which his pain improved to 5/10. Hospitalist admission requested for acute pancreatitis.     Review of systems:    In addition to the HPI above,  No Fever-chills, No Headache, No changes with Vision or hearing, No problems swallowing food or  Liquids, No Chest pain, Cough or Shortness of Breath, Abdominal pain+, nausea+, no vomiting, Bowel movements are regular, No Blood in stool or Urine, No dysuria, No new skin rashes or bruises, No new joints pains-aches,  No new weakness, tingling, numbness in any extremity, No recent weight gain or loss, No polyuria, polydypsia or polyphagia, No significant Mental Stressors.  A full 10 point Review of Systems was done, except as stated above, all other Review of Systems were negative.   With Past History of the following :    Past Medical History:  Diagnosis Date  . GERD (gastroesophageal reflux disease)   . H/O hiatal hernia   . Hepatic steatosis   . Hyperlipidemia   . Hypertension   . IBS (irritable bowel syndrome)   . Kidney cyst, acquired   . Migraine    "probably monthly" (07/22/2014)  . 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 Pain in lower limb 03/02/2014  . Pancreatitis       Past Surgical History:  Procedure Laterality  Date  . CARPAL TUNNEL RELEASE Right 2008  . COLONOSCOPY     IBS  . CYST EXCISION Right 2015   "2nd toe"  . KNEE ARTHROSCOPY Right 1992  . LASIK    . REFRACTIVE SURGERY Bilateral 2013  . SHOULDER SURGERY  10/2016  . UPPER GASTROINTESTINAL ENDOSCOPY    . UPPER GI ENDOSCOPY     chest pain  . WRIST FRACTURE SURGERY Right 2008   "got plate and screws"      Social History:     Social History  Substance Use Topics  . Smoking status: Never Smoker  . Smokeless tobacco: Never Used  . Alcohol use Yes     Lives - At home  Mobility - independent   Family History :     Family History  Problem Relation Age of Onset  . Hypertension Mother   . Hyperlipidemia Mother   . Hypertension Father   . Alzheimer's disease Maternal Grandmother   . Alcohol abuse Maternal Grandfather   . Alzheimer's disease Paternal Grandmother   . Alcohol abuse Paternal Grandfather   . Colon cancer Neg Hx       Home Medications:   Prior to Admission medications    Medication Sig Start Date End Date Taking? Authorizing Provider  amLODipine (NORVASC) 5 MG tablet Take 1 tablet (5 mg total) by mouth daily. 02/16/17  Yes [name], MD  clidinium-chlordiazePOXIDE (LIBRAX) 5-2.5 MG capsule TAKE 1 CAPSULE BY MOUTH  DAILY 12/15/16  Yes Midge Minium, MD  fenofibrate (TRICOR) 145 MG tablet TAKE 1 TABLET BY MOUTH  DAILY 12/15/16  Yes Midge Minium, MD  ibuprofen (ADVIL,MOTRIN) 200 MG tablet Take 400 mg by mouth every 6 (six) hours as needed for fever, headache, mild pain, moderate pain or cramping.   Yes Historical Provider, MD  Multiple Vitamin (MULTIVITAMIN WITH MINERALS) TABS tablet Take 1 tablet by mouth daily.   Yes Historical Provider, MD  olmesartan (BENICAR) 40 MG tablet TAKE 1 TABLET BY MOUTH  DAILY 12/15/16  Yes Midge Minium, MD  pantoprazole (PROTONIX) 40 MG tablet TAKE 1 TABLET BY MOUTH  DAILY 12/15/16  Yes Midge Minium, MD  temazepam (RESTORIL) 15 MG capsule Take 1 capsule (15 mg total) by mouth at bedtime as needed for sleep. 04/17/14  Yes Ricard Dillon, MD     Allergies:     Allergies  Allergen Reactions  . Morphine And Related Nausea And Vomiting and Other (See Comments)    Sweats  . Oxycodone Other (See Comments)    Sweats     Physical Exam:   Vitals  Blood pressure (!) 163/113, pulse 65, resp. rate (!) 21, SpO2 92 %.    General : Middle aged male not in distress HEENT: No pallor, no icterus, moist mucosa, supple neck Chest: Clear to auscultation bilaterally, no added sounds CVS: Normal S1 and S2, no murmurs rub or gallop GI: Soft, nondistended, bowel sounds present, epigastric tenderness to palpation Musculoskeletal: Warm, no edema  CNS: Alert and oriented   Data Review:    CBC  Recent Labs Lab 02/27/17 0621  WBC 6.5  HGB 14.6  HCT 43.5  PLT 278  MCV 87.3  MCH 29.3  MCHC 33.6  RDW 12.8    ------------------------------------------------------------------------------------------------------------------  Chemistries   Recent Labs Lab 02/27/17 0621  NA 140  K 3.8  CL 106  CO2 26  GLUCOSE 137*  BUN 16  CREATININE 1.14  CALCIUM 9.0  AST 48*  ALT 73*  ALKPHOS 41  BILITOT 0.5   ------------------------------------------------------------------------------------------------------------------ estimated creatinine clearance is 104.3 mL/min (by C-G formula based on SCr of 1.14 mg/dL). ------------------------------------------------------------------------------------------------------------------ No results for input(s): TSH, T4TOTAL, T3FREE, THYROIDAB in the last 72 hours.  Invalid input(s): FREET3  Coagulation profile No results for input(s): INR, PROTIME in the last 168 hours. ------------------------------------------------------------------------------------------------------------------- No results for input(s): DDIMER in the last 72 hours. -------------------------------------------------------------------------------------------------------------------  Cardiac Enzymes No results for input(s): CKMB, TROPONINI, MYOGLOBIN in the last 168 hours.  Invalid input(s): CK ------------------------------------------------------------------------------------------------------------------ No results found for: BNP   ---------------------------------------------------------------------------------------------------------------  Urinalysis    Component Value Date/Time   COLORURINE YELLOW 02/27/2017 [place] 02/27/2017 0844   LABSPEC 1.013 02/27/2017 0844   PHURINE 7.0 02/27/2017 0844   GLUCOSEU NEGATIVE 02/27/2017 0844   HGBUR NEGATIVE 02/27/2017 0844   HGBUR negative 02/15/2009 0833   BILIRUBINUR NEGATIVE 02/27/2017 0844   BILIRUBINUR negative 05/11/2016 0825   KETONESUR NEGATIVE 02/27/2017 0844   PROTEINUR NEGATIVE 02/27/2017 0844    UROBILINOGEN 0.2 05/11/2016 0825   UROBILINOGEN 1.0 07/24/2014 0313   NITRITE NEGATIVE 02/27/2017 0844   LEUKOCYTESUR NEGATIVE 02/27/2017 0844    ----------------------------------------------------------------------------------------------------------------   Imaging Results:    No results found.  My personal review of [facility]:LEXN   Assessment & Plan:    Principal Problem:   Pancreatitis, alcoholic, acute Patient has acute recurrent alcoholic pancreatitis. Admit to MedSurg. Keep nothing by mouth. Aggressive IV hydration with normal saline @1  25 mL per hour. Monitor daily lites and replenish. Pain control with when necessary IV Dilaudid. Supportive care with Tylenol and antiemetics. Monitor lipase and LFTs in the morning. If symptoms unimproved or worsen will obtain CT scan of the abdomen. (Given improvement in symptoms, stable vitals and LFTs he does not need abdominal imaging at present).  Patient counseled strongly on quitting alcohol. This is his third episode of acute pancreatitis in last 2 years attribute it to heavy drinking. He also has hypertriglyceridemia (last triglycerides of 233 in January 2018). I have discussed in detail with patient and his wife that he needs to stop alcohol completely and both understand. Given his recurrent pancreatitis patient would benefit from being on pancreatic enzymes upon discharge.  Patient has been seen by [facility] in past and can be consulted if needed.   Active Problems:   Hypertriglyceridemia Resume [facility]. Last triglyceride levels of 233.    Essential hypertension/accelerated hypertension Increased blood pressure likely triggered by pain. Resume home blood pressure medications. Add when necessary IV hydralazine.    Alcohol abuse No signs of withdrawal. Will place on CIWA.    Fatty liver Secondary to heavy alcohol use. LFTs are on baseline.  GERD Continue PPI       DVT Prophylaxis : Lovenox  AM Labs Ordered, also  please review Full Orders  Family Communication: Admission, patients condition and plan of care including tests being ordered have been discussed with the patient and his wife at bedside   Code Status full code  Likely DC to  home  Condition GUARDED   Consults called: None  Admission status: Inpatient  Time spent in minutes : 70   Louellen Molder M.D on 02/27/2017 at 9:30 AM  Between 7am to 7pm - Pager - [phone]. After 7pm go to [email or web address] - password [facility]  [facility] - Office  [phone]

## 2017-02-27 NOTE — ED Notes (Signed)
Report given to [facility] on 3W. No further questions at this time.

## 2017-02-28 DIAGNOSIS — E781 Pure hyperglyceridemia: Secondary | ICD-10-CM

## 2017-02-28 DIAGNOSIS — K760 Fatty (change of) liver, not elsewhere classified: Secondary | ICD-10-CM

## 2017-02-28 LAB — COMPREHENSIVE METABOLIC PANEL
ALK PHOS: 34 U/L — AB (ref 38–126)
ALT: 53 U/L (ref 17–63)
ANION GAP: 6 (ref 5–15)
AST: 31 U/L (ref 15–41)
Albumin: 3.8 g/dL (ref 3.5–5.0)
BILIRUBIN TOTAL: 1.2 mg/dL (ref 0.3–1.2)
BUN: 15 mg/dL (ref 6–20)
CALCIUM: 8.1 mg/dL — AB (ref 8.9–10.3)
CO2: 29 mmol/L (ref 22–32)
Chloride: 102 mmol/L (ref 101–111)
Creatinine, Ser: 1.02 mg/dL (ref 0.61–1.24)
GFR calc Af Amer: >60 mL/min (ref >60)
Glucose, Bld: 99 mg/dL (ref 65–99)
POTASSIUM: 4 mmol/L (ref 3.5–5.1)
Sodium: 137 mmol/L (ref 135–145)
TOTAL PROTEIN: 6.7 g/dL (ref 6.5–8.1)

## 2017-02-28 LAB — LIPASE, BLOOD: Lipase: 370 U/L — ABNORMAL HIGH (ref 11–51)

## 2017-02-28 LAB — HIV ANTIBODY (ROUTINE TESTING W REFLEX): HIV Screen 4th Generation wRfx: NONREACTIVE

## 2017-02-28 MED ORDER — HYDROMORPHONE HCL 4 MG PO TABS
4.0000 mg | ORAL_TABLET | ORAL | Status: DC | PRN
Start: 2017-02-28 — End: 2017-03-01
  Administered 2017-02-28 – 2017-03-01 (×3): 4 mg via ORAL
  Filled 2017-02-28 (×3): qty 1

## 2017-02-28 MED ORDER — PANCRELIPASE (LIP-PROT-AMYL) 12000-38000 UNITS PO CPEP
24000.0000 [IU] | ORAL_CAPSULE | Freq: Three times a day (TID) | ORAL | Status: DC
  Administered 2017-02-28 – 2017-03-01 (×3): 24000 [IU] via ORAL
  Filled 2017-02-28 (×3): qty 2

## 2017-02-28 MED ORDER — DIPHENHYDRAMINE HCL 25 MG PO CAPS
25.0000 mg | ORAL_CAPSULE | ORAL | Status: DC | PRN
  Administered 2017-02-28: 25 mg via ORAL
  Filled 2017-02-28: qty 1

## 2017-02-28 NOTE — Progress Notes (Signed)
PROGRESS NOTE  Alejandra Barna  UXL:[id number] DOB: Dec 11, 1975 DOA: 02/27/2017 PCP: [name], MD   Brief Narrative: Christopher [name] is a 41 y.o. male with a history of alcoholic pancreatitis, ongoing alcohol use, GERD, fatty liver, and hypertriglyceridemia who presented to the ED 4/3 with acute onset of sharp, severe, constant epigastric pain radiating to the back and RUQ for several hours associated with nausea without vomiting, similar to his prior pancreatitis attacks. His wife and he report he had been drinking at a party over the weekend ~9 beers, up from 1 - 2 beers weekly. On arrival, BP was elevated to 175/113 mmHg, remaining vitals were stable. Blood work showed normal CBC and BMET, LFTs showed mildly elevated transaminases (around baseline from his chronic fatty liver). Lipase was markedly elevated >1000. The patient was given 2 L normal saline bolus, IV Dilaudid 1 mg 2 and IV Zofran after which his pain improved to 5/10. Hospitalist admission requested for acute pancreatitis. Pain has improved somewhat, and pt is requesting water/ice chips. Normal UOP, lipase down to 370 this AM.  Assessment & Plan: Principal Problem:   Pancreatitis, alcoholic, acute Active Problems:   Hypertriglyceridemia   Essential hypertension   Alcohol abuse   Fatty liver   Acute pancreatitis   Accelerated hypertension  Acute, recurrent, alcoholic pancreatitis: Lipase shows improvement. Seen by [facility] in the past.  - Pain controlled, spacing out IV narcotics. Start ice chips, water today and reevaluate in AM - Continue NS @125cc /hr.  - Monitor labs daily.  - IV dilaudid for now, discussed transitioning to oral pain medications once we start diet. Plan to start creon as well.  - No indication for reimaging at this time.   Hypertriglyceridemia: Related to alcohol use, most likely. Last triglyceride level was 233. - Resume fenofibrate  Essential hypertension: Uncontrolled initially due to pain.  -  Continue olmesartan and norvasc.  - Hydralazine 10mg  IV prn   Alcohol abuse: And likely alcoholism in the setting of ongoing use despite recurrent negative consequences.  - Briefly discussed his plan for cessation, which he has not formulated. We will touch on this again tomorrow.  - Continuing clidinium/chlordiazepoxide for ?IBS, which may also prevent withdrawal.  - No signs of withdrawal. Continue CIWA. May DC if CIWA remains negative 4/5.  - Thiamine, B12  Fatty liver: Chronic, stable - Secondary to heavy alcohol use. LFTs are at baseline without impaired synthetic function.  GERD: Chronic, stable.  - Continue PPI  DVT prophylaxis: Lovenox Code Status: Full Family Communication: Wife at bedside Disposition Plan: Continue inpatient management of alcoholic pancreatitis. Anticipate DC home when improved.   Consultants:   None  Procedures:   None  Antimicrobials:  None   Subjective: Pt reports pain is improving to the point that he is trying to spread the frequency of dilaudid. Not hungry, but wants water. No nausea or vomiting. Has not formulated plan to stop drinking yet. No chest pain or dyspnea.   Objective: Vitals:   02/27/17 1012 02/27/17 1400 02/27/17 2040 02/28/17 0433  BP: (!) 180/98 139/76 (!) 150/78 (!) 139/96  Pulse: 68 66 74 92  Resp: 18 18 16 14   Temp: 97.7 F (36.5 C) 97.7 F (36.5 C) 97.9 F (36.6 C) 98.3 F (36.8 C)  TempSrc: Oral Oral Oral Oral  SpO2: 98% 95% 96% 98%  Weight: 99.8 kg (220 lb)     Height: 6' (1.829 m)       Intake/Output Summary (Last 24 hours) at 02/28/17  [place] filed at 02/28/17 0659  Gross per 24 hour  Intake          3750.92 ml  Output                0 ml  Net          3750.92 ml   Filed Weights   02/27/17 1012  Weight: 99.8 kg (220 lb)    Examination: General exam: 41 y.o. male in no distress  Respiratory system: Non-labored breathing room air. Clear to auscultation bilaterally.  Cardiovascular  system: Regular rate and rhythm. No murmur, rub, or gallop. No JVD, and no pedal edema. Gastrointestinal system: Abdomen soft, moderate epigastric tenderness, non-distended, with normoactive bowel sounds. No organomegaly or masses felt. Central nervous system: Alert and oriented. No focal neurological deficits. Extremities: Warm, no deformities Skin: No rashes, lesions no ulcers. Tattoo on mid-upper back.  Psychiatry: Judgement and insight appear normal. Mood & affect appropriate.   Data Reviewed: I have personally reviewed following labs and imaging studies  CBC:  Recent Labs Lab 02/27/17 0621 02/27/17 1034  WBC 6.5 10.5  HGB 14.6 14.5  HCT 43.5 43.0  MCV 87.3 87.2  PLT 278 578   Basic Metabolic Panel:  Recent Labs Lab 02/27/17 0621 02/27/17 1034 02/28/17 0703  NA 140  --  137  K 3.8  --  4.0  CL 106  --  102  CO2 26  --  29  GLUCOSE 137*  --  99  BUN 16  --  15  CREATININE 1.14 1.04 1.02  CALCIUM 9.0  --  8.1*   GFR: Estimated Creatinine Clearance: 116.6 mL/min (by C-G formula based on SCr of 1.02 mg/dL). Liver Function Tests:  Recent Labs Lab 02/27/17 0621 02/28/17 0703  AST 48* 31  ALT 73* 53  ALKPHOS 41 34*  BILITOT 0.5 1.2  PROT 7.5 6.7  ALBUMIN 4.3 3.8    Recent Labs Lab 02/27/17 0621 02/28/17 0703  LIPASE 1,047* 370*   No results for input(s): AMMONIA in the last 168 hours. Coagulation Profile: No results for input(s): INR, PROTIME in the last 168 hours. Cardiac Enzymes: No results for input(s): CKTOTAL, CKMB, CKMBINDEX, TROPONINI in the last 168 hours. BNP (last 3 results) No results for input(s): PROBNP in the last 8760 hours. HbA1C: No results for input(s): HGBA1C in the last 72 hours. CBG: No results for input(s): GLUCAP in the last 168 hours. Lipid Profile: No results for input(s): CHOL, HDL, LDLCALC, TRIG, CHOLHDL, LDLDIRECT in the last 72 hours. Thyroid Function Tests: No results for input(s): TSH, T4TOTAL, FREET4, T3FREE,  THYROIDAB in the last 72 hours. Anemia Panel: No results for input(s): VITAMINB12, FOLATE, FERRITIN, TIBC, IRON, RETICCTPCT in the last 72 hours. Urine analysis:    Component Value Date/Time   COLORURINE YELLOW 02/27/2017 0844   APPEARANCEUR CLEAR 02/27/2017 0844   LABSPEC 1.013 02/27/2017 0844   PHURINE 7.0 02/27/2017 0844   GLUCOSEU NEGATIVE 02/27/2017 0844   HGBUR NEGATIVE 02/27/2017 0844   HGBUR negative 02/15/2009 0833   BILIRUBINUR NEGATIVE 02/27/2017 0844   BILIRUBINUR negative 05/11/2016 0825   KETONESUR NEGATIVE 02/27/2017 0844   PROTEINUR NEGATIVE 02/27/2017 0844   UROBILINOGEN 0.2 05/11/2016 0825   UROBILINOGEN 1.0 07/24/2014 0313   NITRITE NEGATIVE 02/27/2017 0844   LEUKOCYTESUR NEGATIVE 02/27/2017 0844   No results found for this or any previous visit (from the past 240 hour(s)).    Radiology Studies: No results found.  Scheduled Meds: . amLODipine  5  mg Oral Daily  . clidinium-chlordiazePOXIDE  1 capsule Oral Daily  . enoxaparin (LOVENOX) injection  40 mg Subcutaneous Daily  . fenofibrate  160 mg Oral Daily  . folic acid  1 mg Oral Daily  . irbesartan  300 mg Oral Daily  . multivitamin with minerals  1 tablet Oral Daily  . pantoprazole  40 mg Oral Daily  . thiamine  100 mg Oral Daily   Or  . thiamine  100 mg Intravenous Daily   Continuous Infusions: . sodium chloride 125 mL/hr at 02/28/17 0844     LOS: 1 day   Time spent: 25 minutes.  Vance Gather, MD [facility] Pager [phone]  If 7PM-7AM, please contact night-coverage [email or web address] Password [facility] 02/28/2017, 10:37 AM

## 2017-03-01 LAB — BASIC METABOLIC PANEL
Anion gap: 9 (ref 5–15)
BUN: 9 mg/dL (ref 6–20)
CALCIUM: 8 mg/dL — AB (ref 8.9–10.3)
CO2: 26 mmol/L (ref 22–32)
CREATININE: 1.07 mg/dL (ref 0.61–1.24)
Chloride: 100 mmol/L — ABNORMAL LOW (ref 101–111)
GFR calc non Af Amer: >60 mL/min (ref >60)
Glucose, Bld: 99 mg/dL (ref 65–99)
Potassium: 3.5 mmol/L (ref 3.5–5.1)
SODIUM: 135 mmol/L (ref 135–145)

## 2017-03-01 MED ORDER — OXYCODONE HCL 5 MG PO TABS
5.0000 mg | ORAL_TABLET | ORAL | 0 refills | Status: DC | PRN
Start: 2017-03-01 — End: 2017-03-07

## 2017-03-01 MED ORDER — OXYCODONE HCL 5 MG PO TABS
5.0000 mg | ORAL_TABLET | ORAL | Status: DC | PRN
  Administered 2017-03-01: 5 mg via ORAL
  Filled 2017-03-01: qty 1

## 2017-03-01 MED ORDER — PANCRELIPASE (LIP-PROT-AMYL) 24000-76000 UNITS PO CPEP: 24000.0000 [IU] | ORAL_CAPSULE | Freq: Three times a day (TID) | ORAL | 0 refills | Status: DC

## 2017-03-01 NOTE — Progress Notes (Signed)
Pt discharged. Educated on discharge instructions; new medications- he verbalized understanding. Pt. Chose to walk out instead of wheelchair

## 2017-03-01 NOTE — Discharge Summary (Signed)
Physician Discharge Summary  Christopher [name] EYC:[id number] DOB: September 28, 1976 DOA: 02/27/2017  PCP: [name], MD  Admit date: 02/27/2017 Discharge date: 03/01/2017  Admitted From: Home Disposition: Home   Recommendations for Outpatient Follow-up:  1. Follow up with PCP in 1-2 weeks 2. Ongoing alcohol cessation counseling 3. Monitor symptoms on creon, started during admission  Home Health: None Equipment/Devices: None Discharge Condition: Stable CODE STATUS: Full Diet recommendation: Low fat as tolerated  Brief/Interim Summary: [name] a 41 y.o.male with a history of alcoholic pancreatitis, ongoing alcohol use, GERD, fatty liver, and hypertriglyceridemia who presented to the ED 4/3 with acute onset of sharp, severe, constant epigastric pain radiating to the back and RUQ for several hours associated with nausea without vomiting, similar to his prior pancreatitis attacks. His wife and he report he had been drinking at a party over the weekend ~9 beers, up from 1 - 2 beers weekly. On arrival, BP was elevated to 175/113 mmHg, remaining vitals were stable. Blood work showed normal CBC and BMET,LFTs showed mildly elevated transaminases (around baseline from his chronic fatty liver). Lipase was markedly elevated >1000. The patient was given 2 L normal saline bolus, IV Dilaudid 1 mg 2 and IV Zofran after which his pain improved to 5/10. Hospitalist admission requested for acute pancreatitis. Pain has improved and diet has been slowly advanced with supplemental lipase. Lipase trended down to 370 the day after admission and was not rechecked. Alcohol cessation counseling was provided and the patient will be discharged with plans to follow up with PCP.   Discharge Diagnoses:  Principal Problem:   Pancreatitis, alcoholic, acute Active Problems:   Hypertriglyceridemia   Essential hypertension   Alcohol abuse   Fatty liver   Acute pancreatitis   Accelerated hypertension  Acute,  recurrent, alcoholic pancreatitis: Lipase showed improvement. Seen by  GI in the past.  - Pain has been controlled with only 1 dose of oxycodone 5mg  po today and tolerated soft diet. Wants to go home. Labs stable.  - Will discharge on oxycodone 5mg  po q8h prn pain #10 and starting creon.   Reported oxycodone allergy: Reports history of itching with this. Has not had these symptoms with oxyIR (avoiding tylenol as able with fatty liver, EtOH use) - Ok to continue oxycodone, will remove as allergy  Hypertriglyceridemia: Related to alcohol use, most likely. Last triglyceride level was 233. - Resume fenofibrate  Essential hypertension: Uncontrolled initially due to pain.  - Continue olmesartan and norvasc, good control.  Alcohol abuse: And likely alcoholism in the setting of ongoing use despite recurrent negative consequences.  - Briefly discussed his plan for cessation. Recommend continuing discussions as outpatient.  - No evidence of withdrawal noted while inpatient.   Fatty liver: Chronic, stable. - Secondary to heavy alcohol use. LFTs are at baseline without impaired synthetic function.  GERD: Chronic, stable.  - Continue PPI  Discharge Instructions Discharge Instructions    Discharge instructions    Complete by:  As directed    You were admitted for acute alcoholic pancreatitis and have improved to the point that you are tolerating per oral intake and pain is controlled without IV medications. You are stable for discharge with the following recommendations:  - Avoid all alcohol.  - Continue taking fluids and food by mouth, taking oxyIR 5mg  as needed for severe pain. Your pain should continue improving, so this will likely not be needed more than 1 - 2 more days.  - Try taking creon (pancreatic enzyme replacement) with meals  to allow your pancreas to heal. This may be an ongoing medications for you, but you should discuss this with Dr. [name].  - Schedule an appointment with  Dr. [name] within the next week.  - If your symptoms worsen, or you are unable to tolerate fluids, please seek medical attention right away.     Allergies as of 03/01/2017      Reactions   Morphine And Related Nausea And Vomiting, Other (See Comments)   Sweats      Medication List    TAKE these medications   amLODipine 5 MG tablet Commonly known as:  NORVASC Take 1 tablet (5 mg total) by mouth daily.   clidinium-chlordiazePOXIDE 5-2.5 MG capsule Commonly known as:  LIBRAX TAKE 1 CAPSULE BY MOUTH  DAILY   fenofibrate 145 MG tablet Commonly known as:  TRICOR TAKE 1 TABLET BY MOUTH  DAILY   ibuprofen 200 MG tablet Commonly known as:  ADVIL,MOTRIN Take 400 mg by mouth every 6 (six) hours as needed for fever, headache, mild pain, moderate pain or cramping.   multivitamin with minerals Tabs tablet Take 1 tablet by mouth daily.   olmesartan 40 MG tablet Commonly known as:  BENICAR TAKE 1 TABLET BY MOUTH  DAILY   oxyCODONE 5 MG immediate release tablet Commonly known as:  Oxy IR/ROXICODONE Take 1 tablet (5 mg total) by mouth every 4 (four) hours as needed for moderate pain or severe pain.   Pancrelipase (Lip-Prot-Amyl) 24000-76000 units Cpep Take 1 capsule (24,000 Units total) by mouth 3 (three) times daily with meals.   pantoprazole 40 MG tablet Commonly known as:  PROTONIX TAKE 1 TABLET BY MOUTH  DAILY   temazepam 15 MG capsule Commonly known as:  RESTORIL Take 1 capsule (15 mg total) by mouth at bedtime as needed for sleep.      Follow-up Information    [name], MD. Schedule an appointment as soon as possible for a visit.   Specialty:  Internal Medicine Contact information: [place] [address] [phone]          Allergies  Allergen Reactions  . Morphine And Related Nausea And Vomiting and Other (See Comments)    Sweats    Consultations: None  Procedures/Studies: None  Subjective: Pt reporting controlled epigastric pain, at  3/10 after lunch, required only 1 dose of oxycodone in the past 12 hours. Wants to go home. Has been walking the halls.  Discharge Exam: BP 125/68 (BP Location: Right Arm)   Pulse 78   Temp 98 F (36.7 C) (Oral)   Resp 16   Ht 6' (1.829 m)   Wt 99.8 kg (220 lb)   SpO2 93%   BMI 29.84 kg/m   General: Pt is alert, awake, not in acute distress Cardiovascular: RRR, S1/S2 +, no rubs, no gallops Respiratory: CTA bilaterally, no wheezing, no rhonchi Abdominal: Soft, very mild epigastric tenderness, ND, bowel sounds + Extremities: No edema, no cyanosis  Labs: Basic Metabolic Panel:  Recent Labs Lab 02/27/17 0621 02/27/17 1034 02/28/17 0703 03/01/17 0349  NA 140  --  137 135  K 3.8  --  4.0 3.5  CL 106  --  102 100*  CO2 26  --  29 26  GLUCOSE 137*  --  99 99  BUN 16  --  15 9  CREATININE 1.14 1.04 1.02 1.07  CALCIUM 9.0  --  8.1* 8.0*   Liver Function Tests:  Recent Labs Lab 02/27/17 3016 02/28/17 0703  AST 48* 31  ALT 73* 53  ALKPHOS 41 34*  BILITOT 0.5 1.2  PROT 7.5 6.7  ALBUMIN 4.3 3.8    Recent Labs Lab 02/27/17 0621 02/28/17 0703  LIPASE 1,047* 370*   CBC:  Recent Labs Lab 02/27/17 0621 02/27/17 1034  WBC 6.5 10.5  HGB 14.6 14.5  HCT 43.5 43.0  MCV 87.3 87.2  PLT 278 278   Urinalysis    Component Value Date/Time   COLORURINE YELLOW 02/27/2017 [place] 02/27/2017 0844   LABSPEC 1.013 02/27/2017 0844   PHURINE 7.0 02/27/2017 0844   GLUCOSEU NEGATIVE 02/27/2017 0844   HGBUR NEGATIVE 02/27/2017 0844   HGBUR negative 02/15/2009 0833   BILIRUBINUR NEGATIVE 02/27/2017 0844   BILIRUBINUR negative 05/11/2016 0825   KETONESUR NEGATIVE 02/27/2017 0844   PROTEINUR NEGATIVE 02/27/2017 0844   UROBILINOGEN 0.2 05/11/2016 0825   UROBILINOGEN 1.0 07/24/2014 0313   NITRITE NEGATIVE 02/27/2017 0844   LEUKOCYTESUR NEGATIVE 02/27/2017 0844   Time coordinating discharge: Approximately 40 minutes  Vance Gather, MD  [facility] 03/01/2017, 12:31 PM Pager [phone]

## 2017-03-07 ENCOUNTER — Ambulatory Visit (INDEPENDENT_AMBULATORY_CARE_PROVIDER_SITE_OTHER): Payer: Commercial Managed Care - HMO | Admitting: Internal Medicine

## 2017-03-07 ENCOUNTER — Encounter: Payer: Self-pay | Admitting: Internal Medicine

## 2017-03-07 VITALS — BP 134/86 | HR 77 | Temp 98.2°F | Resp 16 | Wt 215.0 lb

## 2017-03-07 DIAGNOSIS — K760 Fatty (change of) liver, not elsewhere classified: Secondary | ICD-10-CM

## 2017-03-07 DIAGNOSIS — K852 Alcohol induced acute pancreatitis without necrosis or infection: Secondary | ICD-10-CM | POA: Diagnosis not present

## 2017-03-07 NOTE — Progress Notes (Signed)
Pre visit review using our clinic review tool, if applicable. No additional management support is needed unless otherwise documented below in the visit note. 

## 2017-03-07 NOTE — Assessment & Plan Note (Signed)
Asymptomatic No need for additional blood work Discussed healthy diet, regular exercise and weight loss Stressed abstaining from all alcohol - recommended a support group Taking creon - no side effects - continue discussed possible GI follow up RE long term use of creon and if it is needed and will hold off for now

## 2017-03-07 NOTE — Assessment & Plan Note (Signed)
Discussed importance of weight loss, avoidance of alcohol Discussed increased risk of cirrhosis

## 2017-03-07 NOTE — Patient Instructions (Signed)
   Medications reviewed and updated.  No changes recommended at this time.   Please followup as scheduled

## 2017-03-07 NOTE — Progress Notes (Signed)
Subjective:    Patient ID: Christopher [name], male    DOB: [birth year], 41 y.o.   MRN: [id number]  HPI He is here for follow up from the hospital.  Admitted 02/27/17 - 03/01/17 for alcohol induced pancreatitis.   He went to the ED with several hours of sharp, severe, constant epigastric pain radiating to his back and RUQ. He had associated nausea and vomiting.  He has a history of alcoholic pancreatitis, ongoing alcohol use, GERD, fatty liver and hypertriglyceridemia. He did drink at a party the weekend prior, about 9 beers.    In the ED his BP was 175/113, LFTs mildly elevated but near baseline, lipase was > 1000.   He received IVF, dilaudid, zofran.  He was admitted and pain improved.  Diet was slowly advanced.  Lipases trended down to 370.  Alcohol cessation counseling was provided.    He was discharged with oxycodone 5 mg Q 8 hr and was started on creon.  He was continued on his fenofibrate.     He is still taking the creon and tolerating it well.  He wonders if he needs to continue it.  He wants to know what dietary changes he needs to make.    He denies any pain at this point.  He was tired initially but that has improved.  His appetite is good.  He is trying to eat more frequently and healthier. He knows he needs to abstain from alcohol, but knows that will be hard.  He is going [place] an all inclusive [place] in a [place]   Medications and allergies reviewed with patient and updated if appropriate.  Patient Active Problem List   Diagnosis Date Noted  . Acute pancreatitis 02/27/2017  . Pancreatitis, alcoholic, acute 78/29/5621  . Accelerated hypertension 02/27/2017  . History of pancreatitis 02/16/2017  . Skin lesions 08/06/2015  . Renal mass, right 07/31/2014  . Alcohol abuse 07/22/2014  . Fatty liver 07/22/2014  . Equinus deformity of foot, acquired 03/02/2014  . 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 ALLERGIC RHINITIS DUE TO POLLEN 02/14/2010  . TRANSAMINASES,  SERUM, ELEVATED 05/19/2009  . Essential hypertension 03/09/2008  . Hypertriglyceridemia 08/16/2007  . GERD 08/16/2007    Current Outpatient Prescriptions on File Prior to Visit  Medication Sig Dispense Refill  . amLODipine (NORVASC) 5 MG tablet Take 1 tablet (5 mg total) by mouth daily. 90 tablet 3  . clidinium-chlordiazePOXIDE (LIBRAX) 5-2.5 MG capsule TAKE 1 CAPSULE BY MOUTH  DAILY 90 capsule 1  . fenofibrate (TRICOR) 145 MG tablet TAKE 1 TABLET BY MOUTH  DAILY 90 tablet 1  . ibuprofen (ADVIL,MOTRIN) 200 MG tablet Take 400 mg by mouth every 6 (six) hours as needed for fever, headache, mild pain, moderate pain or cramping.    . lipase/protease/amylase 24000-76000 units CPEP Take 1 capsule (24,000 Units total) by mouth 3 (three) times daily with meals. 90 capsule 0  . Multiple Vitamin (MULTIVITAMIN WITH MINERALS) TABS tablet Take 1 tablet by mouth daily.    Marland Kitchen olmesartan (BENICAR) 40 MG tablet TAKE 1 TABLET BY MOUTH  DAILY 90 tablet 1  . oxyCODONE (OXY IR/ROXICODONE) 5 MG immediate release tablet Take 1 tablet (5 mg total) by mouth every 4 (four) hours as needed for moderate pain or severe pain. 10 tablet 0  . pantoprazole (PROTONIX) 40 MG tablet TAKE 1 TABLET BY MOUTH  DAILY 90 tablet 1  . temazepam (RESTORIL) 15 MG capsule Take 1 capsule (15 mg total) by  mouth at bedtime as needed for sleep. 90 capsule 1   No current facility-administered medications on file prior to visit.     Past Medical History:  Diagnosis Date  . GERD (gastroesophageal reflux disease)   . H/O hiatal hernia   . Hepatic steatosis   . Hyperlipidemia   . Hypertension   . IBS (irritable bowel syndrome)   . Kidney cyst, acquired   . Migraine    "probably monthly" (07/22/2014)  . 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 Pain in lower limb 03/02/2014  . Pancreatitis     Past Surgical History:  Procedure Laterality Date  . CARPAL TUNNEL RELEASE Right 2008  . COLONOSCOPY     IBS  . CYST EXCISION Right 2015   "2nd toe"  . KNEE  ARTHROSCOPY Right 1992  . LASIK    . REFRACTIVE SURGERY Bilateral 2013  . SHOULDER SURGERY  10/2016  . UPPER GASTROINTESTINAL ENDOSCOPY    . UPPER GI ENDOSCOPY     chest pain  . WRIST FRACTURE SURGERY Right 2008   "got plate and screws"    Social History   Social History  . Marital status: Married    Spouse name: N/A  . Number of children: 2  . Years of education: N/A   Social History Main Topics  . Smoking status: Never Smoker  . Smokeless tobacco: Never Used  . Alcohol use Yes  . Drug use: No  . Sexual activity: Yes   Other Topics Concern  . 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 Hypertension Mother   . Hyperlipidemia Mother   . Hypertension Father   . Alzheimer's disease Maternal Grandmother   . Alcohol abuse Maternal Grandfather   . Alzheimer's disease Paternal Grandmother   . Alcohol abuse Paternal Grandfather   . Colon cancer Neg Hx     Review of Systems  Constitutional: Negative for fever.  Respiratory: Negative for shortness of breath and wheezing.   Cardiovascular: Positive for palpitations (transient episode once). Negative for chest pain and leg swelling.  Gastrointestinal: Negative for abdominal pain, blood in stool, diarrhea and nausea.  Neurological: Positive for light-headedness (occasional with standing) and headaches. Negative for dizziness.       Objective:   Vitals:   03/07/17 0809  BP: 134/86  Pulse: 77  Resp: 16  Temp: 98.2 F (36.8 C)   Filed Weights   03/07/17 0809  Weight: 215 lb (97.5 kg)   Body mass index is 29.16 kg/m.  Wt Readings from Last 3 Encounters:  03/07/17 215 lb (97.5 kg)  02/27/17 220 lb (99.8 kg)  02/16/17 220 lb (99.8 kg)     Physical Exam Constitutional: Appears well-developed and  well-nourished. No distress.  HENT:  Head: Normocephalic and atraumatic.  Neck: Neck supple. No tracheal deviation present. No thyromegaly present.  No cervical lymphadenopathy Cardiovascular: Normal rate, regular rhythm and normal heart sounds.   No murmur heard. No edema Pulmonary/Chest: Effort normal and breath sounds normal. No respiratory distress. No has no wheezes. No rales.  Abdomen:  Soft, non tender, non distended, no HSM Skin: Skin is warm and dry. Not diaphoretic.  Psychiatric: Normal mood and affect. Behavior is normal.         Assessment &  Plan:   See Problem List for Assessment and Plan of chronic medical problems.

## 2017-05-05 IMAGING — DX DG ABDOMEN ACUTE W/ 1V CHEST
3 series · 3 of 3 positions shown · non-contrast
Comparison: None.

CLINICAL DATA: Diarrhea starting last night, abdominal pain and
emesis starting today.

EXAM:
DG ABDOMEN ACUTE W/ 1V CHEST

[w chest pa]
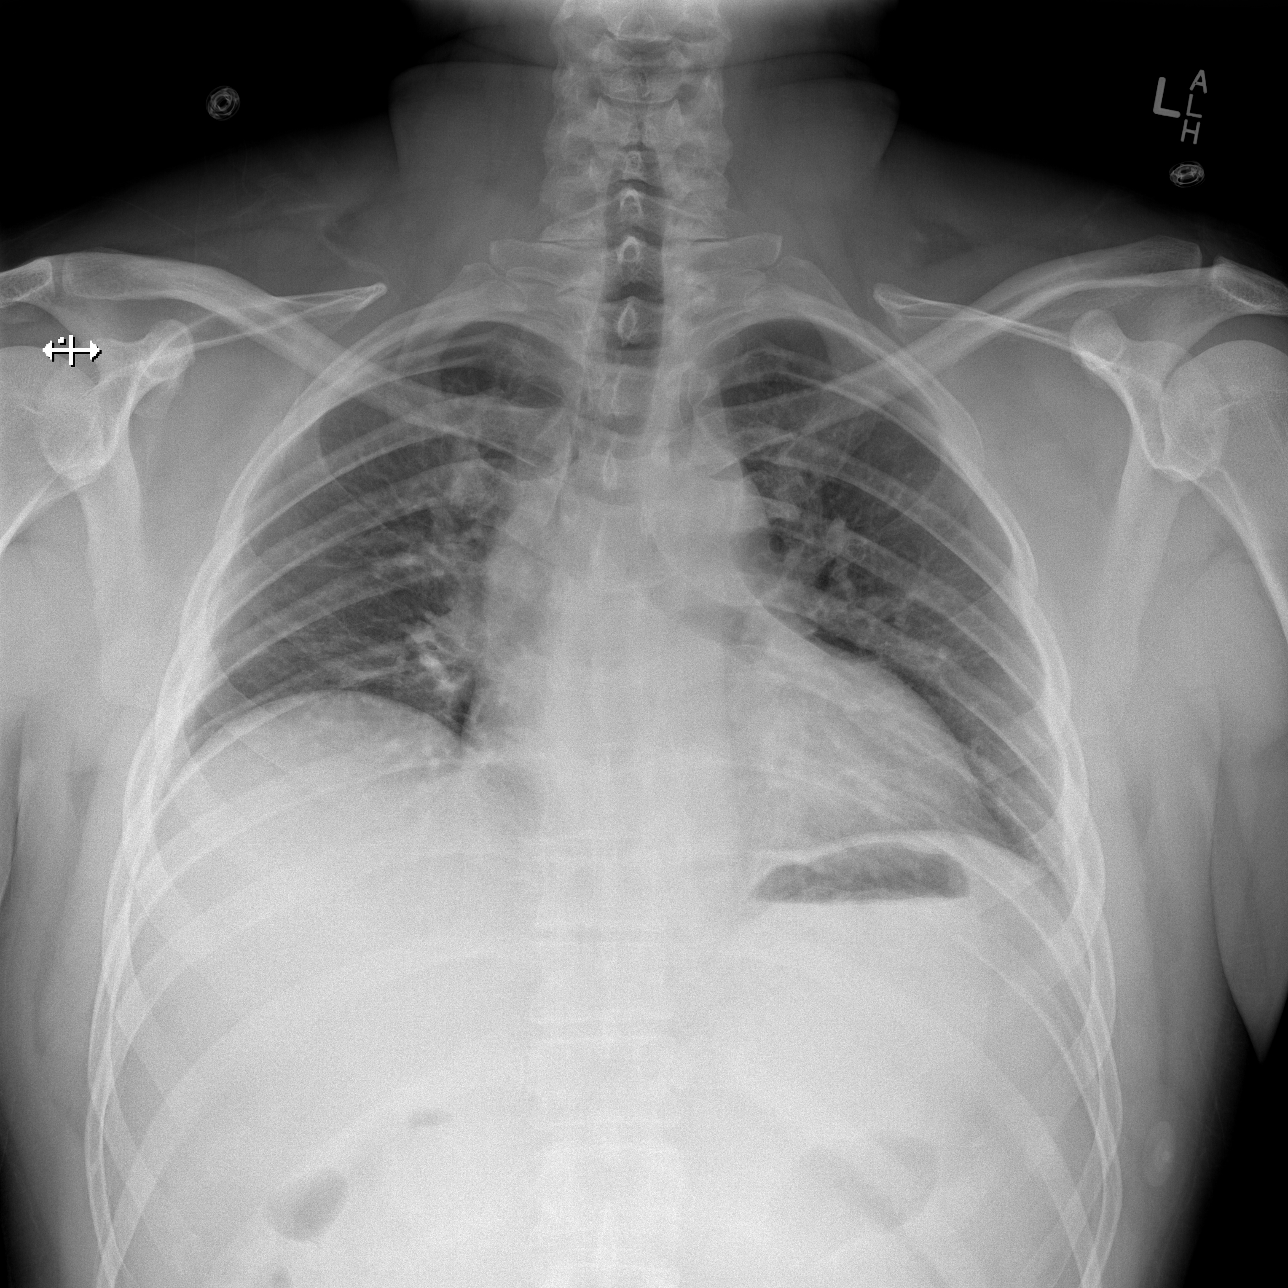

[w abdomen upright]
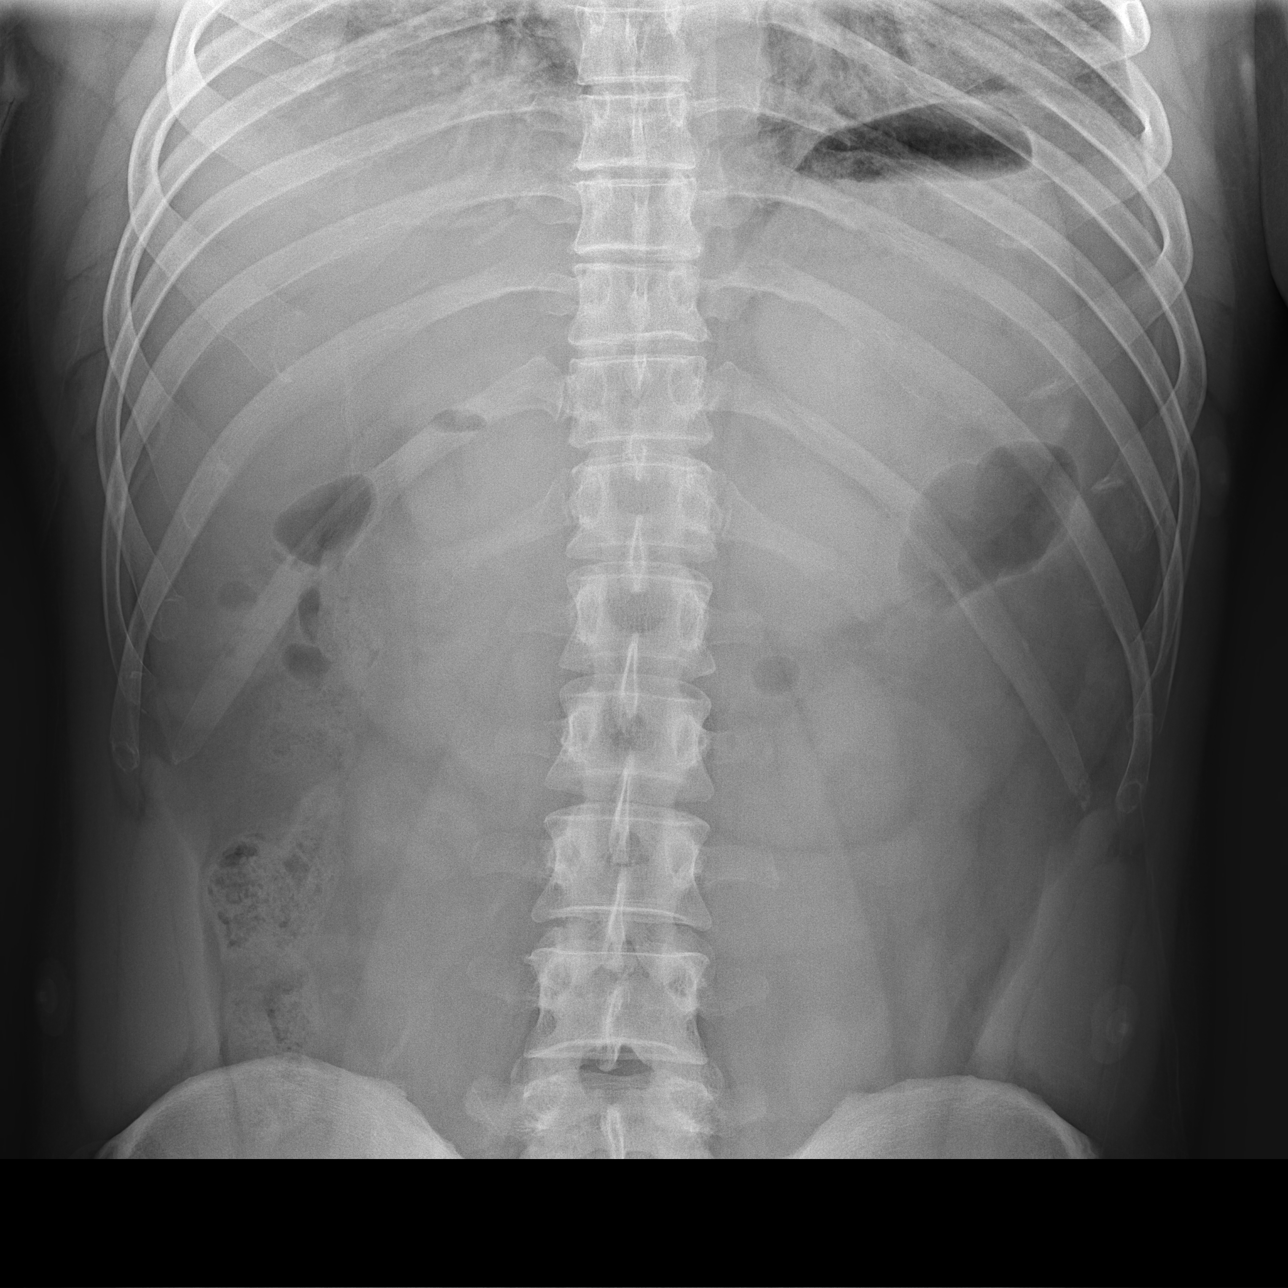

[t abdomen supine]
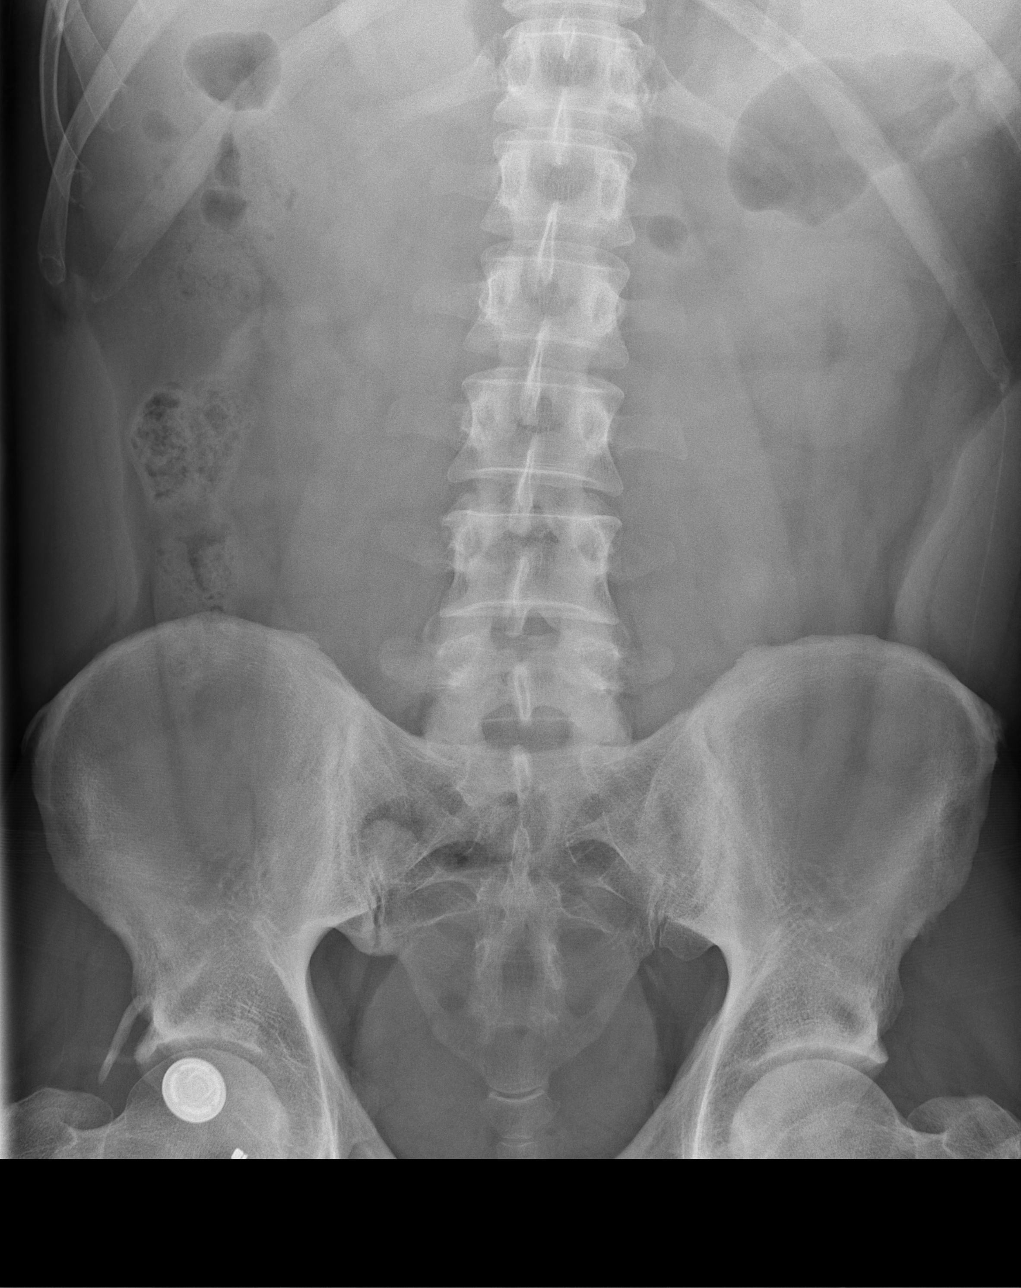

[3 of 3 positions shown; findings below may reference images not displayed]

FINDINGS: Low lung volumes are present, causing crowding of the pulmonary
vasculature.

No free intraperitoneal gas beneath the hemidiaphragms.

Small amount of gas and stool in the ascending colon. Most of the
abdomen is gasless.
IMPRESSION: 1. The small bowel is gasless there is only a small amount of gas
and stool in the colon. No abnormality is seen but the absence of
gas within the bowel reduces negative predictive value.
2. Low lung volumes.

## 2017-05-14 ENCOUNTER — Other Ambulatory Visit: Payer: Self-pay | Admitting: Family Medicine

## 2017-06-04 ENCOUNTER — Ambulatory Visit: Payer: Commercial Managed Care - HMO | Admitting: Internal Medicine

## 2017-08-14 DIAGNOSIS — M79672 Pain in left foot: Secondary | ICD-10-CM | POA: Diagnosis not present

## 2017-08-14 DIAGNOSIS — M25572 Pain in left ankle and joints of left foot: Secondary | ICD-10-CM | POA: Diagnosis not present

## 2017-08-22 NOTE — Progress Notes (Addendum)
Subjective:    Patient ID: Christopher [name], male    DOB: 03-09-1976, 41 y.o.   MRN: [id number]  HPI The patient is here for follow up.  Hypertension: He is taking his medication daily. He is compliant with a low sodium diet.  He denies chest pain, palpitations, edema, shortness of breath and regular headaches. He is exercising  - walking some.     GERD:  He is taking his medication daily as prescribed.  He denies any GERD symptoms regularly and feels his GERD is well controlled. He occasional has GERD especially if he drinks soda, which he is trying to decrease.    Hyperlipidemia: He is taking his medication daily. He is compliant with a low fat/cholesterol diet. He is exercising  - walking some. He denies myalgias.   Alcohol abuse, h/o pancreatitis:  He has decreased the alcohol use - he typically only drinks one night on the weekend, but will drink 5-6 drinks at that time.  He denies any symptoms suggestive of pancreatitis.    ED:  He has been experiencing some erectile dysfunction and wonders what the cause may be.     Medications and allergies reviewed with patient and updated if appropriate.  Patient Active Problem List   Diagnosis Date Noted  . Acute pancreatitis 02/27/2017  . Pancreatitis, alcoholic, acute 31/54/0086  . Accelerated hypertension 02/27/2017  . History of pancreatitis 02/16/2017  . Skin lesions 08/06/2015  . Renal mass, right 07/31/2014  . Alcohol abuse 07/22/2014  . Fatty liver 07/22/2014  . Equinus deformity of foot, acquired 03/02/2014  . 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 ALLERGIC RHINITIS DUE TO POLLEN 02/14/2010  . TRANSAMINASES, SERUM, ELEVATED 05/19/2009  . Essential hypertension 03/09/2008  . Hypertriglyceridemia 08/16/2007  . GERD 08/16/2007    Current Outpatient Prescriptions on File Prior to Visit  Medication Sig Dispense Refill  . amLODipine (NORVASC) 5 MG tablet Take 1 tablet (5 mg total) by mouth daily. 90 tablet 3  .  clidinium-chlordiazePOXIDE (LIBRAX) 5-2.5 MG capsule TAKE 1 CAPSULE BY MOUTH  DAILY 90 capsule 1  . fenofibrate (TRICOR) 145 MG tablet TAKE 1 TABLET BY MOUTH  DAILY 90 tablet 1  . ibuprofen (ADVIL,MOTRIN) 200 MG tablet Take 400 mg by mouth every 6 (six) hours as needed for fever, headache, mild pain, moderate pain or cramping.    . lipase/protease/amylase 24000-76000 units CPEP Take 1 capsule (24,000 Units total) by mouth 3 (three) times daily with meals. 90 capsule 0  . Multiple Vitamin (MULTIVITAMIN WITH MINERALS) TABS tablet Take 1 tablet by mouth daily.    Marland Kitchen olmesartan (BENICAR) 40 MG tablet TAKE 1 TABLET BY MOUTH  DAILY 90 tablet 1  . pantoprazole (PROTONIX) 40 MG tablet TAKE 1 TABLET BY MOUTH  DAILY 90 tablet 1  . temazepam (RESTORIL) 15 MG capsule Take 1 capsule (15 mg total) by mouth at bedtime as needed for sleep. 90 capsule 1   No current facility-administered medications on file prior to visit.     Past Medical History:  Diagnosis Date  . GERD (gastroesophageal reflux disease)   . H/O hiatal hernia   . Hepatic steatosis   . Hyperlipidemia   . Hypertension   . IBS (irritable bowel syndrome)   . Kidney cyst, acquired   . Migraine    "probably monthly" (07/22/2014)  . 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 Pain in lower limb 03/02/2014  . Pancreatitis     Past Surgical History:  Procedure Laterality Date  . CARPAL TUNNEL  RELEASE Right 2008  . COLONOSCOPY     IBS  . CYST EXCISION Right 2015   "2nd toe"  . KNEE ARTHROSCOPY Right 1992  . LASIK    . REFRACTIVE SURGERY Bilateral 2013  . SHOULDER SURGERY  10/2016  . UPPER GASTROINTESTINAL ENDOSCOPY    . UPPER GI ENDOSCOPY     chest pain  . WRIST FRACTURE SURGERY Right 2008   "got plate and screws"    Social History   Social History  . Marital status: Married    Spouse name: N/A  . Number of children: 2  . Years of education: N/A   Social History Main Topics  . Smoking status: Never Smoker  . Smokeless tobacco: Never Used  .  Alcohol use Yes  . Drug use: No  . Sexual activity: Yes   Other Topics Concern  . 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 Hypertension Mother   . Hyperlipidemia Mother   . Hypertension Father   . Alzheimer's disease Maternal Grandmother   . Alcohol abuse Maternal Grandfather   . Alzheimer's disease Paternal Grandmother   . Alcohol abuse Paternal Grandfather   . Colon cancer Neg Hx     Review of Systems  Constitutional: Negative for chills and fever.  Respiratory: Negative for cough, shortness of breath and wheezing.   Cardiovascular: Negative for chest pain, palpitations and leg swelling.  Neurological: Positive for headaches (occ). Negative for light-headedness.       Objective:   Vitals:   08/23/17 0824  BP: (!) 140/94  Pulse: (!) 55  Resp: 16  Temp: 97.9 F (36.6 C)  SpO2: 99%   Wt Readings from Last 3 Encounters:  08/23/17 216 lb (98 kg)  03/07/17 215 lb (97.5 kg)  02/27/17 220 lb (99.8 kg)   Body mass index is 29.29 kg/m.   Physical Exam    Constitutional: Appears well-developed and well-nourished. No distress.  HENT:  Head: Normocephalic and atraumatic.  Neck: Neck supple. No tracheal deviation present. No thyromegaly present.  No cervical lymphadenopathy Cardiovascular: Normal rate, regular rhythm and normal heart sounds.   No murmur heard. No carotid bruit .  No edema Pulmonary/Chest: Effort normal and breath sounds normal. No respiratory distress. No has no wheezes. No rales.  Skin: Skin is warm and dry. Not diaphoretic.  Psychiatric: Normal mood and affect. Behavior is normal.      Assessment & Plan:    See Problem List for Assessment and Plan of chronic medical problems.

## 2017-08-23 ENCOUNTER — Other Ambulatory Visit (INDEPENDENT_AMBULATORY_CARE_PROVIDER_SITE_OTHER): Payer: 59

## 2017-08-23 ENCOUNTER — Encounter: Payer: Self-pay | Admitting: Internal Medicine

## 2017-08-23 ENCOUNTER — Ambulatory Visit (INDEPENDENT_AMBULATORY_CARE_PROVIDER_SITE_OTHER): Payer: 59 | Admitting: Internal Medicine

## 2017-08-23 VITALS — BP 140/94 | HR 55 | Temp 97.9°F | Resp 16 | Wt 216.0 lb

## 2017-08-23 DIAGNOSIS — Z23 Encounter for immunization: Secondary | ICD-10-CM | POA: Diagnosis not present

## 2017-08-23 DIAGNOSIS — I10 Essential (primary) hypertension: Secondary | ICD-10-CM

## 2017-08-23 DIAGNOSIS — Z8739 Personal history of other diseases of the musculoskeletal system and connective tissue: Secondary | ICD-10-CM | POA: Diagnosis not present

## 2017-08-23 DIAGNOSIS — E781 Pure hyperglyceridemia: Secondary | ICD-10-CM

## 2017-08-23 DIAGNOSIS — K219 Gastro-esophageal reflux disease without esophagitis: Secondary | ICD-10-CM | POA: Diagnosis not present

## 2017-08-23 DIAGNOSIS — N529 Male erectile dysfunction, unspecified: Secondary | ICD-10-CM | POA: Diagnosis not present

## 2017-08-23 DIAGNOSIS — F1010 Alcohol abuse, uncomplicated: Secondary | ICD-10-CM | POA: Diagnosis not present

## 2017-08-23 LAB — CBC WITH DIFFERENTIAL/PLATELET
BASOS ABS: 0 10*3/uL (ref 0.0–0.1)
BASOS PCT: 0.5 % (ref 0.0–3.0)
EOS ABS: 0.2 10*3/uL (ref 0.0–0.7)
Eosinophils Relative: 3.3 % (ref 0.0–5.0)
HCT: 41.5 % (ref 39.0–52.0)
Hemoglobin: 13.9 g/dL (ref 13.0–17.0)
LYMPHS ABS: 1.1 10*3/uL (ref 0.7–4.0)
Lymphocytes Relative: 19.7 % (ref 12.0–46.0)
MCHC: 33.4 g/dL (ref 30.0–36.0)
MCV: 90 fl (ref 78.0–100.0)
Monocytes Absolute: 0.6 10*3/uL (ref 0.1–1.0)
Monocytes Relative: 10.8 % (ref 3.0–12.0)
NEUTROS ABS: 3.7 10*3/uL (ref 1.4–7.7)
NEUTROS PCT: 65.7 % (ref 43.0–77.0)
PLATELETS: 330 10*3/uL (ref 150.0–400.0)
RBC: 4.62 Mil/uL (ref 4.22–5.81)
RDW: 12.6 % (ref 11.5–15.5)
WBC: 5.7 10*3/uL (ref 4.0–10.5)

## 2017-08-23 LAB — COMPREHENSIVE METABOLIC PANEL
ALK PHOS: 34 U/L — AB (ref 39–117)
ALT: 56 U/L — AB (ref 0–53)
AST: 34 U/L (ref 0–37)
Albumin: 4.3 g/dL (ref 3.5–5.2)
BILIRUBIN TOTAL: 0.7 mg/dL (ref 0.2–1.2)
BUN: 15 mg/dL (ref 6–23)
CO2: 28 meq/L (ref 19–32)
CREATININE: 1.05 mg/dL (ref 0.40–1.50)
Calcium: 9.3 mg/dL (ref 8.4–10.5)
Chloride: 103 mEq/L (ref 96–112)
GFR: 82.35 mL/min (ref >60.00)
GLUCOSE: 95 mg/dL (ref 70–99)
Potassium: 4.1 mEq/L (ref 3.5–5.1)
SODIUM: 140 meq/L (ref 135–145)
TOTAL PROTEIN: 7.2 g/dL (ref 6.0–8.3)

## 2017-08-23 LAB — HEMOGLOBIN A1C: Hgb A1c MFr Bld: 5.5 % (ref 4.6–6.5)

## 2017-08-23 LAB — LIPID PANEL
CHOL/HDL RATIO: 4
Cholesterol: 156 mg/dL (ref 0–200)
HDL: 43.3 mg/dL (ref >39.00)
LDL CALC: 90 mg/dL (ref 0–99)
NonHDL: 112.34
TRIGLYCERIDES: 110 mg/dL (ref 0.0–149.0)
VLDL: 22 mg/dL (ref 0.0–40.0)

## 2017-08-23 LAB — URIC ACID: Uric Acid, Serum: 6.9 mg/dL (ref 4.0–7.8)

## 2017-08-23 MED ORDER — LOSARTAN POTASSIUM 100 MG PO TABS: 100.0000 mg | ORAL_TABLET | Freq: Every day | ORAL | 1 refills | Status: DC

## 2017-08-23 NOTE — Patient Instructions (Addendum)
  Test(s) ordered today. Your results will be released to [place] (or called to you) after review, usually within 72hours after test completion. If any changes need to be made, you will be notified at that same time.  All other Health Maintenance issues reviewed.   All recommended immunizations and age-appropriate screenings are up-to-date or discussed.  Flu immunization administered today.   Medications reviewed and updated.  Changes include stopping the olmesartan and starting losartan 100 mg daily.   Your prescription(s) have been submitted to your pharmacy. Please take as directed and contact our office if you believe you are having problem(s) with the medication(s).   Please followup in 53months

## 2017-08-23 NOTE — Assessment & Plan Note (Signed)
Has had at least one episode - probably more than that Discussed causes Check uric acid level

## 2017-08-23 NOTE — Assessment & Plan Note (Signed)
GERD controlled Continue daily medication Discussed ideally we want him to get off this medication - discussed potential long term side effects Work on healthy diet/gerd diet and weight loss

## 2017-08-23 NOTE — Addendum Note (Signed)
Addended by: [name] on: 08/23/2017 08:49 AM   Modules accepted: Orders

## 2017-08-23 NOTE — Assessment & Plan Note (Signed)
Discussed possible causes Advised not drinking alcohol Discussed healthy lifestyle and weight loss Deferred urology referral

## 2017-08-23 NOTE — Assessment & Plan Note (Signed)
Did not take meds today -likely controlled Continue two medications - will change olmesartan to losartan for cost purposes Continue amlodipine Stressed low sodium diet, avoidance of alcohol, regular exercise and weight loss cmp

## 2017-08-23 NOTE — Assessment & Plan Note (Signed)
Check lipid panel  Continue daily fenofibrate Regular exercise and healthy diet encouraged Work on weight loss Stop drinking alcohol

## 2017-08-23 NOTE — Assessment & Plan Note (Signed)
Has decreased use, but still binge drinking Advised alcohol abstinence for multiple reasons

## 2017-08-25 ENCOUNTER — Encounter: Payer: Self-pay | Admitting: Internal Medicine

## 2017-10-21 DIAGNOSIS — M10072 Idiopathic gout, left ankle and foot: Secondary | ICD-10-CM | POA: Diagnosis not present

## 2017-11-15 ENCOUNTER — Encounter: Payer: Self-pay | Admitting: Internal Medicine

## 2017-11-15 MED ORDER — PREDNISONE 10 MG PO TABS: ORAL_TABLET | ORAL | 0 refills | Status: DC

## 2017-11-18 MED ORDER — PREDNISONE 10 MG PO TABS: ORAL_TABLET | ORAL | 0 refills | Status: DC

## 2017-11-18 NOTE — Addendum Note (Signed)
Addended by: [name] on: 11/18/2017 08:35 AM   Modules accepted: Orders

## 2017-12-01 NOTE — Progress Notes (Signed)
Subjective:    Patient ID: Christopher [name], male    DOB: 09-07-1976, 42 y.o.   MRN: [id number]  HPI He is here for a physical exam.   He has been having some issues with his wife.  He has difficulty sleeping at times.  He has "funks" where he does feel down and he is not pleasurable to be around.  This can last 10-15 days.  He thinks he was on something in the past about 15 years ago.  His mom and sister are on medication and he has a family history of depression.  He thinks he may need to start a medication for depression.  He feels anxious recently due to his marriage issues.    He has cut back on his drinking.  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  He did not take the prednisone recently that I prescribed for gout.  It went away after a couple of days with taking a couple of doses of tart cherry juice.     Medications and allergies reviewed with patient and updated if appropriate.  Patient Active Problem List   Diagnosis Date Noted  . History of gout 08/23/2017  . Erectile dysfunction 08/23/2017  . Acute pancreatitis 02/27/2017  . Pancreatitis, alcoholic, acute 18/29/9371  . History of pancreatitis 02/16/2017  . Skin lesions 08/06/2015  . Renal mass, right 07/31/2014  . Alcohol abuse 07/22/2014  . Fatty liver 07/22/2014  . Equinus deformity of foot, acquired 03/02/2014  . 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 ALLERGIC RHINITIS DUE TO POLLEN 02/14/2010  . TRANSAMINASES, SERUM, ELEVATED 05/19/2009  . Essential hypertension 03/09/2008  . Hypertriglyceridemia 08/16/2007  . GERD 08/16/2007    Current Outpatient Medications on File Prior to Visit  Medication Sig Dispense Refill  . amLODipine (NORVASC) 5 MG tablet Take 1 tablet (5 mg total) by mouth daily. 90 tablet 3  . clidinium-chlordiazePOXIDE (LIBRAX) 5-2.5 MG capsule TAKE 1 CAPSULE BY MOUTH  DAILY 90 capsule 1  . fenofibrate (TRICOR) 145 MG tablet TAKE 1 TABLET BY MOUTH  DAILY 90 tablet 1  . losartan  (COZAAR) 100 MG tablet Take 1 tablet (100 mg total) by mouth daily. 90 tablet 1  . Multiple Vitamin (MULTIVITAMIN WITH MINERALS) TABS tablet Take 1 tablet by mouth daily.    . pantoprazole (PROTONIX) 40 MG tablet TAKE 1 TABLET BY MOUTH  DAILY 90 tablet 1   No current facility-administered medications on file prior to visit.     Past Medical History:  Diagnosis Date  . GERD (gastroesophageal reflux disease)   . H/O hiatal hernia   . Hepatic steatosis   . Hyperlipidemia   . Hypertension   . IBS (irritable bowel syndrome)   . Kidney cyst, acquired   . Migraine    "probably monthly" (07/22/2014)  . 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 Pain in lower limb 03/02/2014  . Pancreatitis     Past Surgical History:  Procedure Laterality Date  . CARPAL TUNNEL RELEASE Right 2008  . COLONOSCOPY     IBS  . CYST EXCISION Right 2015   "2nd toe"  . KNEE ARTHROSCOPY Right 1992  . LASIK    . REFRACTIVE SURGERY Bilateral 2013  . SHOULDER SURGERY  10/2016  . UPPER GASTROINTESTINAL ENDOSCOPY    . UPPER GI ENDOSCOPY     chest pain  . WRIST FRACTURE SURGERY Right 2008   "got plate and screws"    Social History   Socioeconomic  History  . Marital status: Married    Spouse name: None  . Number of children: 2  . Years of education: None  . Highest education level: None  Social Needs  . Financial resource strain: None  . Food insecurity - worry: None  . Food insecurity - inability: None  . Transportation needs - medical: None  . Transportation needs - non-medical: None  Occupational History  . None  Tobacco Use  . Smoking status: Never Smoker  . Smokeless tobacco: Never Used  Substance and Sexual Activity  . Alcohol use: Yes    Comment: 5-6 drinks once a week  . Drug use: No  . Sexual activity: Yes  Other Topics Concern  . 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 Hypertension Mother   . Hyperlipidemia Mother   . Hypertension Father   . Alzheimer's disease Maternal Grandmother   . Alcohol abuse Maternal Grandfather   . Alzheimer's disease Paternal Grandmother   . Alcohol abuse Paternal Grandfather   . Colon cancer Neg Hx     Review of Systems  Constitutional: Negative for chills and fever.  Eyes: Negative for visual disturbance.  Respiratory: Negative for cough and shortness of breath.   Cardiovascular: Positive for palpitations (occasional). Negative for chest pain and leg swelling.  Gastrointestinal: Positive for constipation and diarrhea (with IBS). Negative for abdominal pain, blood in stool and nausea.  Genitourinary: Negative for difficulty urinating, dysuria and hematuria.  Musculoskeletal: Negative for arthralgias, back pain and myalgias.  Skin: Negative for color change and rash.  Neurological: Negative for light-headedness and headaches.  Psychiatric/Behavioral: Positive for dysphoric mood and sleep disturbance. Negative for self-injury and suicidal ideas (no homicidal thoughts). The patient is nervous/anxious (more recently).        Objective:   Vitals:   12/03/17 0802  BP: 134/86  Pulse: 62  Resp: 16  Temp: 97.7 F (36.5 C)  SpO2: 98%   Filed Weights   12/03/17 0802  Weight: 213 lb (96.6 kg)   Body mass index is 28.89 kg/m.  Wt Readings from Last 3 Encounters:  12/03/17 213 lb (96.6 kg)  08/23/17 216 lb (98 kg)  03/07/17 215 lb (97.5 kg)     Physical Exam Constitutional: He appears well-developed and well-nourished. No distress.  HENT:  Head: Normocephalic and atraumatic.  Right Ear: External ear normal.  Left Ear: External ear normal.  Mouth/Throat: Oropharynx is clear and moist.  Normal ear canals and TM b/l  Eyes: Conjunctivae and EOM are normal.  Neck: Neck supple. No tracheal deviation present. No thyromegaly present.  No carotid  bruit  Cardiovascular: Normal rate, regular rhythm, normal heart sounds and intact distal pulses.   No murmur heard. Pulmonary/Chest: Effort normal and breath sounds normal. No respiratory distress. He has no wheezes. He has no rales.  Abdominal: Soft. He exhibits no distension. There is no tenderness.  Genitourinary: deferred  Musculoskeletal: He exhibits no edema.  Lymphadenopathy:   He has no cervical adenopathy.  Skin: Skin is warm and dry. He is not diaphoretic.  Psychiatric: He has a normal mood and affect. His behavior is normal.         Assessment & Plan:   Physical exam: Screening blood work  reviewed blood work  from 07/2017 Immunizations  Up to date  Exercise  No regular exercise Weight  - overweight -- encouraged weight loss Skin   No concerns - picks at his skin, but that is self induced Substance abuse - drinking about once a week, but binges   See Problem List for Assessment and Plan of chronic medical problems.   FU in 3 months

## 2017-12-01 NOTE — Patient Instructions (Addendum)
All other Health Maintenance issues reviewed.   All recommended immunizations and age-appropriate screenings are up-to-date or discussed.  No immunizations administered today.   Medications reviewed and updated.  Changes include starting sertraline 50 mg nightly.  Your prescription(s) have been submitted to your pharmacy. Please take as directed and contact our office if you believe you are having problem(s) with the medication(s).   Please followup in 3 months    Health Maintenance, Male A healthy lifestyle and preventive care is important for your health and wellness. Ask your health care provider about what schedule of regular examinations is right for you. What should I know about weight and diet? Eat a Healthy Diet  Eat plenty of vegetables, fruits, whole grains, low-fat dairy products, and lean protein.  Do not eat a lot of foods high in solid fats, added sugars, or salt.  Maintain a Healthy Weight Regular exercise can help you achieve or maintain a healthy weight. You should:  Do at least 150 minutes of exercise each week. The exercise should increase your heart rate and make you sweat (moderate-intensity exercise).  Do strength-training exercises at least twice a week.  Watch Your Levels of Cholesterol and Blood Lipids  Have your blood tested for lipids and cholesterol every 5 years starting at 43 years of age. If you are at high risk for heart disease, you should start having your blood tested when you are 43 years old. You may need to have your cholesterol levels checked more often if: ? Your lipid or cholesterol levels are high. ? You are older than 43 years of age. ? You are at high risk for heart disease.  What should I know about cancer screening? Many types of cancers can be detected early and may often be prevented. Lung Cancer  You should be screened every year for lung cancer if: ? You are a current smoker who has smoked for at least 30 years. ? You are a  former smoker who has quit within the past 15 years.  Talk to your health care provider about your screening options, when you should start screening, and how often you should be screened.  Colorectal Cancer  Routine colorectal cancer screening usually begins at 43 years of age and should be repeated every 5-10 years until you are 43 years old. You may need to be screened more often if early forms of precancerous polyps or small growths are found. Your health care provider may recommend screening at an earlier age if you have risk factors for colon cancer.  Your health care provider may recommend using home test kits to check for hidden blood in the stool.  A small camera at the end of a tube can be used to examine your colon (sigmoidoscopy or colonoscopy). This checks for the earliest forms of colorectal cancer.  Prostate and Testicular Cancer  Depending on your age and overall health, your health care provider may do certain tests to screen for prostate and testicular cancer.  Talk to your health care provider about any symptoms or concerns you have about testicular or prostate cancer.  Skin Cancer  Check your skin from head to toe regularly.  Tell your health care provider about any new moles or changes in moles, especially if: ? There is a change in a mole's size, shape, or color. ? You have a mole that is larger than a pencil eraser.  Always use sunscreen. Apply sunscreen liberally and repeat throughout the day.  Protect yourself by wearing  long sleeves, pants, a wide-brimmed hat, and sunglasses when outside.  What should I know about heart disease, diabetes, and high blood pressure?  If you are 9-71 years of age, have your blood pressure checked every 3-5 years. If you are 93 years of age or older, have your blood pressure checked every year. You should have your blood pressure measured twice-once when you are at a hospital or clinic, and once when you are not at a hospital or  clinic. Record the average of the two measurements. To check your blood pressure when you are not at a hospital or clinic, you can use: ? An automated blood pressure machine at a pharmacy. ? A home blood pressure monitor.  Talk to your health care provider about your target blood pressure.  If you are between 39-65 years old, ask your health care provider if you should take aspirin to prevent heart disease.  Have regular diabetes screenings by checking your fasting blood sugar level. ? If you are at a normal weight and have a low risk for diabetes, have this test once every three years after the age of 18. ? If you are overweight and have a high risk for diabetes, consider being tested at a younger age or more often.  A one-time screening for abdominal aortic aneurysm (AAA) by ultrasound is recommended for men aged 48-75 years who are current or former smokers. What should I know about preventing infection? Hepatitis B If you have a higher risk for hepatitis B, you should be screened for this virus. Talk with your health care provider to find out if you are at risk for hepatitis B infection. Hepatitis C Blood testing is recommended for:  Everyone born from [birth year] through 1966.  Anyone with known risk factors for hepatitis C.  Sexually Transmitted Diseases (STDs)  You should be screened each year for STDs including gonorrhea and chlamydia if: ? You are sexually active and are younger than 43 years of age. ? You are older than 43 years of age and your health care provider tells you that you are at risk for this type of infection. ? Your sexual activity has changed since you were last screened and you are at an increased risk for chlamydia or gonorrhea. Ask your health care provider if you are at risk.  Talk with your health care provider about whether you are at high risk of being infected with HIV. Your health care provider may recommend a prescription medicine to help prevent HIV  infection.  What else can I do?  Schedule regular health, dental, and eye exams.  Stay current with your vaccines (immunizations).  Do not use any tobacco products, such as cigarettes, chewing tobacco, and e-cigarettes. If you need help quitting, ask your health care provider.  Limit alcohol intake to no more than 2 drinks per day. One drink equals 12 ounces of beer, 5 ounces of wine, or 1 ounces of hard liquor.  Do not use street drugs.  Do not share needles.  Ask your health care provider for help if you need support or information about quitting drugs.  Tell your health care provider if you often feel depressed.  Tell your health care provider if you have ever been abused or do not feel safe at home. This information is not intended to replace advice given to you by your health care provider. Make sure you discuss any questions you have with your health care provider. Document Released: 05/11/2008 Document Revised: 07/12/2016 Document Reviewed:  08/17/2015 Elsevier Interactive Patient Education  [employer].

## 2017-12-03 ENCOUNTER — Encounter: Payer: Self-pay | Admitting: Internal Medicine

## 2017-12-03 ENCOUNTER — Ambulatory Visit (INDEPENDENT_AMBULATORY_CARE_PROVIDER_SITE_OTHER): Payer: 59 | Admitting: Internal Medicine

## 2017-12-03 VITALS — BP 134/86 | HR 62 | Temp 97.7°F | Resp 16 | Ht 72.0 in | Wt 213.0 lb

## 2017-12-03 DIAGNOSIS — F1010 Alcohol abuse, uncomplicated: Secondary | ICD-10-CM

## 2017-12-03 DIAGNOSIS — K219 Gastro-esophageal reflux disease without esophagitis: Secondary | ICD-10-CM

## 2017-12-03 DIAGNOSIS — F3289 Other specified depressive episodes: Secondary | ICD-10-CM | POA: Diagnosis not present

## 2017-12-03 DIAGNOSIS — Z0001 Encounter for general adult medical examination with abnormal findings: Secondary | ICD-10-CM

## 2017-12-03 DIAGNOSIS — I10 Essential (primary) hypertension: Secondary | ICD-10-CM | POA: Diagnosis not present

## 2017-12-03 DIAGNOSIS — F32A Depression, unspecified: Secondary | ICD-10-CM | POA: Insufficient documentation

## 2017-12-03 DIAGNOSIS — K580 Irritable bowel syndrome with diarrhea: Secondary | ICD-10-CM

## 2017-12-03 DIAGNOSIS — Z0000 Encounter for general adult medical examination without abnormal findings: Secondary | ICD-10-CM

## 2017-12-03 DIAGNOSIS — E781 Pure hyperglyceridemia: Secondary | ICD-10-CM

## 2017-12-03 DIAGNOSIS — F329 Major depressive disorder, single episode, unspecified: Secondary | ICD-10-CM | POA: Insufficient documentation

## 2017-12-03 MED ORDER — SERTRALINE HCL 50 MG PO TABS: 50.0000 mg | ORAL_TABLET | Freq: Every day | ORAL | 1 refills | Status: DC

## 2017-12-03 NOTE — Assessment & Plan Note (Signed)
Continue tricor Working on decreasing alcohol  - drinks once a week but binges when he does drink

## 2017-12-03 NOTE — Assessment & Plan Note (Signed)
New, some mild anxiety more recently with marriage stresses No concerning symptoms Will start sertraline 50 mg nightly Discussed some possible side effects F/u in 3 months, sooner if needed

## 2017-12-03 NOTE — Assessment & Plan Note (Signed)
Taking librax daily Symptoms controlled

## 2017-12-03 NOTE — Assessment & Plan Note (Signed)
BP well controlled Current regimen effective and well tolerated Continue current medications at current doses  

## 2017-12-03 NOTE — Assessment & Plan Note (Signed)
GERD controlled Continue daily medication  

## 2017-12-03 NOTE — Assessment & Plan Note (Signed)
Has decreased alcohol use - often drinks less than once a week, but can drink 5-6 drinks at a time

## 2018-01-03 DIAGNOSIS — H04123 Dry eye syndrome of bilateral lacrimal glands: Secondary | ICD-10-CM | POA: Diagnosis not present

## 2018-01-22 ENCOUNTER — Other Ambulatory Visit: Payer: Self-pay | Admitting: Family Medicine

## 2018-01-22 ENCOUNTER — Other Ambulatory Visit: Payer: Self-pay | Admitting: Internal Medicine

## 2018-03-03 NOTE — Progress Notes (Signed)
Subjective:    Patient ID: Christopher [name], male    DOB: 1976/06/11, 42 y.o.   MRN: [id number]  HPI The patient is here for follow up.  Depression: we started him on sertraline 3 months ago.  He has been having a lot os stress related to his marriage.  He was taking his medication daily initially but then stopped - it was hard to remember to take it at night. He denies any side effects from the medication. He is unsure if it helped or not.  He still has ups and downs.  He has seen a therapist.  He thinks him and his wife are in a better place.  He is unsure if he needs the medication or not.   Hypertension: He is taking his medication daily. He is compliant with a low sodium diet.  He denies chest pain, palpitations, edema, shortness of breath and regular headaches.   Alcohol abuse:  When he was here in Jan he was drinking 5-6 drinks at a time, but was only drinking once a week.  This has not changed - he continues to try to keep his drinking in check.    Medications and allergies reviewed with patient and updated if appropriate.  Patient Active Problem List   Diagnosis Date Noted  . Depression 12/03/2017  . History of gout 08/23/2017  . Erectile dysfunction 08/23/2017  . History of pancreatitis 02/16/2017  . Skin lesions 08/06/2015  . Renal mass, right 07/31/2014  . Alcohol abuse 07/22/2014  . Fatty liver 07/22/2014  . Equinus deformity of foot, acquired 03/02/2014  . 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 ALLERGIC RHINITIS DUE TO POLLEN 02/14/2010  . TRANSAMINASES, SERUM, ELEVATED 05/19/2009  . Essential hypertension 03/09/2008  . Hypertriglyceridemia 08/16/2007  . GERD 08/16/2007  . IBS (irritable bowel syndrome) 08/15/2007    Current Outpatient Medications on File Prior to Visit  Medication Sig Dispense Refill  . amLODipine (NORVASC) 5 MG tablet TAKE 1 TABLET BY MOUTH  DAILY 90 tablet 3  . clidinium-chlordiazePOXIDE (LIBRAX) 5-2.5 MG capsule TAKE 1 CAPSULE BY MOUTH  DAILY 90  capsule 1  . fenofibrate (TRICOR) 145 MG tablet TAKE 1 TABLET BY MOUTH  DAILY 90 tablet 1  . losartan (COZAAR) 100 MG tablet TAKE 1 TABLET BY MOUTH  DAILY 90 tablet 3  . Multiple Vitamin (MULTIVITAMIN WITH MINERALS) TABS tablet Take 1 tablet by mouth daily.    . pantoprazole (PROTONIX) 40 MG tablet TAKE 1 TABLET BY MOUTH  DAILY 90 tablet 1  . sertraline (ZOLOFT) 50 MG tablet Take 1 tablet (50 mg total) by mouth at bedtime. 90 tablet 1   No current facility-administered medications on file prior to visit.     Past Medical History:  Diagnosis Date  . GERD (gastroesophageal reflux disease)   . H/O hiatal hernia   . Hepatic steatosis   . Hyperlipidemia   . Hypertension   . IBS (irritable bowel syndrome)   . Kidney cyst, acquired   . Migraine    "probably monthly" (07/22/2014)  . 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 Pain in lower limb 03/02/2014  . Pancreatitis     Past Surgical History:  Procedure Laterality Date  . CARPAL TUNNEL RELEASE Right 2008  . COLONOSCOPY     IBS  . CYST EXCISION Right 2015   "2nd toe"  . KNEE ARTHROSCOPY Right 1992  . LASIK    . REFRACTIVE SURGERY Bilateral 2013  . SHOULDER SURGERY  10/2016  . UPPER GASTROINTESTINAL ENDOSCOPY    .  UPPER GI ENDOSCOPY     chest pain  . WRIST FRACTURE SURGERY Right 2008   "got plate and screws"    Social History   Socioeconomic History  . Marital status: Married    Spouse name: Not on file  . Number of children: 2  . Years of education: Not on file  . Highest education level: Not on file  Occupational History  . Not on file  Social Needs  . Financial resource strain: Not on file  . Food insecurity:    Worry: Not on file    Inability: Not on file  . Transportation needs:    Medical: Not on file    Non-medical: Not on file  Tobacco Use  . Smoking status: Never Smoker  . Smokeless tobacco: Never Used  Substance and Sexual Activity  . Alcohol use: Yes    Comment: 5-6 drinks once a week  . Drug use: No  . Sexual  activity: Yes  Lifestyle  . Physical activity:    Days per week: Not on file    Minutes per session: Not on file  . Stress: Not on file  Relationships  . Social connections:    Talks on phone: Not on file    Gets together: Not on file    Attends religious service: Not on file    Active member of club or organization: Not on file    Attends meetings of clubs or organizations: Not on file    Relationship status: Not on file  Other Topics Concern  . 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 Hypertension Mother   . Hyperlipidemia Mother   . Depression Mother   . Hypertension Father   . Alzheimer's disease Maternal Grandmother   . Alcohol abuse Maternal Grandfather   . Alzheimer's disease Paternal Grandmother   . Alcohol abuse Paternal Grandfather   . Depression Sister   . Colon cancer Neg Hx     Review of Systems  Constitutional: Negative for chills.  Respiratory: Negative for cough, shortness of breath and wheezing.   Cardiovascular: Negative for chest pain, palpitations and leg swelling.  Neurological: Positive for headaches (occ). Negative for light-headedness.       Objective:   Vitals:   03/04/18 0758  BP: 130/88  Pulse: (!) 57  Resp: 16  Temp: 97.9 F (36.6 C)  SpO2: 99%   BP Readings from Last 3 Encounters:  03/04/18 130/88  12/03/17 134/86  08/23/17 (!) 140/94   Wt Readings from Last 3 Encounters:  03/04/18 214 lb (97.1 kg)  12/03/17 213 lb (96.6 kg)  08/23/17 216 lb (98 kg)   Body mass index is 29.02 kg/m.   Physical Exam    Constitutional: Appears well-developed and well-nourished. No distress.  HENT:  Head: Normocephalic and atraumatic.  Neck: Neck supple. No tracheal deviation present. No thyromegaly present.  No cervical  lymphadenopathy Cardiovascular: Normal rate, regular rhythm and normal heart sounds.   No murmur heard. No carotid bruit .  No edema Pulmonary/Chest: Effort normal and breath sounds normal. No respiratory distress. No has no wheezes. No rales.  Skin: Skin is warm and dry. Not diaphoretic.  Psychiatric: Normal mood and affect. Behavior is normal.      Assessment &  Plan:    See Problem List for Assessment and Plan of chronic medical problems.

## 2018-03-04 ENCOUNTER — Encounter: Payer: Self-pay | Admitting: Internal Medicine

## 2018-03-04 ENCOUNTER — Ambulatory Visit: Payer: 59 | Admitting: Internal Medicine

## 2018-03-04 ENCOUNTER — Other Ambulatory Visit (INDEPENDENT_AMBULATORY_CARE_PROVIDER_SITE_OTHER): Payer: 59

## 2018-03-04 VITALS — BP 130/88 | HR 57 | Temp 97.9°F | Resp 16 | Wt 214.0 lb

## 2018-03-04 DIAGNOSIS — I10 Essential (primary) hypertension: Secondary | ICD-10-CM | POA: Diagnosis not present

## 2018-03-04 DIAGNOSIS — F3289 Other specified depressive episodes: Secondary | ICD-10-CM

## 2018-03-04 LAB — COMPREHENSIVE METABOLIC PANEL
ALBUMIN: 4.2 g/dL (ref 3.5–5.2)
ALT: 54 U/L — ABNORMAL HIGH (ref 0–53)
AST: 43 U/L — AB (ref 0–37)
Alkaline Phosphatase: 33 U/L — ABNORMAL LOW (ref 39–117)
BUN: 16 mg/dL (ref 6–23)
CHLORIDE: 105 meq/L (ref 96–112)
CO2: 27 meq/L (ref 19–32)
CREATININE: 1.14 mg/dL (ref 0.40–1.50)
Calcium: 9.3 mg/dL (ref 8.4–10.5)
GFR: 74.71 mL/min (ref >60.00)
GLUCOSE: 96 mg/dL (ref 70–99)
Potassium: 4.2 mEq/L (ref 3.5–5.1)
SODIUM: 142 meq/L (ref 135–145)
Total Bilirubin: 0.6 mg/dL (ref 0.2–1.2)
Total Protein: 7.1 g/dL (ref 6.0–8.3)

## 2018-03-04 NOTE — Assessment & Plan Note (Signed)
Did not stick with taking medication - will try taking it in the morning - wants to try that first before trying a new med He will update me via mychart - can adjust medication if needed Continue with therapist F/u in 6 months - sooner if needed

## 2018-03-04 NOTE — Assessment & Plan Note (Signed)
BP well controlled Current regimen effective and well tolerated Continue current medications at current doses cmp  

## 2018-03-04 NOTE — Patient Instructions (Addendum)
  Test(s) ordered today. Your results will be released to MyChart (or called to you) after review, usually within 72hours after test completion. If any changes need to be made, you will be notified at that same time.  Medications reviewed and updated.  No changes recommended at this time.    Please followup in 6 months   

## 2018-06-12 DIAGNOSIS — L570 Actinic keratosis: Secondary | ICD-10-CM | POA: Diagnosis not present

## 2018-06-12 DIAGNOSIS — L281 Prurigo nodularis: Secondary | ICD-10-CM | POA: Diagnosis not present

## 2018-07-31 ENCOUNTER — Other Ambulatory Visit: Payer: Self-pay | Admitting: Internal Medicine

## 2018-08-21 ENCOUNTER — Encounter: Payer: Self-pay | Admitting: Internal Medicine

## 2018-09-03 NOTE — Patient Instructions (Addendum)
  Tests ordered today. Your results will be released to MyChart (or called to you) after review, usually within 72hours after test completion. If any changes need to be made, you will be notified at that same time.  Flu immunization administered today.    Medications reviewed and updated.  Changes include :   none    Please followup in 6 months   

## 2018-09-03 NOTE — Progress Notes (Signed)
Subjective:    Patient ID: Christopher [name], male    DOB: 1976-10-10, 42 y.o.   MRN: [id number]  HPI The patient is here for follow up.  Depression: He is not taking his medication. He did not have any side effects from the medication, but did not feel the medication did anything.  he denies depression.     Hypertension: He is taking his medication daily. He is compliant with a low sodium diet.  He denies chest pain, palpitations, edema, shortness of breath and regular headaches. He is not exercising regularly.  He does not monitor his blood pressure at home.    Alcohol abuse:  At his last visit he had decreased his alcohol use.  He is drinking once a week  Hypertriglyceridemia: He is taking his medication daily. He is compliant with a low fat/cholesterol diet. He is exercising regularly. He denies myalgias.   GERD:  He is taking his medication daily as prescribed.  He denies any GERD symptoms and feels his GERD is well controlled.     Medications and allergies reviewed with patient and updated if appropriate.  Patient Active Problem List   Diagnosis Date Noted  . Depression 12/03/2017  . History of gout 08/23/2017  . Erectile dysfunction 08/23/2017  . History of pancreatitis 02/16/2017  . Skin lesions 08/06/2015  . Renal mass, right 07/31/2014  . Alcohol abuse 07/22/2014  . Fatty liver 07/22/2014  . Equinus deformity of foot, acquired 03/02/2014  . 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 ALLERGIC RHINITIS DUE TO POLLEN 02/14/2010  . Essential hypertension 03/09/2008  . Hypertriglyceridemia 08/16/2007  . GERD 08/16/2007  . IBS (irritable bowel syndrome) 08/15/2007    Current Outpatient Medications on File Prior to Visit  Medication Sig Dispense Refill  . amLODipine (NORVASC) 5 MG tablet TAKE 1 TABLET BY MOUTH  DAILY 90 tablet 3  . clidinium-chlordiazePOXIDE (LIBRAX) 5-2.5 MG capsule TAKE 1 CAPSULE BY MOUTH  DAILY 90 capsule 1  . fenofibrate (TRICOR) 145 MG tablet TAKE 1 TABLET BY  MOUTH  DAILY 90 tablet 1  . losartan (COZAAR) 100 MG tablet TAKE 1 TABLET BY MOUTH  DAILY 90 tablet 3  . Multiple Vitamin (MULTIVITAMIN WITH MINERALS) TABS tablet Take 1 tablet by mouth daily.    . pantoprazole (PROTONIX) 40 MG tablet TAKE 1 TABLET BY MOUTH  DAILY 90 tablet 1   No current facility-administered medications on file prior to visit.     Past Medical History:  Diagnosis Date  . GERD (gastroesophageal reflux disease)   . H/O hiatal hernia   . Hepatic steatosis   . Hyperlipidemia   . Hypertension   . IBS (irritable bowel syndrome)   . Kidney cyst, acquired   . Migraine    "probably monthly" (07/22/2014)  . 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 Pain in lower limb 03/02/2014  . Pancreatitis     Past Surgical History:  Procedure Laterality Date  . CARPAL TUNNEL RELEASE Right 2008  . COLONOSCOPY     IBS  . CYST EXCISION Right 2015   "2nd toe"  . KNEE ARTHROSCOPY Right 1992  . LASIK    . REFRACTIVE SURGERY Bilateral 2013  . SHOULDER SURGERY  10/2016  . UPPER GASTROINTESTINAL ENDOSCOPY    . UPPER GI ENDOSCOPY     chest pain  . WRIST FRACTURE SURGERY Right 2008   "got plate and screws"    Social History   Socioeconomic History  . Marital status: Married    Spouse name: Not  on file  . Number of children: 2  . Years of education: Not on file  . Highest education level: Not on file  Occupational History  . Not on file  Social Needs  . Financial resource strain: Not on file  . Food insecurity:    Worry: Not on file    Inability: Not on file  . Transportation needs:    Medical: Not on file    Non-medical: Not on file  Tobacco Use  . Smoking status: Never Smoker  . Smokeless tobacco: Never Used  Substance and Sexual Activity  . Alcohol use: Yes    Comment: 5-6 drinks once a week  . Drug use: No  . Sexual activity: Yes  Lifestyle  . Physical activity:    Days per week: Not on file    Minutes per session: Not on file  . Stress: Not on file  Relationships  . Social  connections:    Talks on phone: Not on file    Gets together: Not on file    Attends religious service: Not on file    Active member of club or organization: Not on file    Attends meetings of clubs or organizations: Not on file    Relationship status: Not on file  Other Topics Concern  . 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 Hypertension Mother   . Hyperlipidemia Mother   . Depression Mother   . Hypertension Father   . Alzheimer's disease Maternal Grandmother   . Alcohol abuse Maternal Grandfather   . Alzheimer's disease Paternal Grandmother   . Alcohol abuse Paternal Grandfather   . Depression Sister   . Colon cancer Neg Hx     Review of Systems  Constitutional: Negative for chills and fever.  Respiratory: Positive for cough (allergies/weather). Negative for shortness of breath and wheezing.   Cardiovascular: Positive for leg swelling (occasinal). Negative for chest pain and palpitations.  Gastrointestinal: Negative for abdominal pain and nausea.  Neurological: Negative for light-headedness and headaches.       Objective:   Vitals:   09/04/18 0800  BP: (!) 142/86  Pulse: 65  Resp: 16  Temp: (!) 97.5 F (36.4 C)  SpO2: 95%   BP Readings from Last 3 Encounters:  09/04/18 (!) 142/86  03/04/18 130/88  12/03/17 134/86   Wt Readings from Last 3 Encounters:  09/04/18 220 lb 6.4 oz (100 kg)  03/04/18 214 lb (97.1 kg)  12/03/17 213 lb (96.6 kg)   Body mass index is 29.89 kg/m.   Physical Exam    Constitutional: Appears well-developed and well-nourished. No distress.  HENT:  Head: Normocephalic and atraumatic.  Neck: Neck supple. No tracheal deviation present. No thyromegaly present.  No cervical lymphadenopathy Cardiovascular: Normal rate, regular rhythm  and normal heart sounds.   No murmur heard. No carotid bruit .  No edema Pulmonary/Chest: Effort normal and breath sounds normal. No respiratory distress. No has no wheezes. No rales.  Skin: Skin is warm and dry. Not diaphoretic.  Psychiatric: Normal mood and affect. Behavior is normal.      Assessment & Plan:    See Problem List for Assessment and Plan of chronic medical problems.

## 2018-09-04 ENCOUNTER — Encounter: Payer: Self-pay | Admitting: Internal Medicine

## 2018-09-04 ENCOUNTER — Ambulatory Visit: Payer: 59 | Admitting: Internal Medicine

## 2018-09-04 ENCOUNTER — Other Ambulatory Visit (INDEPENDENT_AMBULATORY_CARE_PROVIDER_SITE_OTHER): Payer: 59

## 2018-09-04 VITALS — BP 142/86 | HR 65 | Temp 97.5°F | Resp 16 | Ht 72.0 in | Wt 220.4 lb

## 2018-09-04 DIAGNOSIS — Z23 Encounter for immunization: Secondary | ICD-10-CM | POA: Diagnosis not present

## 2018-09-04 DIAGNOSIS — K219 Gastro-esophageal reflux disease without esophagitis: Secondary | ICD-10-CM

## 2018-09-04 DIAGNOSIS — I10 Essential (primary) hypertension: Secondary | ICD-10-CM

## 2018-09-04 DIAGNOSIS — F1010 Alcohol abuse, uncomplicated: Secondary | ICD-10-CM

## 2018-09-04 DIAGNOSIS — E781 Pure hyperglyceridemia: Secondary | ICD-10-CM

## 2018-09-04 DIAGNOSIS — F3289 Other specified depressive episodes: Secondary | ICD-10-CM | POA: Diagnosis not present

## 2018-09-04 LAB — LIPID PANEL
CHOL/HDL RATIO: 4
Cholesterol: 165 mg/dL (ref 0–200)
HDL: 37.4 mg/dL — AB (ref >39.00)
LDL CALC: 102 mg/dL — AB (ref 0–99)
NonHDL: 127.84
Triglycerides: 130 mg/dL (ref 0.0–149.0)
VLDL: 26 mg/dL (ref 0.0–40.0)

## 2018-09-04 LAB — COMPREHENSIVE METABOLIC PANEL
ALT: 69 U/L — AB (ref 0–53)
AST: 39 U/L — ABNORMAL HIGH (ref 0–37)
Albumin: 4.2 g/dL (ref 3.5–5.2)
Alkaline Phosphatase: 38 U/L — ABNORMAL LOW (ref 39–117)
BILIRUBIN TOTAL: 0.5 mg/dL (ref 0.2–1.2)
BUN: 15 mg/dL (ref 6–23)
CALCIUM: 9.2 mg/dL (ref 8.4–10.5)
CHLORIDE: 106 meq/L (ref 96–112)
CO2: 28 meq/L (ref 19–32)
Creatinine, Ser: 1.09 mg/dL (ref 0.40–1.50)
GFR: 78.49 mL/min (ref >60.00)
Glucose, Bld: 107 mg/dL — ABNORMAL HIGH (ref 70–99)
POTASSIUM: 4.2 meq/L (ref 3.5–5.1)
Sodium: 141 mEq/L (ref 135–145)
Total Protein: 7.3 g/dL (ref 6.0–8.3)

## 2018-09-04 NOTE — Assessment & Plan Note (Signed)
Check lipid panel  Continue daily tricor Regular exercise, weight loss and healthy diet encouraged

## 2018-09-04 NOTE — Addendum Note (Signed)
Addended by: Delice Bison E on: 09/04/2018 11:30 AM   Modules accepted: Orders

## 2018-09-04 NOTE — Assessment & Plan Note (Signed)
BP slightly elevated today Advised him to monitor at work Stressed good control of BP encouraged regular exercise, low sodium diet, weight loss cmp

## 2018-09-04 NOTE — Assessment & Plan Note (Signed)
Things between him and his wife are better Sertraline did not have much effect so he stopped it - denies any change Does not currently feel depressed Monitor of medication

## 2018-09-04 NOTE — Assessment & Plan Note (Signed)
Not drinking every day - has cut down He understands the importance of keeping alcohol to a minimum

## 2018-09-04 NOTE — Assessment & Plan Note (Signed)
GERD controlled Continue daily medication  

## 2018-09-05 ENCOUNTER — Encounter: Payer: Self-pay | Admitting: Internal Medicine

## 2018-11-01 ENCOUNTER — Other Ambulatory Visit: Payer: Self-pay | Admitting: Internal Medicine

## 2018-11-19 DIAGNOSIS — M10072 Idiopathic gout, left ankle and foot: Secondary | ICD-10-CM | POA: Diagnosis not present

## 2018-11-20 DIAGNOSIS — S0012XA Contusion of left eyelid and periocular area, initial encounter: Secondary | ICD-10-CM | POA: Diagnosis not present

## 2018-11-20 DIAGNOSIS — S0101XA Laceration without foreign body of scalp, initial encounter: Secondary | ICD-10-CM | POA: Diagnosis not present

## 2019-01-07 ENCOUNTER — Other Ambulatory Visit: Payer: Self-pay | Admitting: Internal Medicine

## 2019-01-10 DIAGNOSIS — H04123 Dry eye syndrome of bilateral lacrimal glands: Secondary | ICD-10-CM | POA: Diagnosis not present

## 2019-03-05 ENCOUNTER — Other Ambulatory Visit: Payer: Self-pay | Admitting: Internal Medicine

## 2019-03-05 ENCOUNTER — Encounter: Payer: Self-pay | Admitting: Internal Medicine

## 2019-03-12 ENCOUNTER — Encounter: Payer: 59 | Admitting: Internal Medicine

## 2019-04-15 ENCOUNTER — Encounter: Payer: Self-pay | Admitting: Internal Medicine

## 2019-05-23 ENCOUNTER — Encounter: Payer: 59 | Admitting: Internal Medicine

## 2019-05-27 DIAGNOSIS — R739 Hyperglycemia, unspecified: Secondary | ICD-10-CM | POA: Insufficient documentation

## 2019-05-27 NOTE — Progress Notes (Signed)
Subjective:    Patient ID: Christopher [name], male    DOB: 11/03/1976, 43 y.o.   MRN: [id number]  HPI He is here for a physical exam.   He has a starburst like sensation or firework sensation on his back  - middle back up to shoulders.  He had it on his chest once.  It is like a tingling sensation, not numbness.  He often feels it when he walks out of an air conditioning building into the heat or if he has overexerted himself in the heat.  This sensation will only last a few seconds.  He had two ticks attached this summer that his wife pulled off.  One was a starry tick.  He states they were not on for more than 24 hours.  He feels more forgetful recently.  The example he gives is that he will ask his wife where something is and she will states she did not touch it only will find at work he left it, but did not remember where he left it.  He feels mild depression.  He does not think it is anxiety, but tends to pick at his skin and wonders if there is some underlying anxiety.  He wondered if he should go back on the medication we started him on for depression a while ago-he thinks he did not give it a fair shot.     Medications and allergies reviewed with patient and updated if appropriate.  Patient Active Problem List   Diagnosis Date Noted  . Hyperglycemia 05/27/2019  . Depression 12/03/2017  . History of gout 08/23/2017  . Erectile dysfunction 08/23/2017  . History of pancreatitis 02/16/2017  . Skin lesions 08/06/2015  . Renal mass, right 07/31/2014  . Alcohol abuse 07/22/2014  . Fatty liver 07/22/2014  . Equinus deformity of foot, acquired 03/02/2014  . 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 ALLERGIC RHINITIS DUE TO POLLEN 02/14/2010  . Essential hypertension 03/09/2008  . Hypertriglyceridemia 08/16/2007  . GERD 08/16/2007  . IBS (irritable bowel syndrome) 08/15/2007    Current Outpatient Medications on File Prior to Visit  Medication Sig Dispense Refill  . amLODipine (NORVASC)  5 MG tablet TAKE 1 TABLET BY MOUTH  DAILY 90 tablet 3  . clidinium-chlordiazePOXIDE (LIBRAX) 5-2.5 MG capsule TAKE 1 CAPSULE BY MOUTH  DAILY 90 capsule 1  . fenofibrate (TRICOR) 145 MG tablet TAKE 1 TABLET BY MOUTH  DAILY 90 tablet 1  . losartan (COZAAR) 100 MG tablet TAKE 1 TABLET BY MOUTH  DAILY 90 tablet 3  . Multiple Vitamin (MULTIVITAMIN WITH MINERALS) TABS tablet Take 1 tablet by mouth daily.    . pantoprazole (PROTONIX) 40 MG tablet TAKE 1 TABLET BY MOUTH  DAILY 90 tablet 1   No current facility-administered medications on file prior to visit.     Past Medical History:  Diagnosis Date  . GERD (gastroesophageal reflux disease)   . H/O hiatal hernia   . Hepatic steatosis   . Hyperlipidemia   . Hypertension   . IBS (irritable bowel syndrome)   . Kidney cyst, acquired   . Migraine    "probably monthly" (07/22/2014)  . 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 Pain in lower limb 03/02/2014  . Pancreatitis     Past Surgical History:  Procedure Laterality Date  . CARPAL TUNNEL RELEASE Right 2008  . COLONOSCOPY     IBS  . CYST EXCISION Right 2015   "2nd toe"  . KNEE ARTHROSCOPY Right 1992  . LASIK    .  REFRACTIVE SURGERY Bilateral 2013  . SHOULDER SURGERY  10/2016  . UPPER GASTROINTESTINAL ENDOSCOPY    . UPPER GI ENDOSCOPY     chest pain  . WRIST FRACTURE SURGERY Right 2008   "got plate and screws"    Social History   Socioeconomic History  . Marital status: Married    Spouse name: Not on file  . Number of children: 2  . Years of education: Not on file  . Highest education level: Not on file  Occupational History  . Not on file  Social Needs  . Financial resource strain: Not on file  . Food insecurity    Worry: Not on file    Inability: Not on file  . Transportation needs    Medical: Not on file    Non-medical: Not on file  Tobacco Use  . Smoking status: Never Smoker  . Smokeless tobacco: Never Used  Substance and Sexual Activity  . Alcohol use: Yes    Comment: 5-6 drinks  once a week  . Drug use: No  . Sexual activity: Yes  Lifestyle  . Physical activity    Days per week: Not on file    Minutes per session: Not on file  . Stress: Not on file  Relationships  . Social Herbalist on phone: Not on file    Gets together: Not on file    Attends religious service: Not on file    Active member of club or organization: Not on file    Attends meetings of clubs or organizations: Not on file    Relationship status: Not on file  Other Topics Concern  . 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 Hypertension Mother   . Hyperlipidemia Mother   . Depression Mother   . Hypertension Father   . Alzheimer's disease Maternal Grandmother   . Alcohol abuse Maternal Grandfather   . Alzheimer's disease Paternal Grandmother   . Alcohol abuse Paternal Grandfather   . Depression Sister   . Colon cancer Neg Hx     Review of Systems  Constitutional: Negative for chills and fever.  Eyes: Negative for visual disturbance.  Respiratory: Negative for cough, shortness of breath and wheezing.   Cardiovascular: Positive for palpitations (rare) and leg swelling (mild on occasion - if on his feet for long periods, after drinking alcohol). Negative for chest pain.  Gastrointestinal: Negative for abdominal pain, blood in stool, constipation, diarrhea (loose stools - IBS) and nausea.       GERD controlled  Genitourinary: Negative for difficulty urinating, dysuria and hematuria.  Musculoskeletal: Positive for arthralgias (mild arthritis) and back pain (lower back pain). Negative for neck pain.  Skin: Negative for color change and rash.  Neurological: Negative for light-headedness, numbness (none in fingers or toes, tingling or firework in back) and headaches.   Psychiatric/Behavioral: Positive for dysphoric mood. The patient is nervous/anxious.        Objective:   Vitals:   05/28/19 1044  BP: (!) 142/96  Pulse: 73  Resp: 16  Temp: (!) 97.5 F (36.4 C)  SpO2: 97%   Filed Weights   05/28/19 1044  Weight: 213 lb (96.6 kg)   Body mass index is 28.89 kg/m.  Wt Readings  from Last 3 Encounters:  05/28/19 213 lb (96.6 kg)  09/04/18 220 lb 6.4 oz (100 kg)  03/04/18 214 lb (97.1 kg)     Physical Exam Constitutional: He appears well-developed and well-nourished. No distress.  HENT:  Head: Normocephalic and atraumatic.  Right Ear: External ear normal.  Left Ear: External ear normal.  Mouth/Throat: Oropharynx is clear and moist.  Normal ear canals and TM b/l  Eyes: Conjunctivae and EOM are normal.  Neck: Neck supple. No tracheal deviation present. No thyromegaly present.  No carotid bruit  Cardiovascular: Normal rate, regular rhythm, normal heart sounds and intact distal pulses.   No murmur heard. Pulmonary/Chest: Effort normal and breath sounds normal. No respiratory distress. He has no wheezes. He has no rales.  Abdominal: Soft. He exhibits no distension. There is no tenderness.  Genitourinary: deferred  Musculoskeletal: He exhibits no edema.  Lymphadenopathy:   He has no cervical adenopathy.  Skin: Skin is warm and dry. He is not diaphoretic.  Psychiatric: He has a normal mood and affect. His behavior is normal.         Assessment & Plan:   Physical exam: Screening blood work   ordered Immunizations   Up to date  Exercise  None - walks dog a couple of times a week Weight  overweight-advised weight loss Skin no concerns - sees derm Substance abuse  Drinking in social situations only - trying to limit it  See Problem List for Assessment and Plan of chronic medical problems.

## 2019-05-27 NOTE — Patient Instructions (Addendum)
Tests ordered today. Your results will be released to [place] (or called to you) after review, usually within 72hours after test completion. If any changes need to be made, you will be notified at that same time.  All other Health Maintenance issues reviewed.   All recommended immunizations and age-appropriate screenings are up-to-date or discussed.  No immunizations administered today.   Medications reviewed and updated.  Changes include :   Increase amlodipine to 10 mg daily.  Restart the sertraline at night.   Try melatonin for your sleep.   Your prescription(s) have been submitted to your pharmacy. Please take as directed and contact our office if you believe you are having problem(s) with the medication(s).   Please followup in 6 months    Health Maintenance, Male Adopting a healthy lifestyle and getting preventive care are important in promoting health and wellness. Ask your health care provider about:  The right schedule for you to have regular tests and exams.  Things you can do on your own to prevent diseases and keep yourself healthy. What should I know about diet, weight, and exercise? Eat a healthy diet   Eat a diet that includes plenty of vegetables, fruits, low-fat dairy products, and lean protein.  Do not eat a lot of foods that are high in solid fats, added sugars, or sodium. Maintain a healthy weight Body mass index (BMI) is a measurement that can be used to identify possible weight problems. It estimates body fat based on height and weight. Your health care provider can help determine your BMI and help you achieve or maintain a healthy weight. Get regular exercise Get regular exercise. This is one of the most important things you can do for your health. Most adults should:  Exercise for at least 150 minutes each week. The exercise should increase your heart rate and make you sweat (moderate-intensity exercise).  Do strengthening exercises at least twice a week.  This is in addition to the moderate-intensity exercise.  Spend less time sitting. Even light physical activity can be beneficial. Watch cholesterol and blood lipids Have your blood tested for lipids and cholesterol at 44 years of age, then have this test every 5 years. You may need to have your cholesterol levels checked more often if:  Your lipid or cholesterol levels are high.  You are older than 44 years of age.  You are at high risk for heart disease. What should I know about cancer screening? Many types of cancers can be detected early and may often be prevented. Depending on your health history and family history, you may need to have cancer screening at various ages. This may include screening for:  Colorectal cancer.  Prostate cancer.  Skin cancer.  Lung cancer. What should I know about heart disease, diabetes, and high blood pressure? Blood pressure and heart disease  High blood pressure causes heart disease and increases the risk of stroke. This is more likely to develop in people who have high blood pressure readings, are of African descent, or are overweight.  Talk with your health care provider about your target blood pressure readings.  Have your blood pressure checked: ? Every 3-5 years if you are 59-11 years of age. ? Every year if you are 5 years old or older.  If you are between the ages of 22 and 4 and are a current or former smoker, ask your health care provider if you should have a one-time screening for abdominal aortic aneurysm (AAA). Diabetes Have regular diabetes  screenings. This checks your fasting blood sugar level. Have the screening done:  Once every three years after age 58 if you are at a normal weight and have a low risk for diabetes.  More often and at a younger age if you are overweight or have a high risk for diabetes. What should I know about preventing infection? Hepatitis B If you have a higher risk for hepatitis B, you should be  screened for this virus. Talk with your health care provider to find out if you are at risk for hepatitis B infection. Hepatitis C Blood testing is recommended for:  Everyone born from [birth year] through 1966.  Anyone with known risk factors for hepatitis C. Sexually transmitted infections (STIs)  You should be screened each year for STIs, including gonorrhea and chlamydia, if: ? You are sexually active and are younger than 44 years of age. ? You are older than 44 years of age and your health care provider tells you that you are at risk for this type of infection. ? Your sexual activity has changed since you were last screened, and you are at increased risk for chlamydia or gonorrhea. Ask your health care provider if you are at risk.  Ask your health care provider about whether you are at high risk for HIV. Your health care provider may recommend a prescription medicine to help prevent HIV infection. If you choose to take medicine to prevent HIV, you should first get tested for HIV. You should then be tested every 3 months for as long as you are taking the medicine. Follow these instructions at home: Lifestyle  Do not use any products that contain nicotine or tobacco, such as cigarettes, e-cigarettes, and chewing tobacco. If you need help quitting, ask your health care provider.  Do not use street drugs.  Do not share needles.  Ask your health care provider for help if you need support or information about quitting drugs. Alcohol use  Do not drink alcohol if your health care provider tells you not to drink.  If you drink alcohol: ? Limit how much you have to 0-2 drinks a day. ? Be aware of how much alcohol is in your drink. In the [place] one drink equals one 12 oz bottle of beer (355 mL), one 5 oz glass of wine (148 mL), or one 1 oz glass of hard liquor (44 mL). General instructions  Schedule regular health, dental, and eye exams.  Stay current with your vaccines.  Tell your health  care provider if: ? You often feel depressed. ? You have ever been abused or do not feel safe at home. Summary  Adopting a healthy lifestyle and getting preventive care are important in promoting health and wellness.  Follow your health care provider's instructions about healthy diet, exercising, and getting tested or screened for diseases.  Follow your health care provider's instructions on monitoring your cholesterol and blood pressure. This information is not intended to replace advice given to you by your health care provider. Make sure you discuss any questions you have with your health care provider. Document Released: 05/11/2008 Document Revised: 11/06/2018 Document Reviewed: 11/06/2018 Elsevier Patient Education  2020 [employer].

## 2019-05-28 ENCOUNTER — Other Ambulatory Visit: Payer: Self-pay

## 2019-05-28 ENCOUNTER — Encounter: Payer: Self-pay | Admitting: Internal Medicine

## 2019-05-28 ENCOUNTER — Ambulatory Visit (INDEPENDENT_AMBULATORY_CARE_PROVIDER_SITE_OTHER): Payer: 59 | Admitting: Internal Medicine

## 2019-05-28 ENCOUNTER — Other Ambulatory Visit (INDEPENDENT_AMBULATORY_CARE_PROVIDER_SITE_OTHER): Payer: 59

## 2019-05-28 VITALS — BP 142/96 | HR 73 | Temp 97.5°F | Resp 16 | Ht 72.0 in | Wt 213.0 lb

## 2019-05-28 DIAGNOSIS — E781 Pure hyperglyceridemia: Secondary | ICD-10-CM

## 2019-05-28 DIAGNOSIS — R413 Other amnesia: Secondary | ICD-10-CM | POA: Insufficient documentation

## 2019-05-28 DIAGNOSIS — I10 Essential (primary) hypertension: Secondary | ICD-10-CM

## 2019-05-28 DIAGNOSIS — K580 Irritable bowel syndrome with diarrhea: Secondary | ICD-10-CM

## 2019-05-28 DIAGNOSIS — Z0000 Encounter for general adult medical examination without abnormal findings: Secondary | ICD-10-CM

## 2019-05-28 DIAGNOSIS — R739 Hyperglycemia, unspecified: Secondary | ICD-10-CM

## 2019-05-28 DIAGNOSIS — F1010 Alcohol abuse, uncomplicated: Secondary | ICD-10-CM

## 2019-05-28 DIAGNOSIS — K760 Fatty (change of) liver, not elsewhere classified: Secondary | ICD-10-CM

## 2019-05-28 DIAGNOSIS — F3289 Other specified depressive episodes: Secondary | ICD-10-CM

## 2019-05-28 DIAGNOSIS — K219 Gastro-esophageal reflux disease without esophagitis: Secondary | ICD-10-CM

## 2019-05-28 DIAGNOSIS — R209 Unspecified disturbances of skin sensation: Secondary | ICD-10-CM

## 2019-05-28 LAB — CBC WITH DIFFERENTIAL/PLATELET
Basophils Absolute: 0 10*3/uL (ref 0.0–0.1)
Basophils Relative: 0.8 % (ref 0.0–3.0)
Eosinophils Absolute: 0.2 10*3/uL (ref 0.0–0.7)
Eosinophils Relative: 3.4 % (ref 0.0–5.0)
HCT: 41.9 % (ref 39.0–52.0)
Hemoglobin: 13.9 g/dL (ref 13.0–17.0)
Lymphocytes Relative: 23.3 % (ref 12.0–46.0)
Lymphs Abs: 1.1 10*3/uL (ref 0.7–4.0)
MCHC: 33.2 g/dL (ref 30.0–36.0)
MCV: 86.7 fl (ref 78.0–100.0)
Monocytes Absolute: 0.5 10*3/uL (ref 0.1–1.0)
Monocytes Relative: 10.7 % (ref 3.0–12.0)
Neutro Abs: 3 10*3/uL (ref 1.4–7.7)
Neutrophils Relative %: 61.8 % (ref 43.0–77.0)
Platelets: 362 10*3/uL (ref 150.0–400.0)
RBC: 4.83 Mil/uL (ref 4.22–5.81)
RDW: 13.9 % (ref 11.5–15.5)
WBC: 4.9 10*3/uL (ref 4.0–10.5)

## 2019-05-28 LAB — COMPREHENSIVE METABOLIC PANEL
ALT: 53 U/L (ref 0–53)
AST: 35 U/L (ref 0–37)
Albumin: 4.7 g/dL (ref 3.5–5.2)
Alkaline Phosphatase: 39 U/L (ref 39–117)
BUN: 18 mg/dL (ref 6–23)
CO2: 27 mEq/L (ref 19–32)
Calcium: 9.3 mg/dL (ref 8.4–10.5)
Chloride: 104 mEq/L (ref 96–112)
Creatinine, Ser: 1.26 mg/dL (ref 0.40–1.50)
GFR: 62.26 mL/min (ref >60.00)
Glucose, Bld: 82 mg/dL (ref 70–99)
Potassium: 3.9 mEq/L (ref 3.5–5.1)
Sodium: 141 mEq/L (ref 135–145)
Total Bilirubin: 0.6 mg/dL (ref 0.2–1.2)
Total Protein: 7.7 g/dL (ref 6.0–8.3)

## 2019-05-28 LAB — VITAMIN B12: Vitamin B-12: 489 pg/mL (ref 211–911)

## 2019-05-28 LAB — HEMOGLOBIN A1C: Hgb A1c MFr Bld: 5.4 % (ref 4.6–6.5)

## 2019-05-28 LAB — LIPID PANEL
Cholesterol: 165 mg/dL (ref 0–200)
HDL: 46.4 mg/dL (ref >39.00)
LDL Cholesterol: 91 mg/dL (ref 0–99)
NonHDL: 118.15
Total CHOL/HDL Ratio: 4
Triglycerides: 137 mg/dL (ref 0.0–149.0)
VLDL: 27.4 mg/dL (ref 0.0–40.0)

## 2019-05-28 LAB — TSH: TSH: 3.39 u[IU]/mL (ref 0.35–4.50)

## 2019-05-28 MED ORDER — SERTRALINE HCL 50 MG PO TABS: 50.0000 mg | ORAL_TABLET | Freq: Every day | ORAL | 1 refills | Status: DC

## 2019-05-28 MED ORDER — AMLODIPINE BESYLATE 10 MG PO TABS: 10.0000 mg | ORAL_TABLET | Freq: Every day | ORAL | 3 refills | Status: DC

## 2019-05-28 NOTE — Assessment & Plan Note (Signed)
He has a sensation on his upper-mid back that is like fireworks or tingling sensation that will last only a couple of seconds No concerning symptoms Unable to explain reason for symptoms We will monitor for now

## 2019-05-28 NOTE — Assessment & Plan Note (Signed)
He is vague about how much he is drinking, but is trying to limit it to only social situations, but does drink more than he should when he drinks

## 2019-05-28 NOTE — Assessment & Plan Note (Signed)
He does feel some mild depression and possibly anxiety-he is not sure He would like to retry the sertraline to see if that is effective Start sertraline 50 mg daily

## 2019-05-28 NOTE — Assessment & Plan Note (Signed)
Controlled Continue Librax

## 2019-05-28 NOTE — Assessment & Plan Note (Signed)
Blood pressure is not ideally controlled Increase amlodipine to 10 mg daily-discussed to monitor for leg edema Continue losartan 100 mg daily Encouraged regular exercise CMP, TSH, CBC

## 2019-05-28 NOTE — Assessment & Plan Note (Signed)
Reviewed that he does have a fatty liver Discussed increased risk of developing cirrhosis with drinking alcohol and having a fatty liver Advised weight loss

## 2019-05-28 NOTE — Assessment & Plan Note (Signed)
A1c

## 2019-05-28 NOTE — Assessment & Plan Note (Signed)
Lipid panel, CMP, TSH Continue fenofibrate

## 2019-05-28 NOTE — Assessment & Plan Note (Signed)
He states some memory changes, but this is likely related to mild depression/anxiety may be not pain attention Reassured that at this point he likely does not have early dementia Will check TSH, B12 level Encouraged regular exercise Avoid alcohol use He will monitor until his next appointment

## 2019-05-28 NOTE — Assessment & Plan Note (Signed)
GERD controlled Continue daily medication  

## 2019-05-29 ENCOUNTER — Encounter: Payer: Self-pay | Admitting: Internal Medicine

## 2019-06-09 ENCOUNTER — Encounter: Payer: Self-pay | Admitting: Internal Medicine

## 2019-06-10 ENCOUNTER — Telehealth: Payer: Self-pay | Admitting: *Deleted

## 2019-06-10 ENCOUNTER — Telehealth: Payer: Self-pay

## 2019-06-10 ENCOUNTER — Encounter: Payer: Self-pay | Admitting: Internal Medicine

## 2019-06-10 ENCOUNTER — Ambulatory Visit (INDEPENDENT_AMBULATORY_CARE_PROVIDER_SITE_OTHER): Payer: 59 | Admitting: Internal Medicine

## 2019-06-10 DIAGNOSIS — Z20822 Contact with and (suspected) exposure to covid-19: Secondary | ICD-10-CM

## 2019-06-10 DIAGNOSIS — B349 Viral infection, unspecified: Secondary | ICD-10-CM | POA: Diagnosis not present

## 2019-06-10 NOTE — Telephone Encounter (Signed)
Appointment scheduled.

## 2019-06-10 NOTE — Progress Notes (Signed)
Virtual Visit via Video Note  I connected with Christopher [name] on 06/10/19 at 11:15 AM EDT by a video enabled telemedicine application and verified that I am speaking with the correct person using two identifiers.   I discussed the limitations of evaluation and management by telemedicine and the availability of in person appointments. The patient expressed understanding and agreed to proceed.  The patient is currently at home and I am in the office.    No referring provider.    History of Present Illness: This is an acute visit for cold   This past week he was at the [facility].  He has been wearing a mask when out.  He has been eating out at restaurants.  He was there with his family and friends.   He states fatigue, sore throat, cough from drainage, mild nausea at times and has had a headache.     Review of Systems  Constitutional: Positive for malaise/fatigue. Negative for chills and fever.  HENT: Positive for sore throat. Negative for congestion, ear pain and sinus pain.   Respiratory: Positive for cough (related to PND). Negative for shortness of breath and wheezing.   Gastrointestinal: Positive for nausea (at times). Negative for diarrhea.  Musculoskeletal: Negative for myalgias.  Neurological: Positive for headaches (once).      Social History   Socioeconomic History  . Marital status: Married    Spouse name: Not on file  . Number of children: 2  . Years of education: Not on file  . Highest education level: Not on file  Occupational History  . Not on file  Social Needs  . Financial resource strain: Not on file  . Food insecurity    Worry: Not on file    Inability: Not on file  . Transportation needs    Medical: Not on file    Non-medical: Not on file  Tobacco Use  . Smoking status: Never Smoker  . Smokeless tobacco: Never Used  Substance and Sexual Activity  . Alcohol use: Yes    Comment: 5-6 drinks once a week  . Drug use: No  . Sexual activity: Yes   Lifestyle  . Physical activity    Days per week: Not on file    Minutes per session: Not on file  . Stress: Not on file  Relationships  . Social Herbalist on phone: Not on file    Gets together: Not on file    Attends religious service: Not on file    Active member of club or organization: Not on file    Attends meetings of clubs or organizations: Not on file    Relationship status: Not on file  Other Topics Concern  . Not on file  Social History Narrative   Married, second wife, daughter born in 13, son born in 2000   He is a Quarry manager for [employer]   3 caffeinated beverages daily     Observations/Objective: Appears well in NAD Breathing normally  Assessment and Plan:  See Problem List for Assessment and Plan of chronic medical problems.   Follow Up Instructions:    I discussed the assessment and treatment plan with the patient. The patient was provided an opportunity to ask questions and all were answered. The patient agreed with the plan and demonstrated an understanding of the instructions.   The patient was advised to call back or seek an in-person evaluation if the symptoms worsen or if the condition fails  to improve as anticipated.    [name], MD

## 2019-06-10 NOTE — Telephone Encounter (Signed)
Pt scheduled for testing at [facility] site on 06/11/19.

## 2019-06-10 NOTE — Telephone Encounter (Signed)
Pt referred for COVID-19 testing by Dr. Billey Gosling. Pt has sore throat and headache. Pt called and appt scheduled at [facility] location on 06/11/19. Pt advised to wear a mask and remain in the car at scheduled appt time. Pt verbalized understanding.

## 2019-06-10 NOTE — Telephone Encounter (Signed)
Pt has sore throat and headache. Dr. [name] would like pt to get tested for COVID. Call back number [phone].

## 2019-06-10 NOTE — Assessment & Plan Note (Signed)
Symptoms consistent with a possible viral illness Symptoms overall and mild, but given possible concern COVID-19 will get him tested Discussed that he needs to know if he is positive for his family, work and friends Advised that he should quarantine himself until results come back, which may take up to a week Symptomatic treatment Call with any questions or concerns or if symptoms change

## 2019-06-11 ENCOUNTER — Other Ambulatory Visit: Payer: 59

## 2019-06-11 DIAGNOSIS — Z20822 Contact with and (suspected) exposure to covid-19: Secondary | ICD-10-CM

## 2019-06-15 LAB — NOVEL CORONAVIRUS, NAA: SARS-CoV-2, NAA: NOT DETECTED

## 2019-07-14 ENCOUNTER — Other Ambulatory Visit: Payer: Self-pay | Admitting: Internal Medicine

## 2019-07-15 ENCOUNTER — Encounter: Payer: Self-pay | Admitting: Internal Medicine

## 2019-11-30 NOTE — Progress Notes (Signed)
Subjective:    Patient ID: Christopher [name], male    DOB: 12-02-1975, 44 y.o.   MRN: [id number]  HPI The patient is here for follow up.   He is not exercising regularly.     He is taking all of his medications as prescribed. He is no longer taking the sertraline.  He is unsure if it really helped.  It is not sure if he even took it long enough.  He denies depression and anxiety.     He has a spot on his right ear.  He did see derm and it was scraped or cut at that time.  It was about two years ago. It is rough.     Medications and allergies reviewed with patient and updated if appropriate.  Patient Active Problem List   Diagnosis Date Noted  . Viral illness 06/10/2019  . Memory changes 05/28/2019  . Skin sensation disturbance 05/28/2019  . Hyperglycemia 05/27/2019  . Depression 12/03/2017  . History of gout 08/23/2017  . Erectile dysfunction 08/23/2017  . History of pancreatitis 02/16/2017  . Skin lesions 08/06/2015  . Renal mass, right 07/31/2014  . Alcohol abuse 07/22/2014  . Fatty liver 07/22/2014  . Equinus deformity of foot, acquired 03/02/2014  . 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 ALLERGIC RHINITIS DUE TO POLLEN 02/14/2010  . Essential hypertension 03/09/2008  . Hypertriglyceridemia 08/16/2007  . GERD 08/16/2007  . IBS (irritable bowel syndrome) 08/15/2007    Current Outpatient Medications on File Prior to Visit  Medication Sig Dispense Refill  . amLODipine (NORVASC) 10 MG tablet Take 1 tablet (10 mg total) by mouth daily. 90 tablet 3  . clidinium-chlordiazePOXIDE (LIBRAX) 5-2.5 MG capsule TAKE 1 CAPSULE BY MOUTH  DAILY 90 capsule 1  . fenofibrate (TRICOR) 145 MG tablet TAKE 1 TABLET BY MOUTH  DAILY 90 tablet 1  . losartan (COZAAR) 100 MG tablet TAKE 1 TABLET BY MOUTH  DAILY 90 tablet 3  . Multiple Vitamin (MULTIVITAMIN WITH MINERALS) TABS tablet Take 1 tablet by mouth daily.    . pantoprazole (PROTONIX) 40 MG tablet TAKE 1 TABLET BY MOUTH  DAILY 90 tablet 1   No  current facility-administered medications on file prior to visit.    Past Medical History:  Diagnosis Date  . GERD (gastroesophageal reflux disease)   . H/O hiatal hernia   . Hepatic steatosis   . Hyperlipidemia   . Hypertension   . IBS (irritable bowel syndrome)   . Kidney cyst, acquired   . Migraine    "probably monthly" (07/22/2014)  . 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 Pain in lower limb 03/02/2014  . Pancreatitis     Past Surgical History:  Procedure Laterality Date  . CARPAL TUNNEL RELEASE Right 2008  . COLONOSCOPY     IBS  . CYST EXCISION Right 2015   "2nd toe"  . KNEE ARTHROSCOPY Right 1992  . LASIK    . REFRACTIVE SURGERY Bilateral 2013  . SHOULDER SURGERY  10/2016  . UPPER GASTROINTESTINAL ENDOSCOPY    . UPPER GI ENDOSCOPY     chest pain  . WRIST FRACTURE SURGERY Right 2008   "got plate and screws"    Social History   Socioeconomic History  . Marital status: Married    Spouse name: Not on file  . Number of children: 2  . Years of education: Not on file  . Highest education level: Not on file  Occupational History  . Not on file  Tobacco Use  . Smoking  status: Never Smoker  . Smokeless tobacco: Never Used  Substance and Sexual Activity  . Alcohol use: Yes    Comment: 5-6 drinks once a week  . Drug use: No  . Sexual activity: Yes  Other Topics Concern  . 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 Difficulty of Paying Living Expenses: Not on file  Food Insecurity:   . Worried About Charity fundraiser in the Last Year: Not on file  . Ran Out of Food in the Last Year: Not on file  Transportation Needs:   . Lack of Transportation (Medical): Not on file  . Lack of Transportation (Non-Medical): Not on file  Physical Activity:   . Days of  Exercise per Week: Not on file  . Minutes of Exercise per Session: Not on file  Stress:   . Feeling of Stress : Not on file  Social Connections:   . Frequency of Communication with Friends and Family: Not on file  . Frequency of Social Gatherings with Friends and Family: Not on file  . Attends Religious Services: Not on file  . Active Member of Clubs or Organizations: Not on file  . Attends Archivist Meetings: Not on file  . Marital Status: Not on file    Family History  Problem Relation Age of Onset  . Hypertension Mother   . Hyperlipidemia Mother   . Depression Mother   . Hypertension Father   . Alzheimer's disease Maternal Grandmother   . Alcohol abuse Maternal Grandfather   . Alzheimer's disease Paternal Grandmother   . Alcohol abuse Paternal Grandfather   . Depression Sister   . Colon cancer Neg Hx     Review of Systems  Constitutional: Negative for chills and fever.  Respiratory: Negative for cough, shortness of breath and wheezing.   Cardiovascular: Positive for palpitations (occ, transient). Negative for chest pain and leg swelling.  Neurological: Negative for dizziness, light-headedness and headaches.       Objective:   Vitals:   12/02/19 0757  BP: (!) 146/92  Pulse: 81  Resp: 16  Temp: 98.1 F (36.7 C)  SpO2: 99%   BP Readings from Last 3 Encounters:  12/02/19 (!) 146/92  05/28/19 (!) 142/96  09/04/18 (!) 142/86   Wt Readings from Last 3 Encounters:  12/02/19 226 lb (102.5 kg)  05/28/19 213 lb (96.6 kg)  09/04/18 220 lb 6.4 oz (100 kg)   Body mass index is 30.65 kg/m.   Physical Exam    Constitutional: Appears well-developed and well-nourished. No distress.  HENT:  Head: Normocephalic and atraumatic.  Neck: Neck supple. No tracheal deviation present. No thyromegaly present.  No cervical lymphadenopathy Cardiovascular: Normal rate, regular rhythm and normal heart sounds.   No murmur heard. No carotid bruit .  No  edema Pulmonary/Chest: Effort normal and breath sounds normal. No respiratory distress. No has no wheezes. No rales.  Skin: Skin is warm and dry. Not diaphoretic.  Psychiatric: Normal mood and affect. Behavior is normal.      Assessment & Plan:    See Problem List for Assessment and Plan of chronic medical problems.    This visit occurred during the SARS-CoV-2 public health emergency.  Safety protocols were in place, including  screening questions prior to the visit, additional usage of staff PPE, and extensive cleaning of exam room while observing appropriate contact time as indicated for disinfecting solutions.

## 2019-11-30 NOTE — Patient Instructions (Addendum)
  Blood work was ordered.    Flu immunization administered today.     Medications reviewed and updated.  Changes include :   Stop losartan and start losartan-hctz 100-12.5 mg daily  Your prescription(s) have been submitted to your pharmacy. Please take as directed and contact our office if you believe you are having problem(s) with the medication(s).     Please followup in 6 months

## 2019-12-02 ENCOUNTER — Other Ambulatory Visit: Payer: Self-pay

## 2019-12-02 ENCOUNTER — Encounter: Payer: Self-pay | Admitting: Internal Medicine

## 2019-12-02 ENCOUNTER — Ambulatory Visit (INDEPENDENT_AMBULATORY_CARE_PROVIDER_SITE_OTHER): Payer: 59 | Admitting: Internal Medicine

## 2019-12-02 VITALS — BP 146/92 | HR 81 | Temp 98.1°F | Resp 16 | Ht 72.0 in | Wt 221.0 lb

## 2019-12-02 DIAGNOSIS — F1010 Alcohol abuse, uncomplicated: Secondary | ICD-10-CM

## 2019-12-02 DIAGNOSIS — Z23 Encounter for immunization: Secondary | ICD-10-CM

## 2019-12-02 DIAGNOSIS — F3289 Other specified depressive episodes: Secondary | ICD-10-CM

## 2019-12-02 DIAGNOSIS — I10 Essential (primary) hypertension: Secondary | ICD-10-CM | POA: Diagnosis not present

## 2019-12-02 DIAGNOSIS — K219 Gastro-esophageal reflux disease without esophagitis: Secondary | ICD-10-CM | POA: Diagnosis not present

## 2019-12-02 DIAGNOSIS — R739 Hyperglycemia, unspecified: Secondary | ICD-10-CM

## 2019-12-02 DIAGNOSIS — E781 Pure hyperglyceridemia: Secondary | ICD-10-CM

## 2019-12-02 LAB — COMPREHENSIVE METABOLIC PANEL
ALT: 106 U/L — ABNORMAL HIGH (ref 0–53)
AST: 52 U/L — ABNORMAL HIGH (ref 0–37)
Albumin: 4.4 g/dL (ref 3.5–5.2)
Alkaline Phosphatase: 40 U/L (ref 39–117)
BUN: 15 mg/dL (ref 6–23)
CO2: 27 mEq/L (ref 19–32)
Calcium: 9.4 mg/dL (ref 8.4–10.5)
Chloride: 104 mEq/L (ref 96–112)
Creatinine, Ser: 1.08 mg/dL (ref 0.40–1.50)
GFR: 74.2 mL/min (ref >60.00)
Glucose, Bld: 101 mg/dL — ABNORMAL HIGH (ref 70–99)
Potassium: 3.8 mEq/L (ref 3.5–5.1)
Sodium: 141 mEq/L (ref 135–145)
Total Bilirubin: 0.7 mg/dL (ref 0.2–1.2)
Total Protein: 7.4 g/dL (ref 6.0–8.3)

## 2019-12-02 LAB — LIPID PANEL
Cholesterol: 185 mg/dL (ref 0–200)
HDL: 41.5 mg/dL (ref >39.00)
LDL Cholesterol: 118 mg/dL — ABNORMAL HIGH (ref 0–99)
NonHDL: 143.03
Total CHOL/HDL Ratio: 4
Triglycerides: 124 mg/dL (ref 0.0–149.0)
VLDL: 24.8 mg/dL (ref 0.0–40.0)

## 2019-12-02 MED ORDER — PANTOPRAZOLE SODIUM 40 MG PO TBEC: 40.0000 mg | DELAYED_RELEASE_TABLET | Freq: Every day | ORAL | 1 refills | Status: DC

## 2019-12-02 MED ORDER — CILIDINIUM-CHLORDIAZEPOXIDE 2.5-5 MG PO CAPS: 1.0000 | ORAL_CAPSULE | Freq: Every day | ORAL | 1 refills | Status: DC

## 2019-12-02 MED ORDER — LOSARTAN POTASSIUM-HCTZ 100-12.5 MG PO TABS: 1.0000 | ORAL_TABLET | Freq: Every day | ORAL | 3 refills | Status: DC

## 2019-12-02 MED ORDER — FENOFIBRATE 145 MG PO TABS: 145.0000 mg | ORAL_TABLET | Freq: Every day | ORAL | 1 refills | Status: DC

## 2019-12-02 NOTE — Assessment & Plan Note (Signed)
No longer taking sertraline Does not feel he needs anything at this time - denies depression and anxiety Will monitor

## 2019-12-02 NOTE — Addendum Note (Signed)
Addended by: Delice Bison E on: 12/02/2019 09:02 AM   Modules accepted: Orders

## 2019-12-02 NOTE — Assessment & Plan Note (Signed)
Check a1c Low sugar / carb diet Stressed regular exercise   

## 2019-12-02 NOTE — Assessment & Plan Note (Signed)
Drinking less than he has in the past Drinking less when he does drink

## 2019-12-02 NOTE — Assessment & Plan Note (Signed)
Has intermittent GERD, takes Tums on occasion Overall controlled Continue daily medication

## 2019-12-02 NOTE — Assessment & Plan Note (Signed)
Chronic Not controlled Stressed regular exercise, discussed weight loss Compliant with a low salt diet Stop losartan - start hyzaar 100-12.5 mg daily Continue amlodipine cmp

## 2019-12-02 NOTE — Assessment & Plan Note (Signed)
Check lipid panel  Continue daily fenofibrate Regular exercise and healthy diet encouraged  

## 2019-12-03 ENCOUNTER — Encounter: Payer: Self-pay | Admitting: Internal Medicine

## 2020-02-24 ENCOUNTER — Encounter: Payer: Self-pay | Admitting: Internal Medicine

## 2020-04-23 ENCOUNTER — Other Ambulatory Visit: Payer: Self-pay | Admitting: Internal Medicine

## 2020-05-30 NOTE — Patient Instructions (Addendum)
  Blood work was ordered.     Medications reviewed and updated.  Changes include :   Increase BP losartan-hctz  medication to 100-25 mg daily  Your prescription(s) have been submitted to your pharmacy. Please take as directed and contact our office if you believe you are having problem(s) with the medication(s).  A referral was ordered for cardiology and sports medicine.      Someone will call you to schedule this.    Please followup in 6 months

## 2020-05-30 NOTE — Progress Notes (Signed)
Subjective:    Patient ID: Christopher [name], male    DOB: 1976/02/02, 44 y.o.   MRN: [id number]  HPI The patient is here for follow up of their chronic medical problems, including htn, GERD, hypertriglyceridemia, fatty liver  He usually remembers to take his medication during the week, but often forgets to take his meds on the weekend.  He feels his short term memory is not good.  He puts things down and can not remember where it is.  He does not have any difficulty doing his job.  Heart flutters a couple of times a week. It comes and goes quickly.  Last Wednesday he had tightness in his chest.  He was at work and the chest tightness lasted 30-45 minutes.  He was sitting and walking around and neither made it better/worse.  He denies any associated SOB, lightheadedness, palps, diaphoresis and nausea. No other episodes.    Increased lower back pain.  It is better when walking, but feels it laying or standing.  He denies radiating, spasms and N/T.      Medications and allergies reviewed with patient and updated if appropriate.  Patient Active Problem List   Diagnosis Date Noted  . Memory changes 05/28/2019  . Skin sensation disturbance 05/28/2019  . Hyperglycemia 05/27/2019  . History of gout 08/23/2017  . Erectile dysfunction 08/23/2017  . History of pancreatitis 02/16/2017  . Skin lesions 08/06/2015  . Renal mass, right 07/31/2014  . Alcohol abuse 07/22/2014  . Fatty liver 07/22/2014  . Equinus deformity of foot, acquired 03/02/2014  . 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 ALLERGIC RHINITIS DUE TO POLLEN 02/14/2010  . Essential hypertension 03/09/2008  . Hypertriglyceridemia 08/16/2007  . GERD 08/16/2007  . IBS (irritable bowel syndrome) 08/15/2007    Current Outpatient Medications on File Prior to Visit  Medication Sig Dispense Refill  . amLODipine (NORVASC) 10 MG tablet TAKE 1 TABLET BY MOUTH  DAILY 90 tablet 0  . clidinium-chlordiazePOXIDE (LIBRAX) 5-2.5 MG capsule TAKE 1  CAPSULE BY MOUTH  DAILY 90 capsule 0  . fenofibrate (TRICOR) 145 MG tablet Take 1 tablet (145 mg total) by mouth daily. 90 tablet 1  . Multiple Vitamin (MULTIVITAMIN WITH MINERALS) TABS tablet Take 1 tablet by mouth daily.    . pantoprazole (PROTONIX) 40 MG tablet Take 1 tablet (40 mg total) by mouth daily. 90 tablet 1   No current facility-administered medications on file prior to visit.    Past Medical History:  Diagnosis Date  . GERD (gastroesophageal reflux disease)   . H/O hiatal hernia   . Hepatic steatosis   . Hyperlipidemia   . Hypertension   . IBS (irritable bowel syndrome)   . Kidney cyst, acquired   . Migraine    "probably monthly" (07/22/2014)  . 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 Pain in lower limb 03/02/2014  . Pancreatitis     Past Surgical History:  Procedure Laterality Date  . CARPAL TUNNEL RELEASE Right 2008  . COLONOSCOPY     IBS  . CYST EXCISION Right 2015   "2nd toe"  . KNEE ARTHROSCOPY Right 1992  . LASIK    . REFRACTIVE SURGERY Bilateral 2013  . SHOULDER SURGERY  10/2016  . UPPER GASTROINTESTINAL ENDOSCOPY    . UPPER GI ENDOSCOPY     chest pain  . WRIST FRACTURE SURGERY Right 2008   "got plate and screws"    Social History   Socioeconomic History  . Marital status: Married    Spouse name:  Not on file  . Number of children: 2  . Years of education: Not on file  . Highest education level: Not on file  Occupational History  . Not on file  Tobacco Use  . Smoking status: Never Smoker  . Smokeless tobacco: Never Used  Substance and Sexual Activity  . Alcohol use: Yes    Comment: 5-6 drinks once a week  . Drug use: No  . Sexual activity: Yes  Other Topics Concern  . 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 Difficulty of Paying Living  Expenses:   Food Insecurity:   . Worried About Charity fundraiser in the Last Year:   . Arboriculturist in the Last Year:   Transportation Needs:   . Film/video editor (Medical):   Marland Kitchen Lack of Transportation (Non-Medical):   Physical Activity:   . Days of Exercise per Week:   . Minutes of Exercise per Session:   Stress:   . Feeling of Stress :   Social Connections:   . Frequency of Communication with Friends and Family:   . Frequency of Social Gatherings with Friends and Family:   . Attends Religious Services:   . Active Member of Clubs or Organizations:   . Attends Archivist Meetings:   Marland Kitchen Marital Status:     Family History  Problem Relation Age of Onset  . Hypertension Mother   . Hyperlipidemia Mother   . Depression Mother   . Hypertension Father   . Alzheimer's disease Maternal Grandmother   . Alcohol abuse Maternal Grandfather   . Alzheimer's disease Paternal Grandmother   . Alcohol abuse Paternal Grandfather   . Depression Sister   . Colon cancer Neg Hx     Review of Systems  Constitutional: Negative for chills and fever.  Respiratory: Negative for cough, shortness of breath and wheezing.   Cardiovascular: Positive for chest pain (episode of chest tightness) and palpitations. Negative for leg swelling.  Gastrointestinal:       GERD occ - controlled  Neurological: Positive for headaches (? related to poor hydration). Negative for dizziness and light-headedness.       Objective:   Vitals:   06/01/20 0810  BP: 140/88  Pulse: 69  Temp: 97.7 F (36.5 C)  SpO2: 99%   BP Readings from Last 3 Encounters:  06/01/20 140/88  12/02/19 (!) 146/92  05/28/19 (!) 142/96   Wt Readings from Last 3 Encounters:  06/01/20 224 lb (101.6 kg)  12/02/19 221 lb (100.2 kg)  05/28/19 213 lb (96.6 kg)   Body mass index is 30.38 kg/m.   Physical Exam    Constitutional: Appears well-developed and well-nourished. No distress.  HENT:  Head: Normocephalic and  atraumatic.  Neck: Neck supple. No tracheal deviation present. No thyromegaly present.  No cervical lymphadenopathy Cardiovascular: Normal rate, regular rhythm and normal heart sounds.   No murmur heard. No carotid bruit .  No edema Pulmonary/Chest: Effort normal and breath sounds normal. No respiratory distress. No has no wheezes. No rales.  Skin: Skin is warm and dry. Not diaphoretic.  Psychiatric: Normal mood and affect. Behavior is normal.      Assessment & Plan:    See Problem List for Assessment  and Plan of chronic medical problems.    This visit occurred during the SARS-CoV-2 public health emergency.  Safety protocols were in place, including screening questions prior to the visit, additional usage of staff PPE, and extensive cleaning of exam room while observing appropriate contact time as indicated for disinfecting solutions.

## 2020-06-01 ENCOUNTER — Other Ambulatory Visit: Payer: Self-pay

## 2020-06-01 ENCOUNTER — Ambulatory Visit: Payer: 59 | Admitting: Internal Medicine

## 2020-06-01 ENCOUNTER — Encounter: Payer: Self-pay | Admitting: Internal Medicine

## 2020-06-01 VITALS — BP 140/88 | HR 69 | Temp 97.7°F | Ht 72.0 in | Wt 224.0 lb

## 2020-06-01 DIAGNOSIS — K219 Gastro-esophageal reflux disease without esophagitis: Secondary | ICD-10-CM | POA: Diagnosis not present

## 2020-06-01 DIAGNOSIS — K760 Fatty (change of) liver, not elsewhere classified: Secondary | ICD-10-CM

## 2020-06-01 DIAGNOSIS — I10 Essential (primary) hypertension: Secondary | ICD-10-CM | POA: Diagnosis not present

## 2020-06-01 DIAGNOSIS — M545 Low back pain, unspecified: Secondary | ICD-10-CM

## 2020-06-01 DIAGNOSIS — R739 Hyperglycemia, unspecified: Secondary | ICD-10-CM

## 2020-06-01 DIAGNOSIS — R413 Other amnesia: Secondary | ICD-10-CM

## 2020-06-01 DIAGNOSIS — R0789 Other chest pain: Secondary | ICD-10-CM

## 2020-06-01 DIAGNOSIS — G8929 Other chronic pain: Secondary | ICD-10-CM

## 2020-06-01 DIAGNOSIS — R002 Palpitations: Secondary | ICD-10-CM

## 2020-06-01 DIAGNOSIS — E781 Pure hyperglyceridemia: Secondary | ICD-10-CM | POA: Diagnosis not present

## 2020-06-01 LAB — COMPREHENSIVE METABOLIC PANEL
ALT: 73 U/L — ABNORMAL HIGH (ref 0–53)
AST: 40 U/L — ABNORMAL HIGH (ref 0–37)
Albumin: 4.5 g/dL (ref 3.5–5.2)
Alkaline Phosphatase: 45 U/L (ref 39–117)
BUN: 11 mg/dL (ref 6–23)
CO2: 27 mEq/L (ref 19–32)
Calcium: 9.5 mg/dL (ref 8.4–10.5)
Chloride: 104 mEq/L (ref 96–112)
Creatinine, Ser: 1.12 mg/dL (ref 0.40–1.50)
GFR: 70.99 mL/min (ref >60.00)
Glucose, Bld: 109 mg/dL — ABNORMAL HIGH (ref 70–99)
Potassium: 4 mEq/L (ref 3.5–5.1)
Sodium: 142 mEq/L (ref 135–145)
Total Bilirubin: 0.6 mg/dL (ref 0.2–1.2)
Total Protein: 7.4 g/dL (ref 6.0–8.3)

## 2020-06-01 LAB — CBC WITH DIFFERENTIAL/PLATELET
Basophils Absolute: 0.1 10*3/uL (ref 0.0–0.1)
Basophils Relative: 1 % (ref 0.0–3.0)
Eosinophils Absolute: 0.3 10*3/uL (ref 0.0–0.7)
Eosinophils Relative: 6.2 % — ABNORMAL HIGH (ref 0.0–5.0)
HCT: 41 % (ref 39.0–52.0)
Hemoglobin: 14.2 g/dL (ref 13.0–17.0)
Lymphocytes Relative: 21.1 % (ref 12.0–46.0)
Lymphs Abs: 1.1 10*3/uL (ref 0.7–4.0)
MCHC: 34.7 g/dL (ref 30.0–36.0)
MCV: 87.4 fl (ref 78.0–100.0)
Monocytes Absolute: 0.6 10*3/uL (ref 0.1–1.0)
Monocytes Relative: 11.6 % (ref 3.0–12.0)
Neutro Abs: 3.2 10*3/uL (ref 1.4–7.7)
Neutrophils Relative %: 60.1 % (ref 43.0–77.0)
Platelets: 335 10*3/uL (ref 150.0–400.0)
RBC: 4.69 Mil/uL (ref 4.22–5.81)
RDW: 12.8 % (ref 11.5–15.5)
WBC: 5.3 10*3/uL (ref 4.0–10.5)

## 2020-06-01 LAB — TSH: TSH: 1.61 u[IU]/mL (ref 0.35–4.50)

## 2020-06-01 LAB — HEMOGLOBIN A1C: Hgb A1c MFr Bld: 5.3 % (ref 4.6–6.5)

## 2020-06-01 LAB — LIPID PANEL
Cholesterol: 160 mg/dL (ref 0–200)
HDL: 37 mg/dL — ABNORMAL LOW (ref >39.00)
LDL Cholesterol: 94 mg/dL (ref 0–99)
NonHDL: 122.94
Total CHOL/HDL Ratio: 4
Triglycerides: 146 mg/dL (ref 0.0–149.0)
VLDL: 29.2 mg/dL (ref 0.0–40.0)

## 2020-06-01 LAB — VITAMIN B12: Vitamin B-12: 541 pg/mL (ref 211–911)

## 2020-06-01 MED ORDER — LOSARTAN POTASSIUM-HCTZ 100-25 MG PO TABS: 1.0000 | ORAL_TABLET | Freq: Every day | ORAL | 3 refills | Status: DC

## 2020-06-01 MED ORDER — HYDROCHLOROTHIAZIDE 12.5 MG PO TABS: 12.5000 mg | ORAL_TABLET | Freq: Every day | ORAL | 0 refills | Status: DC

## 2020-06-01 NOTE — Assessment & Plan Note (Signed)
Chronic Check lipid panel  Continue daily fenofibrate Regular exercise and healthy diet encouraged  

## 2020-06-01 NOTE — Assessment & Plan Note (Addendum)
Chronic CMP today Has decreased his alcohol intake We have discussed in the past risk of developing cirrhosis, especially with alcohol intake

## 2020-06-01 NOTE — Assessment & Plan Note (Signed)
Concerned about short-term memory-has brought this up in the past Will recheck TSH, B12 I do not think he has early memory issues, but some of his symptoms he has noticed may be from multitasking or increased stress We will monitor

## 2020-06-01 NOTE — Assessment & Plan Note (Signed)
We will check A1c ?

## 2020-06-01 NOTE — Assessment & Plan Note (Signed)
Chronic BP not ideally controlled Continue amlodipine 10 mg daily Increase losartan-hydrochlorothiazide from 100-12.5 mg daily to 100-25 mg daily Low-sodium diet cmp

## 2020-06-01 NOTE — Assessment & Plan Note (Signed)
Chronic GERD controlled Continue daily medication  

## 2020-06-15 ENCOUNTER — Encounter: Payer: Self-pay | Admitting: Family Medicine

## 2020-06-15 ENCOUNTER — Other Ambulatory Visit: Payer: Self-pay

## 2020-06-15 ENCOUNTER — Ambulatory Visit (INDEPENDENT_AMBULATORY_CARE_PROVIDER_SITE_OTHER): Payer: 59

## 2020-06-15 ENCOUNTER — Ambulatory Visit: Payer: 59 | Admitting: Family Medicine

## 2020-06-15 VITALS — BP 122/82 | HR 66 | Ht 72.0 in | Wt 230.2 lb

## 2020-06-15 DIAGNOSIS — G8929 Other chronic pain: Secondary | ICD-10-CM

## 2020-06-15 DIAGNOSIS — M545 Low back pain, unspecified: Secondary | ICD-10-CM

## 2020-06-15 NOTE — Progress Notes (Signed)
   Subjective:    I'm seeing this patient as a consultation for:  Dr. [name]. Note will be routed back to referring provider/PCP.  CC: Low back pain  I, Molly Weber, LAT, ATC, am serving as scribe for Dr. Lynne Leader.  HPI: Pt is a 44 y/o male presenting w/ c/o chronic low back pain.  He locates his pain to midline lumbar spine.  He denies any injury.  He is done a little bit of stretching on his own but not much treatment yet.  Radiating pain: no LE numbness/tingling: no LE weakness: no Aggravating factors: prolonged standing; getting out of bed first time in the morning Treatments tried: chiro in the past  Past medical history, Surgical history, Family history, Social history, Allergies, and medications have been entered into the medical record, reviewed.   Review of Systems: No new headache, visual changes, nausea, vomiting, diarrhea, constipation, dizziness, abdominal pain, skin rash, fevers, chills, night sweats, weight loss, swollen lymph nodes, body aches, joint swelling, muscle aches, chest pain, shortness of breath, mood changes, visual or auditory hallucinations.   Objective:    Vitals:   06/15/20 0816  BP: 122/82  Pulse: 66  SpO2: 96%   General: Well Developed, well nourished, and in no acute distress.  Neuro/Psych: Alert and oriented x3, extra-ocular muscles intact, able to move all 4 extremities, sensation grossly intact. Skin: Warm and dry, no rashes noted.  Respiratory: Not using accessory muscles, speaking in full sentences, trachea midline.  Cardiovascular: Pulses palpable, no extremity edema. Abdomen: Does not appear distended. MSK: L-spine normal-appearing Nontender. Normal motion. Allergic strength reflexes and sensation are intact and equal bilaterally.  Lab and Radiology Results  X-ray images obtained today personally and independently reviewed.  X-ray images compared to images from CT scan abdomen and pelvis May 2017. Anterior osteophyte present at  L3-4.  Mild facet DJD L5-S1.  No acute fractures or malalignment. Await formal radiology review  Impression and Recommendations:    Assessment and Plan: 44 y.o. male with chronic low back pain.  Plan for trial of physical therapy and recheck about 6 weeks after starting physical therapy.  Return sooner if needed.  Next step if not improved enough would be likely MRI L-spine to further characterize cause of back pain.  Could also consider SI joint injections in clinic.Marland Kitchen  PDMP not reviewed this encounter. Orders Placed This Encounter  Procedures  . DG Lumbar Spine 2-3 Views    Standing Status:   Future    Number of Occurrences:   1    Standing Expiration Date:   06/15/2021    Order Specific Question:   Reason for Exam (SYMPTOM  OR DIAGNOSIS REQUIRED)    Answer:   eval lumbar pain    Order Specific Question:   Preferred imaging location?    Answer:   [facility]    Order Specific Question:   Radiology Contrast Protocol - do NOT remove file path    Answer:   [email or web address]  . Ambulatory referral to Physical Therapy    Referral Priority:   Routine    Referral Type:   Physical Medicine    Referral Reason:   Specialty Services Required    Requested Specialty:   Physical Therapy   No orders of the defined types were placed in this encounter.   Discussed warning signs or symptoms. Please see discharge instructions. Patient expresses understanding.   The above documentation has been reviewed and is accurate and complete Lynne Leader, M.D.

## 2020-06-15 NOTE — Patient Instructions (Signed)
Thank you for coming in today. Plan for xray today.  Start PT.  Let me know if you have a problem.  Recheck about 6 weeks after starting PT.  Return or contact me sooner if needed.

## 2020-06-16 NOTE — Progress Notes (Signed)
Arthritis changes present at L3-4 otherwise normal appearing to radiology.

## 2020-06-20 ENCOUNTER — Encounter: Payer: Self-pay | Admitting: Family Medicine

## 2020-06-22 MED ORDER — CYCLOBENZAPRINE HCL 10 MG PO TABS
10.0000 mg | ORAL_TABLET | Freq: Three times a day (TID) | ORAL | 0 refills | Status: DC | PRN
Start: 2020-06-22 — End: 2021-02-09

## 2020-06-30 ENCOUNTER — Ambulatory Visit: Payer: 59 | Admitting: Rehabilitative and Restorative Service Providers"

## 2020-07-04 NOTE — Progress Notes (Signed)
Cardiology Office Note:   Date:  07/08/2020  NAME:  Christopher [name]    MRN: [id number] DOB:  November 02, 1976   PCP:  [name], MD  Cardiologist:  No primary care provider on file.   Referring MD: [name], MD   Chief Complaint  Patient presents with  . Chest Pain   History of Present Illness:   Christopher [name] is a 44 y.o. male with a hx of HTN, HLD, fatty liver who is being seen today for the evaluation of chest pain/palpitations at the request of Burns, Claudina Lick, MD.  TSH 1.61.  He reports for the past 6 months he has had 2-3 episodes of what he describes as chest tightness.  The last episode was 3 weeks ago.  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  He was sitting at his desk and had intense chest tightness.  He says the pain lasted for 30 minutes.  Nothing made it worse or better.  No alleviating or triggering factors reported.  No recent food intake.  He reports the pain went away without any intervention.  He reports medical history of hypertension hyperlipidemia as well as obesity.  He also reports a strong family history of cardiovascular disease in his maternal uncle who started having heart attacks in his mid 82s.  He also reports episodes of fluttering in his chest.  He reports he get these 2 times per month.  They last seconds.  He has reduced his caffeine consumption and noticed some improvement.  The symptoms do not last long enough to be bothersome.  He does not exercise.  His blood pressure is 130/88.  Well-controlled on current medications.  Lipid profile below.  Seems to be stable.  EKG today is normal.  Cardiovascular exam is normal.  Problem List 1. HTN 2. HLD -T chol 160, HDL 37, LDL 94, TG 146 -A1c 5.3 3. Fatty liver   Past Medical History: Past Medical History:  Diagnosis Date  . GERD (gastroesophageal reflux disease)   . H/O hiatal hernia   . Hepatic steatosis   . Hyperlipidemia   . Hypertension   . IBS (irritable bowel syndrome)   . Kidney  cyst, acquired   . Migraine    "probably monthly" (07/22/2014)  . 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 Pain in lower limb 03/02/2014  . Pancreatitis     Past Surgical History: Past Surgical History:  Procedure Laterality Date  . CARPAL TUNNEL RELEASE Right 2008  . COLONOSCOPY     IBS  . CYST EXCISION Right 2015   "2nd toe"  . KNEE ARTHROSCOPY Right 1992  . LASIK    . REFRACTIVE SURGERY Bilateral 2013  . SHOULDER SURGERY  10/2016  . UPPER GASTROINTESTINAL ENDOSCOPY    . UPPER GI ENDOSCOPY     chest pain  . WRIST FRACTURE SURGERY Right 2008   "got plate and screws"    Current Medications: Current Meds  Medication Sig  . amLODipine (NORVASC) 10 MG tablet TAKE 1 TABLET BY MOUTH  DAILY  . clidinium-chlordiazePOXIDE (LIBRAX) 5-2.5 MG capsule TAKE 1 CAPSULE BY MOUTH  DAILY  . cyclobenzaprine (FLEXERIL) 10 MG tablet Take 1 tablet (10 mg total) by mouth 3 (three) times daily as needed for muscle spasms.  . fenofibrate (TRICOR) 145 MG tablet Take 1 tablet (145 mg total) by mouth daily.  Marland Kitchen losartan-hydrochlorothiazide (HYZAAR) 100-25 MG tablet Take 1 tablet by mouth daily.  . Multiple Vitamin (MULTIVITAMIN WITH MINERALS) TABS tablet  Take 1 tablet by mouth daily.  . pantoprazole (PROTONIX) 40 MG tablet Take 1 tablet (40 mg total) by mouth daily.     Allergies:    Morphine and related   Social History: Social History   Socioeconomic History  . Marital status: Married    Spouse name: Not on file  . Number of children: 2  . Years of education: Not on file  . Highest education level: Not on file  Occupational History  . Not on file  Tobacco Use  . Smoking status: Never Smoker  . Smokeless tobacco: Never Used  Substance and Sexual Activity  . Alcohol use: Yes    Comment: 5-6 drinks once a week  . Drug use: No  . Sexual activity: Yes  Other Topics Concern  . Not on file  Social History Narrative   Married, second wife, daughter born in 38, son born in 2000   He is a Transport planner for the city of [place]   3 caffeinated beverages daily   Social Determinants of Health   Financial Resource Strain:   . Difficulty of Paying Living Expenses:   Food Insecurity:   . Worried About Charity fundraiser in the Last Year:   . Arboriculturist in the Last Year:   Transportation Needs:   . Film/video editor (Medical):   Marland Kitchen Lack of Transportation (Non-Medical):   Physical Activity:   . Days of Exercise per Week:   . Minutes of Exercise per Session:   Stress:   . Feeling of Stress :   Social Connections:   . Frequency of Communication with Friends and Family:   . Frequency of Social Gatherings with Friends and Family:   . Attends Religious Services:   . Active Member of Clubs or Organizations:   . Attends Archivist Meetings:   Marland Kitchen Marital Status:      Family History: The patient's family history includes Alcohol abuse in his maternal grandfather and paternal grandfather; Alzheimer's disease in his maternal grandmother and paternal grandmother; Depression in his mother and sister; Heart disease in his maternal grandfather and maternal uncle; Heart failure in his maternal grandfather; Hyperlipidemia in his mother; Hypertension in his father and mother. There is no history of Colon cancer.  ROS:   All other ROS reviewed and negative. Pertinent positives noted in the HPI.     EKGs/Labs/Other Studies Reviewed:   The following studies were personally reviewed by me today:  EKG:  EKG is ordered today.  The ekg ordered today demonstrates NSR 68 bpm, no acute STT changes, and was personally reviewed by me.   Recent Labs: 06/01/2020: ALT 73; BUN 11; Creatinine, Ser 1.12; Hemoglobin 14.2; Platelets 335.0; Potassium 4.0; Sodium 142; TSH 1.61   Recent Lipid Panel    Component Value Date/Time   CHOL 160 06/01/2020 0855   TRIG 146.0 06/01/2020 0855   TRIG 224 (HH) 11/14/2006 1016   HDL 37.00 (L) 06/01/2020 0855   CHOLHDL 4 06/01/2020 0855   VLDL 29.2  06/01/2020 0855   LDLCALC 94 06/01/2020 0855   LDLDIRECT 110.0 11/30/2016 1347    Physical Exam:   VS:  BP 130/88   Pulse 68   Temp 97.7 F (36.5 C)   Ht 6' (1.829 m)   Wt 228 lb 6.4 oz (103.6 kg)   SpO2 97%   BMI 30.98 kg/m    Wt Readings from Last 3 Encounters:  07/08/20 228 lb 6.4 oz (103.6 kg)  06/15/20 230 lb 3.2 oz (104.4 kg)  06/01/20 224 lb (101.6 kg)    General: Well nourished, well developed, in no acute distress Heart: Atraumatic, normal size  Eyes: PEERLA, EOMI  Neck: Supple, no JVD Endocrine: No thryomegaly Cardiac: Normal S1, S2; RRR; no murmurs, rubs, or gallops Lungs: Clear to auscultation bilaterally, no wheezing, rhonchi or rales  Abd: Soft, nontender, no hepatomegaly  Ext: No edema, pulses 2+ Musculoskeletal: No deformities, BUE and BLE strength normal and equal Skin: Warm and dry, no rashes   Neuro: Alert and oriented to person, place, time, and situation, CNII-XII grossly intact, no focal deficits  Psych: Normal mood and affect   ASSESSMENT:   Julus Kelley is a 44 y.o. male who presents for the following: 1. Other chest pain   2. Palpitations     PLAN:   1. Other chest pain -Atypical chest pain.  EKG is normal.  Cardiovascular exam is normal.  Recent thyroid studies are normal.  CVD risk factors include hypertension, hyperlipidemia and strong family history.  I have recommended a coronary CTA.  He will give [place] a BMP today.  100 mg of Toprol tartrate 2 hours before the scan.  We will also have a calcium score at the time of that scan and further risk stratify him.  We will hold on echocardiogram for now.  I will see him back in 3 months to discuss results of this.  2. Palpitations -Episodes of infrequent chest palpitations.  Last seconds.  Have decreased with reduced caffeine consumption.  We will keep an eye on this for now.  They seem to not be that bothersome currently.  Disposition: Return in about 3 months (around 10/08/2020).  Medication  Adjustments/Labs and Tests Ordered: Current medicines are reviewed at length with the patient today.  Concerns regarding medicines are outlined above.  No orders of the defined types were placed in this encounter.  No orders of the defined types were placed in this encounter.   Patient Instructions  Medication Instructions:  Take Metoprolol 100 mg two hours before the CT when scheduled.   *If you need a refill on your cardiac medications before your next appointment, please call your pharmacy*   Lab Work: BMET today   If you have labs (blood work) drawn today and your tests are completely normal, you will receive your results only by: Marland Kitchen MyChart Message (if you have MyChart) OR . A paper copy in the mail If you have any lab test that is abnormal or we need to change your treatment, we will call you to review the results.   Testing/Procedures: Your physician has requested that you have cardiac CT. Cardiac computed tomography (CT) is a painless test that uses an x-ray machine to take clear, detailed pictures of your heart. For further information please visit [email or web address]. Please follow instruction sheet as given.   Follow-Up: At [facility], you and your health needs are our priority.  As part of our continuing mission to provide you with exceptional heart care, we have created designated Provider Care Teams.  These Care Teams include your primary Cardiologist (physician) and Advanced Practice Providers (APPs -  Physician Assistants and Nurse Practitioners) who all work together to provide you with the care you need, when you need it.  We recommend signing up for the patient portal called "MyChart".  Sign up information is provided on this After Visit Summary.  MyChart is used to connect with patients for Virtual Visits (Telemedicine).  Patients are  able to view lab/test results, encounter notes, upcoming appointments, etc.  Non-urgent messages can be sent to your provider as  well.   To learn more about what you can do with MyChart, go to [email or web address].    Your next appointment:   3 month(s)  The format for your next appointment:   In Person  Provider:   Eleonore Chiquito, MD   Other Instructions Your cardiac CT will be scheduled at one of the below locations:   [facility] [address] [place], [place] [address] [phone]  If scheduled at [facility], please arrive at the [facility] main entrance of [facility] 30 minutes prior to test start time. Proceed to the [facility] Radiology Department (first floor) to check-in and test prep.  Please follow these instructions carefully (unless otherwise directed):  Hold all erectile dysfunction medications at least 3 days (72 hrs) prior to test.  On the Night Before the Test: . Be sure to Drink plenty of water. . Do not consume any caffeinated/decaffeinated beverages or chocolate 12 hours prior to your test. . Do not take any antihistamines 12 hours prior to your test.  On the Day of the Test: . Drink plenty of water. Do not drink any water within one hour of the test. . Do not eat any food 4 hours prior to the test. . You may take your regular medications prior to the test.  . Take metoprolol (Lopressor) two hours prior to test. . HOLD Furosemide/Hydrochlorothiazide morning of the test. . FEMALES- please wear underwire-free bra if available  After the Test: . Drink plenty of water. . After receiving IV contrast, you may experience a mild flushed feeling. This is normal. . On occasion, you may experience a mild rash up to 24 hours after the test. This is not dangerous. If this occurs, you can take Benadryl 25 mg and increase your fluid intake. . If you experience trouble breathing, this can be serious. If it is severe call 911 IMMEDIATELY. If it is mild, please call our office. . If you take any of these medications: Glipizide/Metformin, Avandament,  Glucavance, please do not take 48 hours after completing test unless otherwise instructed.   Once we have confirmed authorization from your insurance company, we will call you to set up a date and time for your test. Based on how quickly your insurance processes prior authorizations requests, please allow up to 4 weeks to be contacted for scheduling your Cardiac CT appointment. Be advised that routine Cardiac CT appointments could be scheduled as many as 8 weeks after your provider has ordered it.  For non-scheduling related questions, please contact the cardiac imaging nurse navigator should you have any questions/concerns: Marchia Bond, Cardiac Imaging Nurse Navigator Burley Saver, Interim Cardiac Imaging Nurse [facility] Direct Office Dial: [phone]   For scheduling needs, including cancellations and rescheduling, please call Vivien Rota at [phone], option 3.      Signed, Addison Naegeli. Audie Box, [place]  [address] [place], [place] [address] [phone]  07/08/2020 9:13 AM

## 2020-07-05 ENCOUNTER — Ambulatory Visit: Payer: 59 | Admitting: Physical Therapy

## 2020-07-05 ENCOUNTER — Other Ambulatory Visit: Payer: Self-pay

## 2020-07-05 ENCOUNTER — Encounter: Payer: Self-pay | Admitting: Physical Therapy

## 2020-07-05 DIAGNOSIS — M545 Low back pain, unspecified: Secondary | ICD-10-CM

## 2020-07-05 DIAGNOSIS — G8929 Other chronic pain: Secondary | ICD-10-CM

## 2020-07-05 DIAGNOSIS — M6281 Muscle weakness (generalized): Secondary | ICD-10-CM

## 2020-07-05 NOTE — Therapy (Signed)
[facility] [address] [place], [place], [address] Phone: [phone]   Fax:  [phone]  Physical Therapy Evaluation  Patient Details  Name: Christopher [name] MRN: [id number] Date of Birth: 1976/03/08 Referring Provider (PT): Gregor Hams, MD   Encounter Date: 07/05/2020   PT End of Session - 07/05/20 1429    Visit Number 1    Number of Visits 12    Date for PT Re-Evaluation 08/16/20    Authorization Type U[employer]    PT Start Time 6503    PT Stop Time 1230    PT Time Calculation (min) 45 min    Activity Tolerance Patient tolerated treatment well           Past Medical History:  Diagnosis Date  . GERD (gastroesophageal reflux disease)   . H/O hiatal hernia   . Hepatic steatosis   . Hyperlipidemia   . Hypertension   . IBS (irritable bowel syndrome)   . Kidney cyst, acquired   . Migraine    "probably monthly" (07/22/2014)  . 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 Pain in lower limb 03/02/2014  . Pancreatitis     Past Surgical History:  Procedure Laterality Date  . CARPAL TUNNEL RELEASE Right 2008  . COLONOSCOPY     IBS  . CYST EXCISION Right 2015   "2nd toe"  . KNEE ARTHROSCOPY Right 1992  . LASIK    . REFRACTIVE SURGERY Bilateral 2013  . SHOULDER SURGERY  10/2016  . UPPER GASTROINTESTINAL ENDOSCOPY    . UPPER GI ENDOSCOPY     chest pain  . WRIST FRACTURE SURGERY Right 2008   "got plate and screws"    There were no vitals filed for this visit.    Subjective Assessment - 07/05/20 1150    Subjective He relays constant nagging LBP over the last couple of years. It does not stop me from doing what I want to do but its there, I need to work on my core and posture. He does desk work mostly.    Pertinent History no significant PMH related to his LBP    Limitations Lifting;Standing;House hold activities    How long can you stand comfortably? 10    How long can you walk comfortably? not limited walking    Diagnostic tests Imaging: XR  "Minimal stable spondylosis at L3/L4.  No acute bony abnormality    Patient Stated Goals reduce pain, improve core strength    Currently in Pain? Yes    Pain Score 4     Pain Location Back    Pain Descriptors / Indicators Aching;Constant;Dull    Pain Type Chronic pain    Pain Radiating Towards denies    Aggravating Factors  standing    Pain Relieving Factors nothing    Multiple Pain Sites No              [facility] PT Assessment - 07/05/20 0001      Assessment   Medical Diagnosis M54.5,G89.29 (ICD-10-CM) - Chronic bilateral low back pain without sciatica    Referring Provider (PT) Gregor Hams, MD    Onset Date/Surgical Date --   chronic LBP for 2 years   Next MD Visit 08/05/20    Prior Therapy none      Precautions   Precautions None      Balance Screen   Has the patient fallen in the past 6 months No    Has the patient had a decrease in activity level because of a  fear of falling?  No    Is the patient reluctant to leave their home because of a fear of falling?  No      Home Ecologist residence      Prior Function   Level of Independence Independent    Vocation Full time employment    Vocation Requirements desk work    Leisure softball      Cognition   Overall Cognitive Status Within Functional Limits for tasks assessed      Sensation   Light Touch Appears Intact      Coordination   Gross Motor Movements are Fluid and Coordinated Yes      ROM / Strength   AROM / PROM / Strength AROM;Strength      AROM   Overall AROM Comments H.S length limited to about 70 deg due to tightness    AROM Assessment Site Lumbar    Lumbar Flexion WNL    Lumbar Extension WNL    Lumbar - Right Side Bend 50%    Lumbar - Left Side Bend 75%    Lumbar - Right Rotation 75%    Lumbar - Left Rotation 75%      Strength   Overall Strength Comments LE strength 5/5 MMT bilat, core strength 4/5 (can hold plank 30 seconds but significant instabiility noted after  20 sec)      Flexibility   Soft Tissue Assessment /Muscle Length --   tight H.S. and lumbar P.S bilat     Palpation   Spinal mobility WNL except at L4-5    Palpation comment denies any pain with lumbar palpation of PAM testing      Special Tests   Other special tests neg SLR test bilat, neg slump test bilat, neg quadrant testing bilat      Transfers   Transfers Independent with all Transfers      Ambulation/Gait   Gait Comments WNL                       Objective measurements completed on examination: See above findings.       [facility] Adult PT Treatment/Exercise - 07/05/20 0001      Modalities   Modalities Moist Heat      Moist Heat Therapy   Number Minutes Moist Heat 10 Minutes    Moist Heat Location Lumbar Spine   with HEP creation and review                 PT Education - 07/05/20 1429    Education Details HEP, POC, exam findings    Person(s) Educated Patient    Methods Explanation;Demonstration;Verbal cues;Handout    Comprehension Verbalized understanding;Need further instruction               PT Long Term Goals - 07/05/20 1447      PT LONG TERM GOAL #1   Title Pt will be I and compliant with HEP.    Time 6    Period Weeks    Status New    Target Date 08/16/20      PT LONG TERM GOAL #2   Title Pt will improve lumbar ROM to WNL.    Time 6    Period Weeks    Status New      PT LONG TERM GOAL #3   Title Pt will improve core strength to 5/5 but demonstrating holding a plank for 45 seconds without signs of instability.  Time 6    Period Weeks    Status New      PT LONG TERM GOAL #4   Title Pt will reduce overall pain by 50% with usual activity and ADL's.    Time 6    Period Weeks    Status New                  Plan - 07/05/20 1430    Clinical Impression Statement He presents with chronic LBP without radiculopathy and pain is localized centrally to L3-5. He does have minimal degenerative changes in that area on  XR. Overall he has great leg strength but has decreased core strength, lumbar hypomobility L3-5 and increased tightness in lumbar/hips. He will benefit from skilled PT to address his functional deficits.    Personal Factors and Comorbidities Past/Current Experience    Examination-Activity Limitations Lift;Stand;Stairs;Squat;Sleep;Carry    Examination-Participation Restrictions Cleaning;Community Activity;Yard Work    Stability/Clinical Decision Making Stable/Uncomplicated    Designer, jewellery Low    Rehab Potential Good    PT Frequency 2x / week   1-2   PT Duration 6 weeks    PT Treatment/Interventions ADLs/Self Care Home Management;Electrical Stimulation;Cryotherapy;Moist Heat;Iontophoresis 4mg /ml Dexamethasone;Traction;Ultrasound;Therapeutic activities;Therapeutic exercise;Neuromuscular re-education;Manual techniques;Joint Manipulations;Spinal Manipulations;Taping    PT Next Visit Plan review and update HEP PRN, begin lumbar stretching and strengthening, trial LAD or mechanical traction PRN.    PT Home Exercise Plan Access Code: [email or web address]    Consulted and Agree with Plan of Care Patient           Patient will benefit from skilled therapeutic intervention in order to improve the following deficits and impairments:  Decreased activity tolerance, Decreased mobility, Decreased strength, Difficulty walking, Postural dysfunction, Pain  Visit Diagnosis: Chronic bilateral low back pain without sciatica  Muscle weakness (generalized)     Problem List Patient Active Problem List   Diagnosis Date Noted  . Memory changes 05/28/2019  . Skin sensation disturbance 05/28/2019  . Hyperglycemia 05/27/2019  . History of gout 08/23/2017  . Erectile dysfunction 08/23/2017  . History of pancreatitis 02/16/2017  . Skin lesions 08/06/2015  . Renal mass, right 07/31/2014  . Alcohol abuse 07/22/2014  . Fatty liver 07/22/2014  . Equinus deformity of foot, acquired 03/02/2014  . Parotid mass  05/05/2013  . ALLERGIC RHINITIS DUE TO POLLEN 02/14/2010  . Essential hypertension 03/09/2008  . Hypertriglyceridemia 08/16/2007  . GERD 08/16/2007  . IBS (irritable bowel syndrome) 08/15/2007    Silvestre Mesi 07/05/2020, 2:52 PM  [facility] [address] [place], [place], [address] Phone: [phone]   Fax:  [phone]  Name: Christopher [name] MRN: [id number] Date of Birth: October 13, 1976

## 2020-07-05 NOTE — Patient Instructions (Signed)
Access Code: [facility] URL: [email or web address] Date: 07/05/2020 Prepared by: Elsie Ra  Exercises Seated Hamstring Stretch - 2 x daily - 6 x weekly - 1 sets - 2-3 reps - 30 hold Hooklying Single Knee to Chest Stretch - 2 x daily - 6 x weekly - 2 reps - 1 sets - 20 hold Child's Pose Stretch - 2 x daily - 6 x weekly - 3 sets - 30 hold Supine Bridge - 2 x daily - 6 x weekly - 10 reps - 1-2 sets - 5 hold Standing Sidebends - 2 x daily - 6 x weekly - 1 sets - 10 reps Bird Dog with Knee Taps - 2 x daily - 6 x weekly - 1 sets - 10 reps Standard Plank - 2 x daily - 6 x weekly - 3 reps - 1 sets - 20-30 sec hold

## 2020-07-08 ENCOUNTER — Encounter: Payer: Self-pay | Admitting: Cardiovascular Disease

## 2020-07-08 ENCOUNTER — Ambulatory Visit: Payer: 59 | Admitting: Cardiovascular Disease

## 2020-07-08 ENCOUNTER — Other Ambulatory Visit: Payer: Self-pay

## 2020-07-08 VITALS — BP 130/88 | HR 68 | Temp 97.7°F | Ht 72.0 in | Wt 228.4 lb

## 2020-07-08 DIAGNOSIS — R002 Palpitations: Secondary | ICD-10-CM

## 2020-07-08 DIAGNOSIS — R0789 Other chest pain: Secondary | ICD-10-CM | POA: Diagnosis not present

## 2020-07-08 LAB — BASIC METABOLIC PANEL
BUN/Creatinine Ratio: 11 (ref 9–20)
BUN: 11 mg/dL (ref 6–24)
CO2: 26 mmol/L (ref 20–29)
Calcium: 9.6 mg/dL (ref 8.7–10.2)
Chloride: 101 mmol/L (ref 96–106)
Creatinine, Ser: 0.99 mg/dL (ref 0.76–1.27)
GFR calc Af Amer: 107 mL/min/{1.73_m2} (ref >59)
GFR calc non Af Amer: 92 mL/min/{1.73_m2} (ref >59)
Glucose: 93 mg/dL (ref 65–99)
Potassium: 4.2 mmol/L (ref 3.5–5.2)
Sodium: 139 mmol/L (ref 134–144)

## 2020-07-08 MED ORDER — METOPROLOL TARTRATE 100 MG PO TABS
ORAL_TABLET | ORAL | 0 refills | Status: DC
Start: 2020-07-08 — End: 2020-08-03

## 2020-07-08 NOTE — Patient Instructions (Signed)
Medication Instructions:  Take Metoprolol 100 mg two hours before the CT when scheduled.   *If you need a refill on your cardiac medications before your next appointment, please call your pharmacy*   Lab Work: BMET today   If you have labs (blood work) drawn today and your tests are completely normal, you will receive your results only by: Marland Kitchen MyChart Message (if you have MyChart) OR . A paper copy in the mail If you have any lab test that is abnormal or we need to change your treatment, we will call you to review the results.   Testing/Procedures: Your physician has requested that you have cardiac CT. Cardiac computed tomography (CT) is a painless test that uses an x-ray machine to take clear, detailed pictures of your heart. For further information please visit [email or web address]. Please follow instruction sheet as given.   Follow-Up: At [facility], you and your health needs are our priority.  As part of our continuing mission to provide you with exceptional heart care, we have created designated Provider Care Teams.  These Care Teams include your primary Cardiologist (physician) and Advanced Practice Providers (APPs -  Physician Assistants and Nurse Practitioners) who all work together to provide you with the care you need, when you need it.  We recommend signing up for the patient portal called "MyChart".  Sign up information is provided on this After Visit Summary.  MyChart is used to connect with patients for Virtual Visits (Telemedicine).  Patients are able to view lab/test results, encounter notes, upcoming appointments, etc.  Non-urgent messages can be sent to your provider as well.   To learn more about what you can do with MyChart, go to [email or web address].    Your next appointment:   3 month(s)  The format for your next appointment:   In Person  Provider:   Eleonore Chiquito, MD   Other Instructions Your cardiac CT will be scheduled at one of the below locations:    [facility] [address] [place], [place] [address] [phone]  If scheduled at [facility], please arrive at the [facility] main entrance of [facility] 30 minutes prior to test start time. Proceed to the [facility] Radiology Department (first floor) to check-in and test prep.  Please follow these instructions carefully (unless otherwise directed):  Hold all erectile dysfunction medications at least 3 days (72 hrs) prior to test.  On the Night Before the Test: . Be sure to Drink plenty of water. . Do not consume any caffeinated/decaffeinated beverages or chocolate 12 hours prior to your test. . Do not take any antihistamines 12 hours prior to your test.  On the Day of the Test: . Drink plenty of water. Do not drink any water within one hour of the test. . Do not eat any food 4 hours prior to the test. . You may take your regular medications prior to the test.  . Take metoprolol (Lopressor) two hours prior to test. . HOLD Furosemide/Hydrochlorothiazide morning of the test. . FEMALES- please wear underwire-free bra if available  After the Test: . Drink plenty of water. . After receiving IV contrast, you may experience a mild flushed feeling. This is normal. . On occasion, you may experience a mild rash up to 24 hours after the test. This is not dangerous. If this occurs, you can take Benadryl 25 mg and increase your fluid intake. . If you experience trouble breathing, this can be serious. If it is severe call  911 IMMEDIATELY. If it is mild, please call our office. . If you take any of these medications: Glipizide/Metformin, Avandament, Glucavance, please do not take 48 hours after completing test unless otherwise instructed.   Once we have confirmed authorization from your insurance company, we will call you to set up a date and time for your test. Based on how quickly your insurance processes prior authorizations requests, please allow up to 4  weeks to be contacted for scheduling your Cardiac CT appointment. Be advised that routine Cardiac CT appointments could be scheduled as many as 8 weeks after your provider has ordered it.  For non-scheduling related questions, please contact the cardiac imaging nurse navigator should you have any questions/concerns: Marchia Bond, Cardiac Imaging Nurse Navigator Burley Saver, Interim Cardiac Imaging Nurse [place] and V[facility] Services Direct Office Dial: [phone]   For scheduling needs, including cancellations and rescheduling, please call Vivien Rota at [phone], option 3.

## 2020-07-09 ENCOUNTER — Encounter: Payer: Self-pay | Admitting: Physical Therapy

## 2020-07-09 ENCOUNTER — Ambulatory Visit: Payer: 59 | Admitting: Physical Therapy

## 2020-07-09 DIAGNOSIS — G8929 Other chronic pain: Secondary | ICD-10-CM

## 2020-07-09 DIAGNOSIS — M545 Low back pain, unspecified: Secondary | ICD-10-CM

## 2020-07-09 DIAGNOSIS — M6281 Muscle weakness (generalized): Secondary | ICD-10-CM

## 2020-07-09 NOTE — Therapy (Signed)
[facility] [address] [place], [place], [address] Phone: [phone]   Fax:  [phone]  Physical Therapy Treatment  Patient Details  Name: Christopher [name] MRN: [id number] Date of Birth: [birth year] Referring Provider (PT): Gregor Hams, MD   Encounter Date: 07/09/2020   PT End of Session - 07/09/20 1520    Visit Number 2    Number of Visits 12    Date for PT Re-Evaluation 08/16/20    Authorization Type UHC    PT Start Time 1520    PT Stop Time 1602    PT Time Calculation (min) 42 min    Activity Tolerance Patient tolerated treatment well           Past Medical History:  Diagnosis Date  . GERD (gastroesophageal reflux disease)   . H/O hiatal hernia   . Hepatic steatosis   . Hyperlipidemia   . Hypertension   . IBS (irritable bowel syndrome)   . Kidney cyst, acquired   . Migraine    "probably monthly" (07/22/2014)  . 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 Pain in lower limb 03/02/2014  . Pancreatitis     Past Surgical History:  Procedure Laterality Date  . CARPAL TUNNEL RELEASE Right 2008  . COLONOSCOPY     IBS  . CYST EXCISION Right 2015   "2nd toe"  . KNEE ARTHROSCOPY Right 1992  . LASIK    . REFRACTIVE SURGERY Bilateral 2013  . SHOULDER SURGERY  10/2016  . UPPER GASTROINTESTINAL ENDOSCOPY    . UPPER GI ENDOSCOPY     chest pain  . WRIST FRACTURE SURGERY Right 2008   "got plate and screws"    There were no vitals filed for this visit.   Subjective Assessment - 07/09/20 1521    Subjective " I am still doing about the same, still pretty stiff in the low back."    Diagnostic tests Imaging: XR "Minimal stable spondylosis at L3/L4.  No acute bony abnormality    Currently in Pain? Yes    Pain Score 4     Pain Location Back    Pain Orientation Right    Pain Descriptors / Indicators Aching                             [facility] Adult PT Treatment/Exercise - 07/09/20 0001      Exercises   Exercises Lumbar       Lumbar Exercises: Stretches   Single Knee to Chest Stretch 2 reps;30 seconds;Left;Right   with foot in chair   Single Knee to Chest Stretch Limitations  pt noted increased empahsis on low back    Hip Flexor Stretch Left;Right;1 rep;30 seconds    Other Lumbar Stretch Exercise childs pose stretch 2 x 30 sec   biasing to the L/R     Lumbar Exercises: Seated   Other Seated Lumbar Exercises anterior pelvic tilt in sitting 1 x 10 with abdominal draw in manuever holding 3 seconds      Modalities   Modalities --      Traction   Type of Traction --      Manual Therapy   Manual Therapy Soft tissue mobilization;Joint mobilization    Manual therapy comments skilled palpation    Joint Mobilization Grade V lumbar manipulation sidelying gapping technique bil    Soft tissue mobilization DTM along bil lumbar paraspinals            Trigger Point Dry  Needling - 07/09/20 0001    Consent Given? Yes    Education Handout Provided Yes    Muscles Treated Back/Hip Lumbar multifidi    Lumbar multifidi Response Twitch response elicited;Palpable increased muscle length   bil lumbar               PT Education - 07/09/20 1530    Education Details muscle anatomy and referral patterns. What TPDN is benefits, what to expect and after care. Reviewed HEP and updated today    Person(s) Educated Patient    Methods Explanation;Verbal cues;Handout    Comprehension Verbalized understanding;Verbal cues required               PT Long Term Goals - 07/05/20 1447      PT LONG TERM GOAL #1   Title Pt will be I and compliant with HEP.    Time 6    Period Weeks    Status New    Target Date 08/16/20      PT LONG TERM GOAL #2   Title Pt will improve lumbar ROM to WNL.    Time 6    Period Weeks    Status New      PT LONG TERM GOAL #3   Title Pt will improve core strength to 5/5 but demonstrating holding a plank for 45 seconds without signs of instability.    Time 6    Period Weeks    Status New        PT LONG TERM GOAL #4   Title Pt will reduce overall pain by 50% with usual activity and ADL's.    Time 6    Period Weeks    Status New                 Plan - 07/09/20 1604    Clinical Impression Statement pt arrived noting limited improvement since the last session and that he has been consistent with his HEP. educated and consent was provided for TPDN focusing on bil lumbar multifidi followed with IASTM techniques and lumbar grade V manipulation with cavitation noted. continued stretching the low back and rewviewed efficient posture in sitting. Held off on traction today to assess response to DN.    PT Treatment/Interventions ADLs/Self Care Home Management;Electrical Stimulation;Cryotherapy;Moist Heat;Iontophoresis 4mg /ml Dexamethasone;Traction;Ultrasound;Therapeutic activities;Therapeutic exercise;Neuromuscular re-education;Manual techniques;Joint Manipulations;Spinal Manipulations;Taping    PT Next Visit Plan review and update HEP PRN,  response to DN, continue lumbar stretching and strengthening, trial LAD or mechanical traction PRN.           Patient will benefit from skilled therapeutic intervention in order to improve the following deficits and impairments:  Decreased activity tolerance, Decreased mobility, Decreased strength, Difficulty walking, Postural dysfunction, Pain  Visit Diagnosis: Chronic bilateral low back pain without sciatica  Muscle weakness (generalized)     Problem List Patient Active Problem List   Diagnosis Date Noted  . Memory changes 05/28/2019  . Skin sensation disturbance 05/28/2019  . Hyperglycemia 05/27/2019  . History of gout 08/23/2017  . Erectile dysfunction 08/23/2017  . History of pancreatitis 02/16/2017  . Skin lesions 08/06/2015  . Renal mass, right 07/31/2014  . Alcohol abuse 07/22/2014  . Fatty liver 07/22/2014  . Equinus deformity of foot, acquired 03/02/2014  . 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 ALLERGIC RHINITIS DUE TO POLLEN  02/14/2010  . Essential hypertension 03/09/2008  . Hypertriglyceridemia 08/16/2007  . GERD 08/16/2007  . IBS (irritable bowel syndrome) 08/15/2007    Ahlana Slaydon PT, DPT, LAT, ATC  07/09/20  4:10 PM      [facility] [address] [place], [place], [address] Phone: [phone]   Fax:  [phone]  Name: Christopher [name] MRN: [id number] Date of Birth: July 08, 1976

## 2020-07-13 ENCOUNTER — Encounter: Payer: Self-pay | Admitting: Rehabilitative and Restorative Service Providers"

## 2020-07-13 ENCOUNTER — Ambulatory Visit: Payer: 59 | Admitting: Rehabilitative and Restorative Service Providers"

## 2020-07-13 ENCOUNTER — Other Ambulatory Visit: Payer: Self-pay

## 2020-07-13 DIAGNOSIS — M545 Low back pain, unspecified: Secondary | ICD-10-CM

## 2020-07-13 DIAGNOSIS — R293 Abnormal posture: Secondary | ICD-10-CM | POA: Diagnosis not present

## 2020-07-13 DIAGNOSIS — M6281 Muscle weakness (generalized): Secondary | ICD-10-CM

## 2020-07-13 DIAGNOSIS — G8929 Other chronic pain: Secondary | ICD-10-CM | POA: Diagnosis not present

## 2020-07-13 NOTE — Patient Instructions (Signed)
Access Code: [email or web address] URL: [email or web address] Date: 07/13/2020 Prepared by: Vista Mink  Exercises Standing Scapular Retraction - 5 x daily - 7 x weekly - 1 sets - 5 reps - 5 seconds hold Standing Lumbar Extension at Java - 5 x daily - 7 x weekly - 1 sets - 5 reps - 3 seconds hold Supine Single Knee to Chest Stretch - 2-3 x daily - 7 x weekly - 1 sets - 5 reps - 20 seconds hold Hooklying Active Hamstring Stretch - 2-3 x daily - 7 x weekly - 1 sets - 5 reps - 20 seconds hold Prone Hip Extension - 1-2 x daily - 7 x weekly - 2 sets - 10 reps - 5 second hold Prone Alternating Arm and Leg Lifts - 1-2 x daily - 7 x weekly - 2 sets - 10 reps - 3 seconds hold

## 2020-07-13 NOTE — Therapy (Signed)
[facility] Therapy [address] [place], [place], [address] Phone: [phone]   Fax:  [phone]  Physical Therapy Treatment  Patient Details  Name: Christopher [name] MRN: [id number] Date of Birth: December 24, 1975 Referring Provider (PT): Gregor Hams, MD   Encounter Date: 07/13/2020   PT End of Session - 07/13/20 1331    Visit Number 3    Number of Visits 12    Date for PT Re-Evaluation 08/16/20    Authorization Type UHC    PT Start Time 5361    PT Stop Time 0844    PT Time Calculation (min) 47 min    Activity Tolerance Patient tolerated treatment well;No increased pain;Patient limited by fatigue    Behavior During Therapy [facility] for tasks assessed/performed           Past Medical History:  Diagnosis Date  . GERD (gastroesophageal reflux disease)   . H/O hiatal hernia   . Hepatic steatosis   . Hyperlipidemia   . Hypertension   . IBS (irritable bowel syndrome)   . Kidney cyst, acquired   . Migraine    "probably monthly" (07/22/2014)  . 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 Pain in lower limb 03/02/2014  . Pancreatitis     Past Surgical History:  Procedure Laterality Date  . CARPAL TUNNEL RELEASE Right 2008  . COLONOSCOPY     IBS  . CYST EXCISION Right 2015   "2nd toe"  . KNEE ARTHROSCOPY Right 1992  . LASIK    . REFRACTIVE SURGERY Bilateral 2013  . SHOULDER SURGERY  10/2016  . UPPER GASTROINTESTINAL ENDOSCOPY    . UPPER GI ENDOSCOPY     chest pain  . WRIST FRACTURE SURGERY Right 2008   "got plate and screws"    There were no vitals filed for this visit.   Subjective Assessment - 07/13/20 1328    Subjective Low back stiffness is [place]'s #1 complaint.    Diagnostic tests Imaging: XR "Minimal stable spondylosis at L3/L4.  No acute bony abnormality    Currently in Pain? Yes    Pain Score 4     Pain Location Back    Pain Orientation Lower    Pain Descriptors / Indicators Aching;Tightness    Pain Type Chronic pain    Pain Radiating Towards  No    Pain Onset More than a month ago    Pain Frequency Constant    Aggravating Factors  Prolonged postures    Pain Relieving Factors Stretching    Effect of Pain on Daily Activities Limits participation in exercise                             [facility] Adult PT Treatment/Exercise - 07/13/20 0001      Exercises   Exercises Lumbar      Lumbar Exercises: Stretches   Active Hamstring Stretch Left;Right;4 reps;20 seconds   Other leg straight   Single Knee to Chest Stretch 4 reps;20 seconds;Left;Right   Other leg straight     Lumbar Exercises: Standing   Other Standing Lumbar Exercises Trunk extension AROM 10X 3 seconds    Other Standing Lumbar Exercises Shoulder blade pinches 10X 5 seconds      Lumbar Exercises: Prone   Opposite Arm/Leg Raise Left arm/Right leg;Right arm/Left leg;10 reps;3 seconds   2 sets   Other Prone Lumbar Exercises 2 sets of 10 3 seconds  PT Education - 07/13/20 1329    Education Details Reviewed HEP.  Spent a lot of time on spine anatomy and body mechanics.  Added standing activities to do all day long and at work.  Added spine strengthening to HEP.    Person(s) Educated Patient    Methods Explanation;Verbal cues;Handout;Demonstration;Tactile cues    Comprehension Verbalized understanding;Returned demonstration;Verbal cues required;Need further instruction;Tactile cues required               PT Long Term Goals - 07/13/20 1330      PT LONG TERM GOAL #1   Title Pt will be I and compliant with HEP.    Time 6    Period Weeks    Status On-going      PT LONG TERM GOAL #2   Title Pt will improve lumbar ROM to WNL.    Time 6    Period Weeks    Status On-going      PT LONG TERM GOAL #3   Title Pt will improve core strength to 5/5 but demonstrating holding a plank for 45 seconds without signs of instability.    Baseline 39 seconds prone B arm and leg extensions (60 seconds goal)    Time 6    Period Weeks     Status On-going      PT LONG TERM GOAL #4   Title Pt will reduce overall pain by 50% with usual activity and ADL's.    Time 6    Period Weeks    Status On-going                 Plan - 07/13/20 1332    Clinical Impression Statement Christopher [name] sits most of the day at work.  We discussed implementing frequent changes of position and added 2 activities (standing trunk extension and shoulder blade pinches) to be completed throughout the day to help reduce stiffness and pain associated with prolonged postures.  Core strength activities were also added.  We also discussed adding walking at least 3X/week for 20 minutes to improve core strength and reduce stiffness.  Continue strength progressions, body mechanics work and education to improve Christopher [name]'s function.    Examination-Activity Limitations Lift;Stand;Stairs;Squat;Sleep;Carry    Examination-Participation Restrictions Cleaning;Community Activity;Yard Work    Rehab Potential Good    PT Frequency 2x / week    PT Duration 6 weeks    PT Treatment/Interventions ADLs/Self Care Home Management;Electrical Stimulation;Cryotherapy;Moist Heat;Iontophoresis 4mg /ml Dexamethasone;Traction;Ultrasound;Therapeutic activities;Therapeutic exercise;Neuromuscular re-education;Manual techniques;Joint Manipulations;Spinal Manipulations;Taping    PT Next Visit Plan Strength and body mechanics work    PT Home Exercise Plan Access Code: [email or web address]    Consulted and Agree with Plan of Care Patient           Patient will benefit from skilled therapeutic intervention in order to improve the following deficits and impairments:  Decreased activity tolerance, Decreased mobility, Decreased strength, Difficulty walking, Postural dysfunction, Pain  Visit Diagnosis: Chronic bilateral low back pain without sciatica  Muscle weakness (generalized)  Abnormal posture     Problem List Patient Active Problem List   Diagnosis Date Noted  . Memory changes 05/28/2019  .  Skin sensation disturbance 05/28/2019  . Hyperglycemia 05/27/2019  . History of gout 08/23/2017  . Erectile dysfunction 08/23/2017  . History of pancreatitis 02/16/2017  . Skin lesions 08/06/2015  . Renal mass, right 07/31/2014  . Alcohol abuse 07/22/2014  . Fatty liver 07/22/2014  . Equinus deformity of foot, acquired 03/02/2014  . 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 ALLERGIC RHINITIS  DUE TO POLLEN 02/14/2010  . Essential hypertension 03/09/2008  . Hypertriglyceridemia 08/16/2007  . GERD 08/16/2007  . IBS (irritable bowel syndrome) 08/15/2007    [name] Ly PT, MPT 07/13/2020, 1:36 PM  [facility] [address] [place], [place], [address] Phone: [phone]   Fax:  [phone]  Name: Dnaiel Voller MRN: [id number] Date of Birth: 11-Nov-1976

## 2020-07-15 ENCOUNTER — Other Ambulatory Visit: Payer: Self-pay

## 2020-07-15 ENCOUNTER — Ambulatory Visit (INDEPENDENT_AMBULATORY_CARE_PROVIDER_SITE_OTHER): Payer: 59 | Admitting: Physical Therapy

## 2020-07-15 DIAGNOSIS — M545 Low back pain, unspecified: Secondary | ICD-10-CM

## 2020-07-15 DIAGNOSIS — M6281 Muscle weakness (generalized): Secondary | ICD-10-CM | POA: Diagnosis not present

## 2020-07-15 DIAGNOSIS — R293 Abnormal posture: Secondary | ICD-10-CM | POA: Diagnosis not present

## 2020-07-15 DIAGNOSIS — G8929 Other chronic pain: Secondary | ICD-10-CM

## 2020-07-15 NOTE — Therapy (Signed)
[facility] Therapy [address] [place], [place], [address] Phone: [phone]   Fax:  [phone]  Physical Therapy Treatment  Patient Details  Name: Christopher [name] MRN: [id number] Date of Birth: 1976/06/13 Referring Provider (PT): Gregor Hams, MD   Encounter Date: 07/15/2020   PT End of Session - 07/15/20 0842    Visit Number 4    Number of Visits 12    Date for PT Re-Evaluation 08/16/20    Authorization Type UHC    PT Start Time 0804    PT Stop Time 9702    PT Time Calculation (min) 53 min    Activity Tolerance Patient tolerated treatment well;No increased pain;Patient limited by fatigue    Behavior During Therapy [facility] for tasks assessed/performed           Past Medical History:  Diagnosis Date  . GERD (gastroesophageal reflux disease)   . H/O hiatal hernia   . Hepatic steatosis   . Hyperlipidemia   . Hypertension   . IBS (irritable bowel syndrome)   . Kidney cyst, acquired   . Migraine    "probably monthly" (07/22/2014)  . 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 Pain in lower limb 03/02/2014  . Pancreatitis     Past Surgical History:  Procedure Laterality Date  . CARPAL TUNNEL RELEASE Right 2008  . COLONOSCOPY     IBS  . CYST EXCISION Right 2015   "2nd toe"  . KNEE ARTHROSCOPY Right 1992  . LASIK    . REFRACTIVE SURGERY Bilateral 2013  . SHOULDER SURGERY  10/2016  . UPPER GASTROINTESTINAL ENDOSCOPY    . UPPER GI ENDOSCOPY     chest pain  . WRIST FRACTURE SURGERY Right 2008   "got plate and screws"    There were no vitals filed for this visit.   Subjective Assessment - 07/15/20 0808    Subjective Back is about the same, maybe a little better, he knows that he has weak core and it will take some time with the exercises. LBP 3/10 today and it is intermittent.    Pertinent History no significant PMH related to his LBP    Limitations Lifting;Standing;House hold activities    How long can you stand comfortably? 10    How long can you  walk comfortably? not limited walking    Diagnostic tests Imaging: XR "Minimal stable spondylosis at L3/L4.  No acute bony abnormality    Patient Stated Goals reduce pain, improve core strength    Pain Onset More than a month ago                             [facility] Adult PT Treatment/Exercise - 07/15/20 0001      Lumbar Exercises: Stretches   Active Hamstring Stretch Right;Left;2 reps;30 seconds    Single Knee to Chest Stretch Right;Left;2 reps;30 seconds      Lumbar Exercises: Aerobic   Recumbent Bike 5 min L3      Lumbar Exercises: Standing   Row 20 reps    Theraband Level (Row) Level 4 (Blue)    Shoulder Extension 20 reps    Theraband Level (Shoulder Extension) Level 4 (Blue)      Lumbar Exercises: Prone   Opposite Arm/Leg Raise Left arm/Right leg;Right arm/Left leg;10 reps;3 seconds      Lumbar Exercises: Quadruped   Plank 30 sec X 3 reps      Traction   Type of Traction Lumbar  Min (lbs) 75    Max (lbs) 85    Hold Time 60    Rest Time 20    Time 15                  PT Education - 07/15/20 0842    Education Details traction rationale    Person(s) Educated Patient    Methods Explanation;Demonstration    Comprehension Verbalized understanding               PT Long Term Goals - 07/13/20 1330      PT LONG TERM GOAL #1   Title Pt will be I and compliant with HEP.    Time 6    Period Weeks    Status On-going      PT LONG TERM GOAL #2   Title Pt will improve lumbar ROM to WNL.    Time 6    Period Weeks    Status On-going      PT LONG TERM GOAL #3   Title Pt will improve core strength to 5/5 but demonstrating holding a plank for 45 seconds without signs of instability.    Baseline 39 seconds prone B arm and leg extensions (60 seconds goal)    Time 6    Period Weeks    Status On-going      PT LONG TERM GOAL #4   Title Pt will reduce overall pain by 50% with usual activity and ADL's.    Time 6    Period Weeks    Status  On-going                 Plan - 07/15/20 3810    Clinical Impression Statement Progressed overall core strenghtening today with good tolerance. Continued with stretching for lumbar and H.S. Trialed mechanical traction for spinal decompression to see if this helps relieve pain and stiffness and will assess his reponse to this next session.    Examination-Activity Limitations Lift;Stand;Stairs;Squat;Sleep;Carry    Examination-Participation Restrictions Cleaning;Community Activity;Yard Work    Rehab Potential Good    PT Frequency 2x / week    PT Duration 6 weeks    PT Treatment/Interventions ADLs/Self Care Home Management;Electrical Stimulation;Cryotherapy;Moist Heat;Iontophoresis 4mg /ml Dexamethasone;Traction;Ultrasound;Therapeutic activities;Therapeutic exercise;Neuromuscular re-education;Manual techniques;Joint Manipulations;Spinal Manipulations;Taping    PT Next Visit Plan Strength and body mechanics work, how was traction    PT Home Exercise Plan Access Code: [email or web address]    Consulted and Agree with Plan of Care Patient           Patient will benefit from skilled therapeutic intervention in order to improve the following deficits and impairments:  Decreased activity tolerance, Decreased mobility, Decreased strength, Difficulty walking, Postural dysfunction, Pain  Visit Diagnosis: Chronic bilateral low back pain without sciatica  Muscle weakness (generalized)  Abnormal posture     Problem List Patient Active Problem List   Diagnosis Date Noted  . Memory changes 05/28/2019  . Skin sensation disturbance 05/28/2019  . Hyperglycemia 05/27/2019  . History of gout 08/23/2017  . Erectile dysfunction 08/23/2017  . History of pancreatitis 02/16/2017  . Skin lesions 08/06/2015  . Renal mass, right 07/31/2014  . Alcohol abuse 07/22/2014  . Fatty liver 07/22/2014  . Equinus deformity of foot, acquired 03/02/2014  . 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 ALLERGIC RHINITIS DUE TO POLLEN  02/14/2010  . Essential hypertension 03/09/2008  . Hypertriglyceridemia 08/16/2007  . GERD 08/16/2007  . IBS (irritable bowel syndrome) 08/15/2007    Silvestre Mesi 07/15/2020, 9:00 AM  [place]  [facility] Physical Therapy [address] [place], [place], [address] Phone: [phone]   Fax:  [phone]  Name: Christopher [name] MRN: [id number] Date of Birth: 02-Oct-1976

## 2020-07-19 ENCOUNTER — Other Ambulatory Visit: Payer: Self-pay

## 2020-07-19 ENCOUNTER — Ambulatory Visit (INDEPENDENT_AMBULATORY_CARE_PROVIDER_SITE_OTHER): Payer: 59 | Admitting: Physical Therapy

## 2020-07-19 DIAGNOSIS — M6281 Muscle weakness (generalized): Secondary | ICD-10-CM

## 2020-07-19 DIAGNOSIS — M545 Low back pain: Secondary | ICD-10-CM

## 2020-07-19 DIAGNOSIS — G8929 Other chronic pain: Secondary | ICD-10-CM | POA: Diagnosis not present

## 2020-07-19 DIAGNOSIS — R293 Abnormal posture: Secondary | ICD-10-CM

## 2020-07-19 NOTE — Therapy (Signed)
[facility] Physical Therapy [address] [place], [place], [address] Phone: [phone]   Fax:  [phone]  Physical Therapy Treatment  Patient Details  Name: Christopher [name] MRN: [id number] Date of Birth: [birth year] Referring Provider (PT): Gregor Hams, MD   Encounter Date: 07/19/2020   PT End of Session - 07/19/20 0839    Visit Number 5    Number of Visits 12    Date for PT Re-Evaluation 08/16/20    Authorization Type UHC    PT Start Time 0802    PT Stop Time 5188    PT Time Calculation (min) 53 min    Activity Tolerance Patient tolerated treatment well;No increased pain;Patient limited by fatigue    Behavior During Therapy [facility] for tasks assessed/performed           Past Medical History:  Diagnosis Date  . GERD (gastroesophageal reflux disease)   . H/O hiatal hernia   . Hepatic steatosis   . Hyperlipidemia   . Hypertension   . IBS (irritable bowel syndrome)   . Kidney cyst, acquired   . Migraine    "probably monthly" (07/22/2014)  . 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 Pain in lower limb 03/02/2014  . Pancreatitis     Past Surgical History:  Procedure Laterality Date  . CARPAL TUNNEL RELEASE Right 2008  . COLONOSCOPY     IBS  . CYST EXCISION Right 2015   "2nd toe"  . KNEE ARTHROSCOPY Right 1992  . LASIK    . REFRACTIVE SURGERY Bilateral 2013  . SHOULDER SURGERY  10/2016  . UPPER GASTROINTESTINAL ENDOSCOPY    . UPPER GI ENDOSCOPY     chest pain  . WRIST FRACTURE SURGERY Right 2008   "got plate and screws"    There were no vitals filed for this visit.   Subjective Assessment - 07/19/20 0839    Subjective He reports lumbar stiffness but not much pain this morning overall pain appears to be improving some    Pertinent History no significant PMH related to his LBP    Limitations Lifting;Standing;House hold activities    How long can you stand comfortably? 10    How long can you walk comfortably? not limited walking    Diagnostic tests  Imaging: XR "Minimal stable spondylosis at L3/L4.  No acute bony abnormality    Patient Stated Goals reduce pain, improve core strength    Pain Onset More than a month ago              [facility] Adult PT Treatment/Exercise - 07/19/20 0001      Lumbar Exercises: Stretches   Active Hamstring Stretch Right;Left;2 reps;30 seconds    Single Knee to Chest Stretch Right;Left;2 reps;30 seconds    Double Knee to Chest Stretch 1 rep;30 seconds    Lower Trunk Rotation 5 reps;10 seconds      Lumbar Exercises: Aerobic   Recumbent Bike 5 min L3      Lumbar Exercises: Machines for Strengthening   Other Lumbar Machine Exercise Row machine 65 lbs 2X20, lat pull machine 55 lbs 2X20      Lumbar Exercises: Standing   Other Standing Lumbar Exercises farmers carry 25# one lap in clinic ea side. Then KB deadlift 20# 2X10, then diagonal lifts 10# X 10 ea side      Lumbar Exercises: Prone   Opposite Arm/Leg Raise Left arm/Right leg;Right arm/Left leg;10 reps;3 seconds      Traction   Type of Traction Lumbar    Min (  lbs) 85    Max (lbs) 100    Hold Time 60    Rest Time 20    Time 15                       PT Long Term Goals - 07/13/20 1330      PT LONG TERM GOAL #1   Title Pt will be I and compliant with HEP.    Time 6    Period Weeks    Status On-going      PT LONG TERM GOAL #2   Title Pt will improve lumbar ROM to WNL.    Time 6    Period Weeks    Status On-going      PT LONG TERM GOAL #3   Title Pt will improve core strength to 5/5 but demonstrating holding a plank for 45 seconds without signs of instability.    Baseline 39 seconds prone B arm and leg extensions (60 seconds goal)    Time 6    Period Weeks    Status On-going      PT LONG TERM GOAL #4   Title Pt will reduce overall pain by 50% with usual activity and ADL's.    Time 6    Period Weeks    Status On-going                 Plan - 07/19/20 0841    Clinical Impression Statement Progressed overall  therex and ther activities today adding in deadlift, diagonal lifts, farmers carry and mid back weight machines with good tolerance. Continue with stretching and core strength program and traction but increaesd amount of pull today with traction.    Examination-Activity Limitations Lift;Stand;Stairs;Squat;Sleep;Carry    Examination-Participation Restrictions Cleaning;Community Activity;Yard Work    Rehab Potential Good    PT Frequency 2x / week    PT Duration 6 weeks    PT Treatment/Interventions ADLs/Self Care Home Management;Electrical Stimulation;Cryotherapy;Moist Heat;Iontophoresis 4mg /ml Dexamethasone;Traction;Ultrasound;Therapeutic activities;Therapeutic exercise;Neuromuscular re-education;Manual techniques;Joint Manipulations;Spinal Manipulations;Taping    PT Next Visit Plan Strength and body mechanics work, how was traction    PT Home Exercise Plan Access Code: [email or web address]    Consulted and Agree with Plan of Care Patient           Patient will benefit from skilled therapeutic intervention in order to improve the following deficits and impairments:  Decreased activity tolerance, Decreased mobility, Decreased strength, Difficulty walking, Postural dysfunction, Pain  Visit Diagnosis: Chronic bilateral low back pain without sciatica  Muscle weakness (generalized)  Abnormal posture     Problem List Patient Active Problem List   Diagnosis Date Noted  . Memory changes 05/28/2019  . Skin sensation disturbance 05/28/2019  . Hyperglycemia 05/27/2019  . History of gout 08/23/2017  . Erectile dysfunction 08/23/2017  . History of pancreatitis 02/16/2017  . Skin lesions 08/06/2015  . Renal mass, right 07/31/2014  . Alcohol abuse 07/22/2014  . Fatty liver 07/22/2014  . Equinus deformity of foot, acquired 03/02/2014  . 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 ALLERGIC RHINITIS DUE TO POLLEN 02/14/2010  . Essential hypertension 03/09/2008  . Hypertriglyceridemia 08/16/2007  . GERD 08/16/2007  .  IBS (irritable bowel syndrome) 08/15/2007    Debbe Odea, PT,DPT 07/19/2020, 8:45 AM  [facility] Physical Therapy [address] [place], [place], [address] Phone: [phone]   Fax:  [phone]  Name: Christopher [name] MRN: [id number] Date of Birth: 1976-07-27

## 2020-07-23 ENCOUNTER — Encounter: Payer: Self-pay | Admitting: Physical Therapy

## 2020-07-23 ENCOUNTER — Ambulatory Visit: Payer: 59 | Admitting: Physical Therapy

## 2020-07-23 ENCOUNTER — Other Ambulatory Visit: Payer: Self-pay

## 2020-07-23 DIAGNOSIS — G8929 Other chronic pain: Secondary | ICD-10-CM | POA: Diagnosis not present

## 2020-07-23 DIAGNOSIS — M6281 Muscle weakness (generalized): Secondary | ICD-10-CM | POA: Diagnosis not present

## 2020-07-23 DIAGNOSIS — R293 Abnormal posture: Secondary | ICD-10-CM | POA: Diagnosis not present

## 2020-07-23 DIAGNOSIS — M545 Low back pain: Secondary | ICD-10-CM | POA: Diagnosis not present

## 2020-07-23 NOTE — Therapy (Addendum)
[facility] Physical Therapy [address] [place], [place], [address] Phone: [phone]   Fax:  [phone]  Physical Therapy Treatment/DC Summary  Patient Details  Name: Christopher [name] MRN: [id number] Date of Birth: [birth year] Referring Provider (PT): Gregor Hams, MD  PHYSICAL THERAPY DISCHARGE SUMMARY  Visits from Start of Care: 6  Current functional level related to goals / functional outcomes: See note   Remaining deficits: See note   Education / Equipment: HEP Plan:                                                    Patient goals were partially met. Patient is being discharged due to the physician's request.  ?????      Encounter Date: 07/23/2020   PT End of Session - 07/23/20 1430    Visit Number 6    Number of Visits 12    Date for PT Re-Evaluation 08/16/20    Authorization Type UHC    PT Start Time 1430    PT Stop Time 1510    PT Time Calculation (min) 40 min    Activity Tolerance Patient tolerated treatment well;No increased pain;Patient limited by fatigue           Past Medical History:  Diagnosis Date  . GERD (gastroesophageal reflux disease)   . H/O hiatal hernia   . Hepatic steatosis   . Hyperlipidemia   . Hypertension   . IBS (irritable bowel syndrome)   . Kidney cyst, acquired   . Migraine    "probably monthly" (07/22/2014)  . 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 Pain in lower limb 03/02/2014  . Pancreatitis     Past Surgical History:  Procedure Laterality Date  . CARPAL TUNNEL RELEASE Right 2008  . COLONOSCOPY     IBS  . CYST EXCISION Right 2015   "2nd toe"  . KNEE ARTHROSCOPY Right 1992  . LASIK    . REFRACTIVE SURGERY Bilateral 2013  . SHOULDER SURGERY  10/2016  . UPPER GASTROINTESTINAL ENDOSCOPY    . UPPER GI ENDOSCOPY     chest pain  . WRIST FRACTURE SURGERY Right 2008   "got plate and screws"    There were no vitals filed for this visit.   Subjective Assessment - 07/23/20 1431    Subjective "still get  some stiffness just depends on day and time. I have been very busy so I've not been very consistent."    Currently in Pain? Yes    Pain Score 0-No pain    Pain Orientation Lower    Pain Type Chronic pain    Pain Onset More than a month ago    Pain Frequency Intermittent    Aggravating Factors  prlonged sitting              [facility] PT Assessment - 07/23/20 0001      Assessment   Medical Diagnosis M54.5,G89.29 (ICD-10-CM) - Chronic bilateral low back pain without sciatica    Referring Provider (PT) Gregor Hams, MD                         [facility] Adult PT Treatment/Exercise - 07/23/20 0001      Lumbar Exercises: Stretches   Other Lumbar Stretch Exercise low back stretch walking hands out onto phyisoball 2 x 30 seconds  Lumbar Exercises: Standing   Other Standing Lumbar Exercises seated on physioball anterior pelvic tilt with marching 2 x 15, 2 x 15 with contralateral UE/LE movement. horzontal lift/ chop 1 x 20 bil    Other Standing Lumbar Exercises palloff press 2 x 20 bil with blue band      Lumbar Exercises: Seated   Sit to Stand 20 reps   with green band around knees x 2 sets with eccentric lowerin     Lumbar Exercises: Supine   Dead Bug 5 reps   5 seconds   Dead Bug Limitations progressed to contralateral UE/LE movement with ball on stomach to promote form. 2 x 10 with obilque reaching maintaining table top position.     Bridge 15 reps   with heels on physioball x 2 sets   Other Supine Lumbar Exercises pushing bil UE down onto physioball against the knees 2 x 10 holding 3 seconds exhaling while pushing and inhaling with release      Manual Therapy   Manual therapy comments MTPR along bil lumbar paraspinals                       PT Long Term Goals - 07/13/20 1330      PT LONG TERM GOAL #1   Title Pt will be I and compliant with HEP.    Time 6    Period Weeks    Status On-going      PT LONG TERM GOAL #2   Title Pt will improve lumbar  ROM to WNL.    Time 6    Period Weeks    Status On-going      PT LONG TERM GOAL #3   Title Pt will improve core strength to 5/5 but demonstrating holding a plank for 45 seconds without signs of instability.    Baseline 39 seconds prone B arm and leg extensions (60 seconds goal)    Time 6    Period Weeks    Status On-going      PT LONG TERM GOAL #4   Title Pt will reduce overall pain by 50% with usual activity and ADL's.    Time 6    Period Weeks    Status On-going                 Plan - 07/23/20 1513    Clinical Impression Statement pt reports flucutating stiffness in his back which he states is position dependent. opted to hold off on traction due to pt noting limited improvement. Continued working core strengthening in various positions and hip strengthening with increased reps to promote endurance. pt reported no pain during or following session.    PT Treatment/Interventions ADLs/Self Care Home Management;Electrical Stimulation;Cryotherapy;Moist Heat;Iontophoresis 69m/ml Dexamethasone;Traction;Ultrasound;Therapeutic activities;Therapeutic exercise;Neuromuscular re-education;Manual techniques;Joint Manipulations;Spinal Manipulations;Taping    PT Next Visit Plan Strength and body mechanics work, continue traction if benefical    PT Home Exercise Plan Access Code: [email or web address]   Consulted and Agree with Plan of Care Patient           Patient will benefit from skilled therapeutic intervention in order to improve the following deficits and impairments:  Decreased activity tolerance, Decreased mobility, Decreased strength, Difficulty walking, Postural dysfunction, Pain  Visit Diagnosis: Chronic bilateral low back pain without sciatica  Muscle weakness (generalized)  Abnormal posture     Problem List Patient Active Problem List   Diagnosis Date Noted  . Memory changes 05/28/2019  . Skin sensation disturbance 05/28/2019  .  Hyperglycemia 05/27/2019  . History of gout  08/23/2017  . Erectile dysfunction 08/23/2017  . History of pancreatitis 02/16/2017  . Skin lesions 08/06/2015  . Renal mass, right 07/31/2014  . Alcohol abuse 07/22/2014  . Fatty liver 07/22/2014  . Equinus deformity of foot, acquired 03/02/2014  . 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 ALLERGIC RHINITIS DUE TO POLLEN 02/14/2010  . Essential hypertension 03/09/2008  . Hypertriglyceridemia 08/16/2007  . GERD 08/16/2007  . IBS (irritable bowel syndrome) 08/15/2007   Starr Lake PT, DPT, LAT, ATC  07/23/20  3:17 PM   Vista Mink MPT   [facility] Physical Therapy [address] [place], [place], [address] Phone: [phone]   Fax:  [phone]  Name: Christopher [name] MRN: [id number] Date of Birth: 01/30/1976

## 2020-07-26 ENCOUNTER — Encounter: Payer: Self-pay | Admitting: Physical Therapy

## 2020-07-30 ENCOUNTER — Encounter: Payer: Self-pay | Admitting: Physical Therapy

## 2020-08-03 MED ORDER — METOPROLOL TARTRATE 100 MG PO TABS: ORAL_TABLET | ORAL | 0 refills | Status: DC

## 2020-08-05 ENCOUNTER — Ambulatory Visit: Payer: 59 | Admitting: Family Medicine

## 2020-08-06 ENCOUNTER — Telehealth (HOSPITAL_COMMUNITY): Payer: Self-pay | Admitting: Emergency Medicine

## 2020-08-06 NOTE — Telephone Encounter (Signed)
Reaching out to patient to offer assistance regarding upcoming cardiac imaging study; pt verbalizes understanding of appt date/time, parking situation and where to check in, pre-test NPO status and medications ordered, and verified current allergies; name and call back number provided for further questions should they arise Marquie Aderhold RN Navigator Cardiac Imaging [facility] [phone] office [phone] cell 

## 2020-08-09 ENCOUNTER — Ambulatory Visit (HOSPITAL_COMMUNITY)
Admission: RE | Admit: 2020-08-09 | Discharge: 2020-08-09 | Disposition: A | Discharge status: Home / Self Care | Payer: 59 | Source: Ambulatory Visit | Attending: Cardiovascular Disease | Admitting: Cardiovascular Disease

## 2020-08-09 ENCOUNTER — Other Ambulatory Visit: Payer: Self-pay

## 2020-08-09 DIAGNOSIS — R0789 Other chest pain: Secondary | ICD-10-CM

## 2020-08-09 MED ORDER — NITROGLYCERIN 0.4 MG SL SUBL
SUBLINGUAL_TABLET | SUBLINGUAL | Status: AC
  Filled 2020-08-09: qty 2

## 2020-08-09 MED ORDER — NITROGLYCERIN 0.4 MG SL SUBL: 0.8000 mg | SUBLINGUAL_TABLET | Freq: Once | SUBLINGUAL | Status: DC

## 2020-08-09 MED ORDER — IOHEXOL 350 MG/ML SOLN
80.0000 mL | Freq: Once | INTRAVENOUS | Status: AC | PRN
  Administered 2020-08-09: 80 mL via INTRAVENOUS

## 2020-08-11 ENCOUNTER — Ambulatory Visit: Payer: 59 | Admitting: Family Medicine

## 2020-08-11 ENCOUNTER — Encounter: Payer: Self-pay | Admitting: Family Medicine

## 2020-08-11 ENCOUNTER — Other Ambulatory Visit: Payer: Self-pay

## 2020-08-11 VITALS — BP 128/90 | HR 68 | Ht 72.0 in | Wt 231.0 lb

## 2020-08-11 DIAGNOSIS — M545 Low back pain: Secondary | ICD-10-CM

## 2020-08-11 DIAGNOSIS — G8929 Other chronic pain: Secondary | ICD-10-CM | POA: Diagnosis not present

## 2020-08-11 NOTE — Progress Notes (Signed)
   Fontaine No, am serving as a scribe for Dr. Lynne Leader. This visit occurred during the SARS-CoV-2 public health emergency.  Safety protocols were in place, including screening questions prior to the visit, additional usage of staff PPE, and extensive cleaning of exam room while observing appropriate contact time as indicated for disinfecting solutions.   Christopher [name] is a 44 y.o. male who presents to [place] [facility] today for f/u of chronic LBP.  He was last seen by Dr. Georgina Snell on 06/15/20 and was referred to PT of which he has completed 6 visits.  Since his last visit w/ Dr. Georgina Snell, pt reports that he is improving. Did 3 sessions of therapy but is trying to work on exercises at home. Continues to have nagging lower back pain.   Diagnostic testing: L-spine XR- 06/15/20   Pertinent review of systems: No fevers or chills  Relevant historical information: Hypertension   Exam:  BP 128/90   Pulse 68   Ht 6' (1.829 m)   Wt 231 lb (104.8 kg)   SpO2 99%   BMI 31.33 kg/m  General: Well Developed, well nourished, and in no acute distress.   MSK: Lumbar spine normal-appearing nontender normal motion.    Lab and Radiology Results EXAM: LUMBAR SPINE - 2-3 VIEW  COMPARISON:  04/18/2016  FINDINGS: Frontal and lateral views of the lumbar spine demonstrate 5 non-rib-bearing lumbar type vertebral bodies in normal alignment. There are no fractures. Stable spondylosis at the L3/L4 level. Stable ankle oasis of the sacroiliac joints.  IMPRESSION: 1. Minimal stable spondylosis at L3/L4.  No acute bony abnormality.   Electronically Signed   By: Randa Ngo M.D.   On: 06/15/2020 22:24  I, Lynne Leader, personally (independently) visualized and performed the interpretation of the images attached in this note. Agree with radiology over read regarding mild spondylosis    Assessment and Plan: 44 y.o. 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  Discussed potential next steps.  Recommend home exercise program working especially core strengthening.  Recommend also intermittent heating pad and TENS unit.  If needed next step would be likely in clinic SI joint injection trial and/or MRI lumbar spine for facet injection planning.  Patient notes that his symptoms are not bad enough to proceed with MRI or injections at this time but he would consider that in the future if needed.  Recheck back with me as needed.     Discussed warning signs or symptoms. Please see discharge instructions. Patient expresses understanding.   The above documentation has been reviewed and is accurate and complete Lynne Leader, M.D.   Total encounter time 20 minutes including face-to-face time with the patient and charting on the date of service.

## 2020-08-11 NOTE — Patient Instructions (Signed)
Thank you for coming in today. We can do more if needed.  Next steps are either lumbar spine MRI for lumbar spine face injection planning.  We can and will likely also do SI joint injections here in clinic.   Continue to work on your home exercises.  Continue heat and TENS unit as needed.   Don't stop using your back.

## 2020-09-07 ENCOUNTER — Other Ambulatory Visit: Payer: Self-pay | Admitting: Internal Medicine

## 2020-09-21 ENCOUNTER — Other Ambulatory Visit: Payer: Self-pay | Admitting: Internal Medicine

## 2020-09-29 ENCOUNTER — Encounter: Payer: Self-pay | Admitting: Internal Medicine

## 2020-10-03 ENCOUNTER — Other Ambulatory Visit: Payer: Self-pay | Admitting: Internal Medicine

## 2020-10-03 MED ORDER — LOSARTAN POTASSIUM-HCTZ 100-25 MG PO TABS
1.0000 | ORAL_TABLET | Freq: Every day | ORAL | 1 refills | Status: DC
Start: 2020-10-03 — End: 2020-10-08

## 2020-10-08 ENCOUNTER — Other Ambulatory Visit: Payer: Self-pay

## 2020-10-08 MED ORDER — LOSARTAN POTASSIUM-HCTZ 100-25 MG PO TABS: 1.0000 | ORAL_TABLET | Freq: Every day | ORAL | 1 refills | Status: DC

## 2020-10-11 ENCOUNTER — Ambulatory Visit: Payer: 59 | Admitting: Cardiovascular Disease

## 2020-10-25 ENCOUNTER — Other Ambulatory Visit: Payer: Self-pay | Admitting: Internal Medicine

## 2020-10-26 ENCOUNTER — Other Ambulatory Visit: Payer: Self-pay

## 2020-10-26 MED ORDER — CILIDINIUM-CHLORDIAZEPOXIDE 2.5-5 MG PO CAPS: 1.0000 | ORAL_CAPSULE | Freq: Every day | ORAL | 1 refills | Status: DC

## 2020-10-27 MED ORDER — CILIDINIUM-CHLORDIAZEPOXIDE 2.5-5 MG PO CAPS
1.0000 | ORAL_CAPSULE | Freq: Every day | ORAL | 0 refills | Status: DC
Start: 2020-10-27 — End: 2020-10-27

## 2020-10-27 MED ORDER — CILIDINIUM-CHLORDIAZEPOXIDE 2.5-5 MG PO CAPS
1.0000 | ORAL_CAPSULE | Freq: Every day | ORAL | 0 refills | Status: DC
Start: 2020-10-27 — End: 2021-02-20

## 2020-11-23 ENCOUNTER — Encounter: Payer: Self-pay | Admitting: Internal Medicine

## 2020-12-02 ENCOUNTER — Telehealth (INDEPENDENT_AMBULATORY_CARE_PROVIDER_SITE_OTHER): Payer: 59 | Admitting: Internal Medicine

## 2020-12-02 ENCOUNTER — Ambulatory Visit: Payer: 59 | Admitting: Internal Medicine

## 2020-12-02 ENCOUNTER — Encounter: Payer: Self-pay | Admitting: Internal Medicine

## 2020-12-02 ENCOUNTER — Other Ambulatory Visit: Payer: Self-pay

## 2020-12-02 DIAGNOSIS — U071 COVID-19: Secondary | ICD-10-CM | POA: Diagnosis not present

## 2020-12-02 MED ORDER — BENZONATATE 200 MG PO CAPS: 200.0000 mg | ORAL_CAPSULE | Freq: Three times a day (TID) | ORAL | 0 refills | Status: DC | PRN

## 2020-12-02 NOTE — Progress Notes (Signed)
Virtual Visit via Video Note  I connected with Christopher [name] on 12/02/20 at  9:00 AM EST by a video enabled telemedicine application and verified that I am speaking with the correct person using two identifiers.  The patient and the provider were at separate locations throughout the entire encounter. Patient location: home, Provider location: work   I discussed the limitations of evaluation and management by telemedicine and the availability of in person appointments. The patient expressed understanding and agreed to proceed. The patient and the provider were the only parties present for the visit unless noted in HPI below.  History of Present Illness: The patient is a 45 y.o. man with visit for covid-19 positive on 11/23/20 and still experiencing symptoms. Supposed to get 2nd covid-19 shot today and wonders if he can do this. Started with symptoms almost 2 weeks ago now. Has some cough which is non-productive persistent and low grade fevers. Using tylenol and advil for the fevers when needed. Denies muscle aches, loss of taste/smell. Denies SOB. Overall it is improving. Has tried otc dayquil and nyquil and cough drops which have not been effective  Observations/Objective: Appearance: normal, breathing appears normal, minimal coughing during visit, speaking in full sentences, casual grooming, abdomen does not appear distended, throat normal, mental status is A and O times 3  Assessment and Plan: See problem oriented charting  Follow Up Instructions: return to work as scheduled, rx tessalon perles for cough, okay to get second covid-19 shot today  I discussed the assessment and treatment plan with the patient. The patient was provided an opportunity to ask questions and all were answered. The patient agreed with the plan and demonstrated an understanding of the instructions.   The patient was advised to call back or seek an in-person evaluation if the symptoms worsen or if the condition fails to  improve as anticipated.  Myrlene Broker, MD

## 2020-12-02 NOTE — Assessment & Plan Note (Signed)
With persistent cough at this time not outside the window of 2-4 weeks. Rx tessalon perles. Cleared to get covid-19 shot today and return to work Monday. Increase activity as tolerated.

## 2020-12-04 ENCOUNTER — Other Ambulatory Visit: Payer: Self-pay

## 2020-12-04 ENCOUNTER — Emergency Department (HOSPITAL_BASED_OUTPATIENT_CLINIC_OR_DEPARTMENT_OTHER)
Admission: EM | Admit: 2020-12-04 | Discharge: 2020-12-04 | Disposition: A | Discharge status: Home / Self Care | Payer: 59 | Attending: Emergency Medicine | Admitting: Emergency Medicine

## 2020-12-04 ENCOUNTER — Emergency Department (HOSPITAL_BASED_OUTPATIENT_CLINIC_OR_DEPARTMENT_OTHER): Payer: 59

## 2020-12-04 ENCOUNTER — Encounter (HOSPITAL_BASED_OUTPATIENT_CLINIC_OR_DEPARTMENT_OTHER): Payer: Self-pay | Admitting: *Deleted

## 2020-12-04 DIAGNOSIS — I10 Essential (primary) hypertension: Secondary | ICD-10-CM | POA: Insufficient documentation

## 2020-12-04 DIAGNOSIS — Z8616 Personal history of COVID-19: Secondary | ICD-10-CM | POA: Diagnosis not present

## 2020-12-04 DIAGNOSIS — K769 Liver disease, unspecified: Secondary | ICD-10-CM | POA: Diagnosis not present

## 2020-12-04 DIAGNOSIS — D7389 Other diseases of spleen: Secondary | ICD-10-CM | POA: Insufficient documentation

## 2020-12-04 DIAGNOSIS — K659 Peritonitis, unspecified: Secondary | ICD-10-CM | POA: Diagnosis not present

## 2020-12-04 DIAGNOSIS — Z79899 Other long term (current) drug therapy: Secondary | ICD-10-CM | POA: Diagnosis not present

## 2020-12-04 DIAGNOSIS — K6389 Other specified diseases of intestine: Secondary | ICD-10-CM

## 2020-12-04 DIAGNOSIS — R1032 Left lower quadrant pain: Secondary | ICD-10-CM | POA: Diagnosis present

## 2020-12-04 LAB — CBC
HCT: 36.9 % — ABNORMAL LOW (ref 39.0–52.0)
Hemoglobin: 12.4 g/dL — ABNORMAL LOW (ref 13.0–17.0)
MCH: 29.8 pg (ref 26.0–34.0)
MCHC: 33.6 g/dL (ref 30.0–36.0)
MCV: 88.7 fL (ref 80.0–100.0)
Platelets: 354 10*3/uL (ref 150–400)
RBC: 4.16 MIL/uL — ABNORMAL LOW (ref 4.22–5.81)
RDW: 12.6 % (ref 11.5–15.5)
WBC: 5.6 10*3/uL (ref 4.0–10.5)
nRBC: 0 % (ref 0.0–0.2)

## 2020-12-04 LAB — COMPREHENSIVE METABOLIC PANEL
ALT: 49 U/L — ABNORMAL HIGH (ref 0–44)
AST: 42 U/L — ABNORMAL HIGH (ref 15–41)
Albumin: 4.1 g/dL (ref 3.5–5.0)
Alkaline Phosphatase: 48 U/L (ref 38–126)
Anion gap: 14 (ref 5–15)
BUN: 15 mg/dL (ref 6–20)
CO2: 25 mmol/L (ref 22–32)
Calcium: 9 mg/dL (ref 8.9–10.3)
Chloride: 101 mmol/L (ref 98–111)
Creatinine, Ser: 1.15 mg/dL (ref 0.61–1.24)
GFR, Estimated: >60 mL/min (ref >60)
Glucose, Bld: 135 mg/dL — ABNORMAL HIGH (ref 70–99)
Potassium: 3.5 mmol/L (ref 3.5–5.1)
Sodium: 140 mmol/L (ref 135–145)
Total Bilirubin: 0.6 mg/dL (ref 0.3–1.2)
Total Protein: 7.9 g/dL (ref 6.5–8.1)

## 2020-12-04 LAB — URINALYSIS, ROUTINE W REFLEX MICROSCOPIC
Bilirubin Urine: NEGATIVE
Glucose, UA: NEGATIVE mg/dL
Hgb urine dipstick: NEGATIVE
Ketones, ur: NEGATIVE mg/dL
Leukocytes,Ua: NEGATIVE
Nitrite: NEGATIVE
Protein, ur: NEGATIVE mg/dL
Specific Gravity, Urine: 1.015 (ref 1.005–1.030)
pH: 7.5 (ref 5.0–8.0)

## 2020-12-04 LAB — LIPASE, BLOOD: Lipase: 37 U/L (ref 11–51)

## 2020-12-04 MED ORDER — HYDROCODONE-ACETAMINOPHEN 5-325 MG PO TABS: 1.0000 | ORAL_TABLET | Freq: Four times a day (QID) | ORAL | 0 refills | Status: DC | PRN

## 2020-12-04 MED ORDER — IOHEXOL 300 MG/ML  SOLN
100.0000 mL | Freq: Once | INTRAMUSCULAR | Status: AC | PRN
  Administered 2020-12-04: 100 mL via INTRAVENOUS

## 2020-12-04 MED ORDER — LACTATED RINGERS IV BOLUS
1000.0000 mL | Freq: Once | INTRAVENOUS | Status: AC
  Administered 2020-12-04: 1000 mL via INTRAVENOUS

## 2020-12-04 NOTE — ED Provider Notes (Incomplete)
[facility] EMERGENCY DEPARTMENT Provider Note   CSN: [id number] Arrival date & time: 12/04/20  1317     History Chief Complaint  Patient presents with  . Abdominal Pain    Christopher [name] is a 45 y.o.   Abdominal pain since yesterday, LLQ, diarrhea  HPI     Past Medical History:  Diagnosis Date  . GERD (gastroesophageal reflux disease)   . H/O hiatal hernia   . Hepatic steatosis   . Hyperlipidemia   . Hypertension   . IBS (irritable bowel syndrome)   . Kidney cyst, acquired   . Migraine    "probably monthly" (07/22/2014)  . 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 Pain in lower limb 03/02/2014  . Pancreatitis     Patient Active Problem List   Diagnosis Date Noted  . COVID-19 12/02/2020  . Memory changes 05/28/2019  . Skin sensation disturbance 05/28/2019  . Hyperglycemia 05/27/2019  . History of gout 08/23/2017  . Erectile dysfunction 08/23/2017  . History of pancreatitis 02/16/2017  . Skin lesions 08/06/2015  . Renal mass, right 07/31/2014  . Alcohol abuse 07/22/2014  . Fatty liver 07/22/2014  . Equinus deformity of foot, acquired 03/02/2014  . 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 ALLERGIC RHINITIS DUE TO POLLEN 02/14/2010  . Essential hypertension 03/09/2008  . Hypertriglyceridemia 08/16/2007  . GERD 08/16/2007  . IBS (irritable bowel syndrome) 08/15/2007    Past Surgical History:  Procedure Laterality Date  . CARPAL TUNNEL RELEASE Right 2008  . COLONOSCOPY     IBS  . CYST EXCISION Right 2015   "2nd toe"  . KNEE ARTHROSCOPY Right 1992  . LASIK    . REFRACTIVE SURGERY Bilateral 2013  . SHOULDER SURGERY  10/2016  . UPPER GASTROINTESTINAL ENDOSCOPY    . UPPER GI ENDOSCOPY     chest pain  . WRIST FRACTURE SURGERY Right 2008   "got plate and screws"       Family History  Problem Relation Age of Onset  . Hypertension Mother   . Hyperlipidemia Mother   . Depression Mother   . Hypertension Father   . Alzheimer's disease Maternal Grandmother   . Alcohol  abuse Maternal Grandfather   . Heart disease Maternal Grandfather   . Heart failure Maternal Grandfather   . Alzheimer's disease Paternal Grandmother   . Alcohol abuse Paternal Grandfather   . Depression Sister   . Heart disease Maternal Uncle   . Colon cancer Neg Hx     Social History   Tobacco Use  . Smoking status: Never Smoker  . Smokeless tobacco: Never Used  Substance Use Topics  . Alcohol use: Yes    Comment: 5-6 drinks once a week  . Drug use: No    Home Medications Prior to Admission medications   Medication Sig Start Date End Date Taking? Authorizing Provider  amLODipine (NORVASC) 10 MG tablet TAKE 1 TABLET BY MOUTH  DAILY 04/23/20   Pincus Sanes, MD  benzonatate (TESSALON) 200 MG capsule Take 1 capsule (200 mg total) by mouth 3 (three) times daily as needed for cough. 12/02/20   Myrlene Broker, MD  clidinium-chlordiazePOXIDE (LIBRAX) 5-2.5 MG capsule Take 1 capsule by mouth daily. 10/27/20   Pincus Sanes, MD  cyclobenzaprine (FLEXERIL) 10 MG tablet Take 1 tablet (10 mg total) by mouth 3 (three) times daily as needed for muscle spasms. 06/22/20   Judi Saa, DO  fenofibrate (TRICOR) 145 MG tablet TAKE 1 TABLET BY MOUTH  DAILY 09/07/20  [name], MD  losartan-hydrochlorothiazide (HYZAAR) 100-25 MG tablet Take 1 tablet by mouth daily. 10/08/20   [name], MD  metoprolol tartrate (LOPRESSOR) 100 MG tablet Take 1 tablet by mouth once for procedure. 08/03/20   O'NealCassie Freer, MD  Multiple Vitamin (MULTIVITAMIN WITH MINERALS) TABS tablet Take 1 tablet by mouth daily.    [provider]  pantoprazole (PROTONIX) 40 MG tablet TAKE 1 TABLET BY MOUTH  DAILY 09/21/20   [name], MD    Allergies    Morphine and related  Review of Systems   Review of Systems  Physical Exam Updated Vital Signs BP 132/90 (BP Location: Left Arm)   Pulse 70   Temp 98.2 F (36.8 C) (Oral)   Resp 18   Ht 6' (1.829 m)   Wt 99.8 kg   SpO2 100%   BMI  29.84 kg/m   Physical Exam  ED Results / Procedures / Treatments   Labs (all labs ordered are listed, but only abnormal results are displayed) Labs Reviewed  COMPREHENSIVE METABOLIC PANEL - Abnormal; Notable for the following components:      Result Value   Glucose, Bld 135 (*)    AST 42 (*)    ALT 49 (*)    All other components within normal limits  CBC - Abnormal; Notable for the following components:   RBC 4.16 (*)    Hemoglobin 12.4 (*)    HCT 36.9 (*)    All other components within normal limits  LIPASE, BLOOD  URINALYSIS, ROUTINE W REFLEX MICROSCOPIC    EKG None  Radiology CT Abdomen Pelvis W Contrast  Result Date: 12/04/2020 CLINICAL DATA:  Left lower quadrant pain, diarrhea, and fever 1 day. EXAM: CT ABDOMEN AND PELVIS WITH CONTRAST TECHNIQUE: Multidetector CT imaging of the abdomen and pelvis was performed using the standard protocol following bolus administration of intravenous contrast. CONTRAST:  160mL OMNIPAQUE IOHEXOL 300 MG/ML  SOLN COMPARISON:  04/18/2016 FINDINGS: Lower Chest: No acute findings. Hepatobiliary: Hepatic steatosis has decreased since previous study. Heterogeneous attenuation is seen with suggestion of numerous subtle small poorly defined low-attenuation lesions. Gallbladder is unremarkable. No evidence of biliary ductal dilatation. Pancreas:  No mass or inflammatory changes. Spleen: No evidence of splenomegaly, however multiple small poorly defined low-attenuation lesions are seen in the spleen which measure up to 1.8 cm are new since previous study. Adrenals/Urinary Tract: No masses identified. Small right renal cyst again noted. No evidence of ureteral calculi or hydronephrosis. Stomach/Bowel: Mild pericolonic soft tissue stranding is seen adjacent to the proximal sigmoid colon which is new since previous study, however there is no evidence diverticular disease or colonic wall thickening. This is suspicious for epiploic appendagitis. No other inflammatory  process or abnormal fluid collections identified. No evidence of bowel obstruction. Vascular/Lymphatic: No pathologically enlarged lymph nodes. No evidence of thrombosis of hepatic, splenic, or mesenteric vessels. No abdominal aortic aneurysm. Reproductive:  No mass or other significant abnormality. Other:  None. Musculoskeletal:  No suspicious bone lesions identified. IMPRESSION: Mild pericolonic soft tissue stranding adjacent to proximal sigmoid colon, suspicious for epiploic appendagitis. New small low-attenuation splenic lesions, and numerous subtle low-attenuation lesions throughout the liver. These are nonspecific, and differential diagnosis includes infectious and inflammatory etiologies, as well as neoplasm. Recommend correlation with laboratory evaluation, and consider abdomen MRI without and with contrast for further characterization. Electronically Signed   By: Marlaine Hind M.D.   On: 12/04/2020 17:11    Procedures Procedures (including critical care time)  Medications Ordered in ED Medications  lactated ringers bolus 1,000 mL ( Intravenous Stopped 12/04/20 1635)  iohexol (OMNIPAQUE) 300 MG/ML solution 100 mL (100 mLs Intravenous Contrast Given 12/04/20 1639)    ED Course  I have reviewed the triage vital signs and the nursing notes.  Pertinent labs & imaging results that were available during my care of the patient were reviewed by me and considered in my medical decision making (see chart for details).    MDM Rules/Calculators/A&P                          *** Final Clinical Impression(s) / ED Diagnoses Final diagnoses:  Epiploic appendagitis    Rx / DC Orders ED Discharge Orders    None

## 2020-12-04 NOTE — ED Notes (Signed)
To restroom for urine spec

## 2020-12-04 NOTE — ED Triage Notes (Signed)
Pt reports LLQ pain since yesterday. States he has hx of IBS and has had some diarrhea. He had covid in December and had a negative covid test 2 days ago.

## 2020-12-04 NOTE — ED Provider Notes (Signed)
[facility] EMERGENCY DEPARTMENT Provider Note   CSN: [id number] Arrival date & time: 12/04/20  1317     History Chief Complaint  Patient presents with  . Abdominal Pain    Christopher [name] is a 45 y.o. man who presents today for evaluation of abdominal pain.  He reports that he has had left lower quadrant abdominal pain since yesterday. He did recently have Covid, he finished his quarantine and had a negative test 2 days ago. He states that he has had some diarrhea and that is unchanged from baseline.  He denies any blood in his diarrhea.  He is not having any fevers and feels like, aside from coughing, but he is recovered from Covid.  He denies any trauma.  No dysuria, increased frequency or urgency.  No testicular pain.  No history of kidney stones.  He does note that from his IBS he does have changes in bowel habits however this is at his normal baseline currently. He states that while he still does drink alcohol he is drinking less alcohol than he used to.  His pain does not radiate or move from his left lower quadrant.  It is made worse with movement and coughing.  Unchanged with heat pad.  Made better with being still.  HPI     Past Medical History:  Diagnosis Date  . GERD (gastroesophageal reflux disease)   . H/O hiatal hernia   . Hepatic steatosis   . Hyperlipidemia   . Hypertension   . IBS (irritable bowel syndrome)   . Kidney cyst, acquired   . Migraine    "probably monthly" (07/22/2014)  . 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 Pain in lower limb 03/02/2014  . Pancreatitis     Patient Active Problem List   Diagnosis Date Noted  . COVID-19 12/02/2020  . Memory changes 05/28/2019  . Skin sensation disturbance 05/28/2019  . Hyperglycemia 05/27/2019  . History of gout 08/23/2017  . Erectile dysfunction 08/23/2017  . History of pancreatitis 02/16/2017  . Skin lesions 08/06/2015  . Renal mass, right 07/31/2014  . Alcohol abuse 07/22/2014  . Fatty liver 07/22/2014   . Equinus deformity of foot, acquired 03/02/2014  . 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 ALLERGIC RHINITIS DUE TO POLLEN 02/14/2010  . Essential hypertension 03/09/2008  . Hypertriglyceridemia 08/16/2007  . GERD 08/16/2007  . IBS (irritable bowel syndrome) 08/15/2007    Past Surgical History:  Procedure Laterality Date  . CARPAL TUNNEL RELEASE Right 2008  . COLONOSCOPY     IBS  . CYST EXCISION Right 2015   "2nd toe"  . KNEE ARTHROSCOPY Right 1992  . LASIK    . REFRACTIVE SURGERY Bilateral 2013  . SHOULDER SURGERY  10/2016  . UPPER GASTROINTESTINAL ENDOSCOPY    . UPPER GI ENDOSCOPY     chest pain  . WRIST FRACTURE SURGERY Right 2008   "got plate and screws"       Family History  Problem Relation Age of Onset  . Hypertension Mother   . Hyperlipidemia Mother   . Depression Mother   . Hypertension Father   . Alzheimer's disease Maternal Grandmother   . Alcohol abuse Maternal Grandfather   . Heart disease Maternal Grandfather   . Heart failure Maternal Grandfather   . Alzheimer's disease Paternal Grandmother   . Alcohol abuse Paternal Grandfather   . Depression Sister   . Heart disease Maternal Uncle   . Colon cancer Neg Hx     Social History   Tobacco Use  .  Smoking status: Never Smoker  . Smokeless tobacco: Never Used  Substance Use Topics  . Alcohol use: Yes    Comment: 5-6 drinks once a week  . Drug use: No    Home Medications Prior to Admission medications   Medication Sig Start Date End Date Taking? Authorizing Provider  HYDROcodone-acetaminophen (NORCO/VICODIN) 5-325 MG tablet Take 1 tablet by mouth every 6 (six) hours as needed for severe pain. 12/04/20  Yes Lorin Glass, PA-C  amLODipine (NORVASC) 10 MG tablet TAKE 1 TABLET BY MOUTH  DAILY 04/23/20   [name], MD  benzonatate (TESSALON) 200 MG capsule Take 1 capsule (200 mg total) by mouth 3 (three) times daily as needed for cough. 12/02/20   Hoyt Koch, MD  clidinium-chlordiazePOXIDE  (LIBRAX) 5-2.5 MG capsule Take 1 capsule by mouth daily. 10/27/20   [name], MD  cyclobenzaprine (FLEXERIL) 10 MG tablet Take 1 tablet (10 mg total) by mouth 3 (three) times daily as needed for muscle spasms. 06/22/20   Lyndal Pulley, DO  fenofibrate (TRICOR) 145 MG tablet TAKE 1 TABLET BY MOUTH  DAILY 09/07/20   [name], MD  losartan-hydrochlorothiazide (HYZAAR) 100-25 MG tablet Take 1 tablet by mouth daily. 10/08/20   [name], MD  metoprolol tartrate (LOPRESSOR) 100 MG tablet Take 1 tablet by mouth once for procedure. 08/03/20   O'NealCassie Freer, MD  Multiple Vitamin (MULTIVITAMIN WITH MINERALS) TABS tablet Take 1 tablet by mouth daily.    [provider]  pantoprazole (PROTONIX) 40 MG tablet TAKE 1 TABLET BY MOUTH  DAILY 09/21/20   [name], MD    Allergies    Morphine and related  Review of Systems   Review of Systems  Constitutional: Negative for activity change and fever.  Eyes: Negative for visual disturbance.  Respiratory: Positive for cough. Negative for shortness of breath.   Cardiovascular: Negative for chest pain and leg swelling.  Gastrointestinal: Positive for abdominal pain and diarrhea. Negative for blood in stool, nausea, rectal pain and vomiting.  Genitourinary: Negative for difficulty urinating, dysuria, flank pain, frequency, hematuria, penile discharge, testicular pain and urgency.  Musculoskeletal: Negative for back pain and neck pain.  Skin: Negative for color change.  Neurological: Negative for weakness and headaches.  All other systems reviewed and are negative.   Physical Exam Updated Vital Signs BP (!) 142/89   Pulse 71   Temp 98.2 F (36.8 C) (Oral)   Resp 16   Ht 6' (1.829 m)   Wt 99.8 kg   SpO2 99%   BMI 29.84 kg/m   Physical Exam Vitals and nursing note reviewed.  Constitutional:      General: He is not in acute distress.    Appearance: He is not diaphoretic.  HENT:     Head: Normocephalic and  atraumatic.  Eyes:     General: No scleral icterus.       Right eye: No discharge.        Left eye: No discharge.     Conjunctiva/sclera: Conjunctivae normal.  Cardiovascular:     Rate and Rhythm: Normal rate and regular rhythm.  Pulmonary:     Effort: Pulmonary effort is normal. No respiratory distress.     Breath sounds: No stridor.     Comments: Occasional dry cough while in the room.  No increased respiratory effort or respiratory distress. Abdominal:     General: Abdomen is flat. Bowel sounds are normal. There is no distension.  Palpations: Abdomen is soft.     Tenderness: There is abdominal tenderness in the left lower quadrant. There is guarding.     Hernia: No hernia is present.  Musculoskeletal:        General: No deformity.     Cervical back: Normal range of motion.  Skin:    General: Skin is warm and dry.  Neurological:     Mental Status: He is alert.     Motor: No abnormal muscle tone.  Psychiatric:        Mood and Affect: Mood normal.        Behavior: Behavior normal.     ED Results / Procedures / Treatments   Labs (all labs ordered are listed, but only abnormal results are displayed) Labs Reviewed  COMPREHENSIVE METABOLIC PANEL - Abnormal; Notable for the following components:      Result Value   Glucose, Bld 135 (*)    AST 42 (*)    ALT 49 (*)    All other components within normal limits  CBC - Abnormal; Notable for the following components:   RBC 4.16 (*)    Hemoglobin 12.4 (*)    HCT 36.9 (*)    All other components within normal limits  LIPASE, BLOOD  URINALYSIS, ROUTINE W REFLEX MICROSCOPIC    EKG None  Radiology CT Abdomen Pelvis W Contrast  Result Date: 12/04/2020 CLINICAL DATA:  Left lower quadrant pain, diarrhea, and fever 1 day. EXAM: CT ABDOMEN AND PELVIS WITH CONTRAST TECHNIQUE: Multidetector CT imaging of the abdomen and pelvis was performed using the standard protocol following bolus administration of intravenous contrast.  CONTRAST:  143mL OMNIPAQUE IOHEXOL 300 MG/ML  SOLN COMPARISON:  04/18/2016 FINDINGS: Lower Chest: No acute findings. Hepatobiliary: Hepatic steatosis has decreased since previous study. Heterogeneous attenuation is seen with suggestion of numerous subtle small poorly defined low-attenuation lesions. Gallbladder is unremarkable. No evidence of biliary ductal dilatation. Pancreas:  No mass or inflammatory changes. Spleen: No evidence of splenomegaly, however multiple small poorly defined low-attenuation lesions are seen in the spleen which measure up to 1.8 cm are new since previous study. Adrenals/Urinary Tract: No masses identified. Small right renal cyst again noted. No evidence of ureteral calculi or hydronephrosis. Stomach/Bowel: Mild pericolonic soft tissue stranding is seen adjacent to the proximal sigmoid colon which is new since previous study, however there is no evidence diverticular disease or colonic wall thickening. This is suspicious for epiploic appendagitis. No other inflammatory process or abnormal fluid collections identified. No evidence of bowel obstruction. Vascular/Lymphatic: No pathologically enlarged lymph nodes. No evidence of thrombosis of hepatic, splenic, or mesenteric vessels. No abdominal aortic aneurysm. Reproductive:  No mass or other significant abnormality. Other:  None. Musculoskeletal:  No suspicious bone lesions identified. IMPRESSION: Mild pericolonic soft tissue stranding adjacent to proximal sigmoid colon, suspicious for epiploic appendagitis. New small low-attenuation splenic lesions, and numerous subtle low-attenuation lesions throughout the liver. These are nonspecific, and differential diagnosis includes infectious and inflammatory etiologies, as well as neoplasm. Recommend correlation with laboratory evaluation, and consider abdomen MRI without and with contrast for further characterization. Electronically Signed   By: Marlaine Hind M.D.   On: 12/04/2020 17:11     Procedures Procedures (including critical care time)  Medications Ordered in ED Medications  lactated ringers bolus 1,000 mL ( Intravenous Stopped 12/04/20 1635)  iohexol (OMNIPAQUE) 300 MG/ML solution 100 mL (100 mLs Intravenous Contrast Given 12/04/20 1639)    ED Course  I have reviewed the triage vital signs and the  nursing notes.  Pertinent labs & imaging results that were available during my care of the patient were reviewed by me and considered in my medical decision making (see chart for details).    MDM Rules/Calculators/A&P                          Patient is a 45 year old man who presents today for evaluation of left lower quadrant abdominal pain.  Chart review shows he has not had a CT scan in the past few years.  Given that his pain is left lower quadrant, combined with his age concern for diverticulitis. He does not have a history therefore CT scan is indicated. Labs are obtained and reviewed, he does have mild anemia with a hemoglobin of 12.4 however denies blood in his bowel movements.  CMP shows mild transaminitis which is improved however is otherwise unremarkable.  Urine without evidence of infection or blood and lipase is normal.  He is afebrile not tachycardic or tachypneic and 99 to 100% on room air. He is given IV fluids. CT scan of abdomen and pelvis shows concern for epiploic appendagitis.  No evidence of diverticulitis or diverticulosis.  Additionally I discussed with patient that he has both splenic and liver lesions.  We discussed that he needs to follow-up with his primary care doctor.  He states he already has an appointment scheduled with them. We discussed that he most likely needs additional evaluation of these to exclude neoplasm and he states his understanding.  We discussed pain management.  Given that he frequently coughs and that increases his pain [place] PMP is consulted, he is given a prescription for short course of Vicodin to take at night  to help with his pain.  Return precautions were discussed with patient who states their understanding.  At the time of discharge patient denied any unaddressed complaints or concerns.  Patient is agreeable for discharge home.  Note: Portions of this report may have been transcribed using voice recognition software. Every effort was made to ensure accuracy; however, inadvertent computerized transcription errors may be present    Final Clinical Impression(s) / ED Diagnoses Final diagnoses:  Epiploic appendagitis  Lesion of spleen  Lesion of liver    Rx / DC Orders ED Discharge Orders         Ordered    HYDROcodone-acetaminophen (NORCO/VICODIN) 5-325 MG tablet  Every 6 hours PRN        12/04/20 1818           Lorin Glass, PA-C 12/05/20 0011    Lennice Sites, DO 12/05/20 1457

## 2020-12-04 NOTE — Discharge Instructions (Addendum)
As we discussed today your CT scan had abnormal lesions on your liver and spleen.  You need to follow up with your PCP about these.   You are being prescribed a medication which may make you sleepy. For 24 hours after one dose please do not drive, operate heavy machinery, care for a small child with out another adult present, or perform any activities that may cause harm to you or someone else if you were to fall asleep or be impaired.   Please take Ibuprofen (Advil, motrin) and Tylenol (acetaminophen) to relieve your pain.    You may take up to 600 MG (3 pills) of normal strength ibuprofen every 8 hours as needed.   You make take tylenol, up to 1,000 mg (two extra strength pills) every 8 hours as needed.   It is safe to take ibuprofen and tylenol at the same time as they work differently.   Do not take more than 3,000 mg tylenol in a 24 hour period (not more than one dose every 8 hours.  Please check all medication labels as many medications such as pain and cold medications may contain tylenol.  Do not drink alcohol while taking these medications.  Do not take other NSAID'S while taking ibuprofen (such as aleve or naproxen).  Please take ibuprofen with food to decrease stomach upset.  IMPRESSION:  Mild pericolonic soft tissue stranding adjacent to proximal sigmoid  colon, suspicious for epiploic appendagitis.     New small low-attenuation splenic lesions, and numerous subtle  low-attenuation lesions throughout the liver. These are nonspecific,  and differential diagnosis includes infectious and inflammatory  etiologies, as well as neoplasm. Recommend correlation with  laboratory evaluation, and consider abdomen MRI without and with  contrast for further characterization.

## 2020-12-06 ENCOUNTER — Telehealth: Payer: Self-pay | Admitting: Internal Medicine

## 2020-12-06 ENCOUNTER — Encounter: Payer: Self-pay | Admitting: Internal Medicine

## 2020-12-06 DIAGNOSIS — D7389 Other diseases of spleen: Secondary | ICD-10-CM

## 2020-12-06 DIAGNOSIS — R1900 Intra-abdominal and pelvic swelling, mass and lump, unspecified site: Secondary | ICD-10-CM

## 2020-12-06 DIAGNOSIS — R160 Hepatomegaly, not elsewhere classified: Secondary | ICD-10-CM

## 2020-12-06 NOTE — Telephone Encounter (Signed)
[facility] FYI   Caller states he has lower abdominal pain. He recently had covid and finished his quarantine period.   [facility] advised: See PCP within 24 hours  Patient went to ED on 1.8.22.   Patient was told to follow up with PCP.   Patient is scheduled for an appointment on 1.21.22.

## 2020-12-10 ENCOUNTER — Other Ambulatory Visit: Payer: Self-pay | Admitting: Internal Medicine

## 2020-12-16 ENCOUNTER — Other Ambulatory Visit: Payer: Self-pay

## 2020-12-16 NOTE — Progress Notes (Signed)
Subjective:    Patient ID: Christopher [name], male    DOB: 1976-01-18, 45 y.o.   MRN: [id number]  HPI The patient is here for follow up of their chronic medical problems, including htn, GERD, hypertriglyceridemia, fatty liver, hyperglycemia   He had covid between christmas and new years.  He still has a lot of congestion.  His cough is almost completely gone.  Tried mucinex, dayquil, nyquil and nasacort.    He went to the ED 1/8 for abdominal pain - LLQ.  He has chronic diarrhea, no blood.  CT showed epiploic appendagitis.  CT also showed liver and splenic lesions - infectious and inflammatory etiologies, or neoplasm.  MRI was ordered and is scheduled.   The pain in his left lower quadrant was intermittent.  Every once in a while he gets a twinge of pain.    Still drinking alcohol.  2-3 nights a week and 3-4 beers.  He did drink more over the holidays.     He has some ED.  He did go to the [facility] clinic in [facility].  He has some prostate issues as well.  He sometimes has difficulty emptying his bladder and feels he has difficulty urinating. He states they did test his testosterone level and told him it was low and recommended injections. He states he did try some things for ED, but did not work well.  He is not exercising.      Medications and allergies reviewed with patient and updated if appropriate.  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 60 capsule 0   clidinium-chlordiazePOXIDE (LIBRAX) 5-2.5 MG capsule Take 1 capsule by mouth daily. 90 capsule 0   cyclobenzaprine (FLEXERIL) 10 MG tablet Take 1 tablet (10 mg total) by mouth 3 (three) times daily as needed for muscle spasms. 30 tablet 0   fenofibrate (TRICOR) 145 MG tablet TAKE 1 TABLET BY MOUTH  DAILY 90 tablet 2   losartan-hydrochlorothiazide (HYZAAR) 100-25 MG tablet Take 1 tablet by mouth daily. 90 tablet 1   Multiple Vitamin (MULTIVITAMIN WITH MINERALS) TABS tablet Take 1 tablet by mouth daily.     pantoprazole (PROTONIX) 40 MG tablet TAKE 1 TABLET BY MOUTH  DAILY 90 tablet 3   No current facility-administered medications on file prior to visit.    Past Medical History:  Diagnosis Date   GERD (gastroesophageal reflux disease)    H/O hiatal hernia    Hepatic steatosis    Hyperlipidemia    Hypertension    IBS (irritable bowel syndrome)    Kidney cyst, acquired  Migraine    "probably monthly" (07/22/2014)   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/02/2014   Pain in lower limb 03/02/2014   Pancreatitis     Past Surgical History:  Procedure Laterality Date   CARPAL TUNNEL RELEASE Right 2008   COLONOSCOPY     IBS   CYST EXCISION Right 2015   "2nd toe"   KNEE ARTHROSCOPY Right 1992   LASIK     REFRACTIVE SURGERY Bilateral 2013   SHOULDER SURGERY  10/2016   UPPER GASTROINTESTINAL ENDOSCOPY     UPPER GI ENDOSCOPY     chest pain   WRIST FRACTURE SURGERY Right 2008   "got plate and screws"    Social History   Socioeconomic History   Marital status: Married    Spouse name: Not on file    Number of children: 2   Years of education: Not on file   Highest education level: Not on file  Occupational History   Not on file  Tobacco Use   Smoking status: Never Smoker   Smokeless tobacco: Never Used  Substance and Sexual Activity   Alcohol use: Yes    Comment: 5-6 drinks once a week   Drug use: No   Sexual activity: Yes  Other Topics Concern   Not on file  Social History Narrative   Married, second wife, daughter born in 57, son born in 2000   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[place]   3 caffeinated beverages daily   Social Determinants of Health   Financial Resource Strain: Not on file  Food Insecurity: Not on file  Transportation Needs: Not on file  Physical Activity: Not on file  Stress: Not on file  Social Connections: Not on file    Family History  Problem Relation Age of Onset   Hypertension Mother    Hyperlipidemia Mother    Depression Mother    Hypertension Father    Alzheimer's disease Maternal Grandmother    Alcohol abuse Maternal Grandfather    Heart disease Maternal Grandfather    Heart failure Maternal Grandfather    Alzheimer's disease Paternal Grandmother    Alcohol abuse Paternal Grandfather    Depression Sister    Heart disease Maternal Uncle    Colon cancer Neg Hx     Review of Systems  Constitutional: Negative for chills and fever.  HENT: Positive for congestion. Negative for ear pain (ear pressure), sinus pressure, sinus pain and sore throat.   Respiratory: Positive for cough (morning - drainage). Negative for shortness of breath and wheezing.   Cardiovascular: Positive for palpitations (occ, transient). Negative for chest pain and leg swelling.  Gastrointestinal: Positive for abdominal pain (tinges of pain in LLQ ) and diarrhea (controlled with librax). Negative for blood in stool, constipation, nausea and vomiting.       No gerd  Genitourinary: Positive for difficulty urinating. Negative for  dysuria and hematuria.  Neurological: Positive for light-headedness (occ - he thinks it is his eating habits). Negative for headaches.       Objective:   Vitals:   12/17/20 0958  BP: 138/86  Pulse: 71  Temp: 98.1 F (36.7 C)  SpO2: 98%   BP Readings from Last 3 Encounters:  12/17/20 138/86  12/04/20 (!) 142/89  08/11/20 128/90   Wt Readings from Last 3 Encounters:  12/17/20 216 lb 3.2 oz (98.1 kg)  12/04/20 220 lb (99.8 kg)  08/11/20 231 lb (104.8 kg)   Body mass index is 29.32 kg/m.   Physical  Exam    Constitutional: Appears well-developed and well-nourished. No distress.  HENT:  Head: Normocephalic and atraumatic.  Neck: Neck supple. No tracheal deviation present. No thyromegaly present.  No cervical lymphadenopathy Cardiovascular: Normal rate, regular rhythm and normal heart sounds.   No murmur heard. No carotid bruit .  No edema Pulmonary/Chest: Effort normal and breath sounds normal. No respiratory distress. No has no wheezes. No rales.  Skin: Skin is warm and dry. Not diaphoretic.  Psychiatric: Normal mood and affect. Behavior is normal.   CT Abdomen Pelvis W Contrast CLINICAL DATA:  Left lower quadrant pain, diarrhea, and fever 1 day.  EXAM: CT ABDOMEN AND PELVIS WITH CONTRAST  TECHNIQUE: Multidetector CT imaging of the abdomen and pelvis was performed using the standard protocol following bolus administration of intravenous contrast.  CONTRAST:  163mL OMNIPAQUE IOHEXOL 300 MG/ML  SOLN  COMPARISON:  04/18/2016  FINDINGS: Lower Chest: No acute findings.  Hepatobiliary: Hepatic steatosis has decreased since previous study. Heterogeneous attenuation is seen with suggestion of numerous subtle small poorly defined low-attenuation lesions. Gallbladder is unremarkable. No evidence of biliary ductal dilatation.  Pancreas:  No mass or inflammatory changes.  Spleen: No evidence of splenomegaly, however multiple small poorly defined low-attenuation  lesions are seen in the spleen which measure up to 1.8 cm are new since previous study.  Adrenals/Urinary Tract: No masses identified. Small right renal cyst again noted. No evidence of ureteral calculi or hydronephrosis.  Stomach/Bowel: Mild pericolonic soft tissue stranding is seen adjacent to the proximal sigmoid colon which is new since previous study, however there is no evidence diverticular disease or colonic wall thickening. This is suspicious for epiploic appendagitis. No other inflammatory process or abnormal fluid collections identified. No evidence of bowel obstruction.  Vascular/Lymphatic: No pathologically enlarged lymph nodes. No evidence of thrombosis of hepatic, splenic, or mesenteric vessels. No abdominal aortic aneurysm.  Reproductive:  No mass or other significant abnormality.  Other:  None.  Musculoskeletal:  No suspicious bone lesions identified.  IMPRESSION: Mild pericolonic soft tissue stranding adjacent to proximal sigmoid colon, suspicious for epiploic appendagitis.  New small low-attenuation splenic lesions, and numerous subtle low-attenuation lesions throughout the liver. These are nonspecific, and differential diagnosis includes infectious and inflammatory etiologies, as well as neoplasm. Recommend correlation with laboratory evaluation, and consider abdomen MRI without and with contrast for further characterization.  Electronically Signed   By: Marlaine Hind M.D.   On: 12/04/2020 17:11     Assessment & Plan:    See Problem List for Assessment and Plan of chronic medical problems.    This visit occurred during the SARS-CoV-2 public health emergency.  Safety protocols were in place, including screening questions prior to the visit, additional usage of staff PPE, and extensive cleaning of exam room while observing appropriate contact time as indicated for disinfecting solutions.

## 2020-12-16 NOTE — Patient Instructions (Addendum)
  Blood work was ordered.     Medications changes include :   Prednisone 20 mg daily x 5 days.   Your prescription(s) have been submitted to your pharmacy. Please take as directed and contact our office if you believe you are having problem(s) with the medication(s).   A referral was ordered for urology.       Someone from their office will call you to schedule an appointment.    Please followup in 6 months

## 2020-12-17 ENCOUNTER — Ambulatory Visit: Payer: 59 | Admitting: Internal Medicine

## 2020-12-17 ENCOUNTER — Encounter: Payer: Self-pay | Admitting: Internal Medicine

## 2020-12-17 ENCOUNTER — Other Ambulatory Visit: Payer: Self-pay

## 2020-12-17 VITALS — BP 138/86 | HR 71 | Temp 98.1°F | Ht 72.0 in | Wt 216.2 lb

## 2020-12-17 DIAGNOSIS — N529 Male erectile dysfunction, unspecified: Secondary | ICD-10-CM | POA: Diagnosis not present

## 2020-12-17 DIAGNOSIS — E781 Pure hyperglyceridemia: Secondary | ICD-10-CM

## 2020-12-17 DIAGNOSIS — R739 Hyperglycemia, unspecified: Secondary | ICD-10-CM

## 2020-12-17 DIAGNOSIS — I10 Essential (primary) hypertension: Secondary | ICD-10-CM | POA: Diagnosis not present

## 2020-12-17 DIAGNOSIS — E349 Endocrine disorder, unspecified: Secondary | ICD-10-CM | POA: Insufficient documentation

## 2020-12-17 DIAGNOSIS — H6981 Other specified disorders of Eustachian tube, right ear: Secondary | ICD-10-CM | POA: Insufficient documentation

## 2020-12-17 DIAGNOSIS — R7989 Other specified abnormal findings of blood chemistry: Secondary | ICD-10-CM | POA: Insufficient documentation

## 2020-12-17 DIAGNOSIS — K769 Liver disease, unspecified: Secondary | ICD-10-CM | POA: Insufficient documentation

## 2020-12-17 DIAGNOSIS — K219 Gastro-esophageal reflux disease without esophagitis: Secondary | ICD-10-CM

## 2020-12-17 DIAGNOSIS — H6991 Unspecified Eustachian tube disorder, right ear: Secondary | ICD-10-CM | POA: Insufficient documentation

## 2020-12-17 DIAGNOSIS — H6983 Other specified disorders of Eustachian tube, bilateral: Secondary | ICD-10-CM | POA: Insufficient documentation

## 2020-12-17 LAB — COMPREHENSIVE METABOLIC PANEL
ALT: 76 U/L — ABNORMAL HIGH (ref 0–53)
AST: 53 U/L — ABNORMAL HIGH (ref 0–37)
Albumin: 4.7 g/dL (ref 3.5–5.2)
Alkaline Phosphatase: 49 U/L (ref 39–117)
BUN: 14 mg/dL (ref 6–23)
CO2: 30 mEq/L (ref 19–32)
Calcium: 9.9 mg/dL (ref 8.4–10.5)
Chloride: 99 mEq/L (ref 96–112)
Creatinine, Ser: 1.1 mg/dL (ref 0.40–1.50)
GFR: 81.22 mL/min (ref >60.00)
Glucose, Bld: 91 mg/dL (ref 70–99)
Potassium: 3.7 mEq/L (ref 3.5–5.1)
Sodium: 138 mEq/L (ref 135–145)
Total Bilirubin: 0.8 mg/dL (ref 0.2–1.2)
Total Protein: 8.2 g/dL (ref 6.0–8.3)

## 2020-12-17 LAB — LIPID PANEL
Cholesterol: 172 mg/dL (ref 0–200)
HDL: 42.5 mg/dL (ref >39.00)
LDL Cholesterol: 90 mg/dL (ref 0–99)
NonHDL: 129.47
Total CHOL/HDL Ratio: 4
Triglycerides: 198 mg/dL — ABNORMAL HIGH (ref 0.0–149.0)
VLDL: 39.6 mg/dL (ref 0.0–40.0)

## 2020-12-17 LAB — HEMOGLOBIN A1C: Hgb A1c MFr Bld: 5.2 % (ref 4.6–6.5)

## 2020-12-17 MED ORDER — PREDNISONE 20 MG PO TABS: 20.0000 mg | ORAL_TABLET | Freq: Every day | ORAL | 0 refills | Status: DC

## 2020-12-17 NOTE — Assessment & Plan Note (Signed)
Acute Associated with significant congestion Has had the symptoms since COVID Not likely to be bacterial in nature Will try prednisone 20 mg daily for 5 days Continue nasal sprays no over-the-counter cold medications

## 2020-12-17 NOTE — Assessment & Plan Note (Signed)
New problem Also with some scaly lesions Seen on CT scan from the emergency room MRI has been ordered and he is scheduled to have that next week CMP, hepatitis B antibody, hepatitis C antibody Depending on MRI results will determine further work-up

## 2020-12-17 NOTE — Assessment & Plan Note (Signed)
Chronic Has gone to a men's clinic in [facility] and has tried a few things Would be interested in seeing urology at this time Encouraged alcohol cessation Refer to urology

## 2020-12-17 NOTE — Assessment & Plan Note (Signed)
Chronic BP controlled Continue amlodipine 10 mg daily, losartan-hydrochlorothiazide 100-25 mg daily cmp

## 2020-12-17 NOTE — Assessment & Plan Note (Signed)
Chronic Has known fatty liver Also drinking excessive alcohol at times-stressed decreasing alcohol Discussed the combination of a fatty liver and alcohol use to significantly increase his risk of cirrhosis Recent imaging with lesions in liver-his MRI next week Recheck LFTs and hepatitis C, B

## 2020-12-17 NOTE — Assessment & Plan Note (Signed)
Chronic GERD controlled Continue pantoprazole 40 mg daily 

## 2020-12-17 NOTE — Assessment & Plan Note (Signed)
New Had this tested initially at the [facility] clinic in [facility] and he was told that he had a low level Will recheck today, but it is a little bit later in the day than ideal to check it Will refer to urology given all of his symptoms

## 2020-12-17 NOTE — Assessment & Plan Note (Signed)
Chronic Check lipid panel  Continue fenofibrate 145 mg daily Encouraged decreased alcohol intake-he is making a conscious effort Regular exercise and healthy diet encouraged

## 2020-12-17 NOTE — Assessment & Plan Note (Signed)
Chronic A1c

## 2020-12-20 LAB — TESTOSTERONE, FREE & TOTAL
Free Testosterone: 69.7 pg/mL (ref 35.0–155.0)
Testosterone, Total, LC-MS-MS: 419 ng/dL (ref 250–1100)

## 2020-12-20 LAB — HEPATITIS B SURFACE ANTIBODY,QUALITATIVE: Hep B S Ab: REACTIVE — AB

## 2020-12-20 LAB — HEPATITIS B SURFACE ANTIGEN: Hepatitis B Surface Ag: NONREACTIVE

## 2020-12-20 LAB — HEPATITIS C ANTIBODY
Hepatitis C Ab: NONREACTIVE
SIGNAL TO CUT-OFF: 0.02 (ref <1.00)

## 2020-12-23 ENCOUNTER — Other Ambulatory Visit: Payer: Self-pay

## 2020-12-23 ENCOUNTER — Ambulatory Visit
Admission: RE | Admit: 2020-12-23 | Discharge: 2020-12-23 | Disposition: A | Discharge status: Home / Self Care | Payer: 59 | Source: Ambulatory Visit | Attending: Internal Medicine | Admitting: Internal Medicine

## 2020-12-23 ENCOUNTER — Encounter: Payer: Self-pay | Admitting: Internal Medicine

## 2020-12-23 ENCOUNTER — Telehealth: Payer: Self-pay | Admitting: Internal Medicine

## 2020-12-23 DIAGNOSIS — R1900 Intra-abdominal and pelvic swelling, mass and lump, unspecified site: Secondary | ICD-10-CM

## 2020-12-23 DIAGNOSIS — D7389 Other diseases of spleen: Secondary | ICD-10-CM

## 2020-12-23 DIAGNOSIS — K769 Liver disease, unspecified: Secondary | ICD-10-CM

## 2020-12-23 DIAGNOSIS — R160 Hepatomegaly, not elsewhere classified: Secondary | ICD-10-CM

## 2020-12-23 MED ORDER — GADOBENATE DIMEGLUMINE 529 MG/ML IV SOLN
20.0000 mL | Freq: Once | INTRAVENOUS | Status: AC | PRN
  Administered 2020-12-23: 20 mL via INTRAVENOUS

## 2020-12-23 NOTE — Telephone Encounter (Signed)
error 

## 2020-12-24 ENCOUNTER — Other Ambulatory Visit: Payer: Self-pay | Admitting: Internal Medicine

## 2020-12-24 DIAGNOSIS — K769 Liver disease, unspecified: Secondary | ICD-10-CM

## 2020-12-28 ENCOUNTER — Encounter (HOSPITAL_COMMUNITY): Payer: Self-pay

## 2020-12-28 NOTE — Progress Notes (Unsigned)
[facility]    1       Christopher [name] Male, 45 y.o., 11/30/1975  MRN:  [id number] Phone:  [phone] [name])       PCP:  [name], MD Coverage:  [place] [facility]/[employer] Other  Next Appt With Radiology ([facility] 2) 01/04/2021 at 1:00 PM           RE: Biopsy Received: Today  Message Details  [name], [name], MD  [name] E Approved for ultrasound guided liver biopsy. Diffuse multifocal masses.    [name]    Previous Messages  ----- Message -----  From: [name]  Sent: 12/28/2020 11:59 AM EST  To: Ir Procedure Requests  Subject: Biopsy                      Procedure Requested: US Biopsy ( Liver)    Reason for Procedure: Liver Lesion    Provider Requesting: [name]  Provider Telephone: [phone]    Other Info:

## 2021-01-03 ENCOUNTER — Other Ambulatory Visit: Payer: Self-pay | Admitting: Radiology

## 2021-01-04 ENCOUNTER — Other Ambulatory Visit: Payer: Self-pay

## 2021-01-04 ENCOUNTER — Ambulatory Visit (HOSPITAL_COMMUNITY)
Admission: RE | Admit: 2021-01-04 | Discharge: 2021-01-04 | Disposition: A | Discharge status: Home / Self Care | Payer: 59 | Source: Ambulatory Visit | Attending: Internal Medicine | Admitting: Internal Medicine

## 2021-01-04 ENCOUNTER — Encounter (HOSPITAL_COMMUNITY): Payer: Self-pay

## 2021-01-04 DIAGNOSIS — I10 Essential (primary) hypertension: Secondary | ICD-10-CM | POA: Insufficient documentation

## 2021-01-04 DIAGNOSIS — K760 Fatty (change of) liver, not elsewhere classified: Secondary | ICD-10-CM | POA: Diagnosis not present

## 2021-01-04 DIAGNOSIS — Z79899 Other long term (current) drug therapy: Secondary | ICD-10-CM | POA: Insufficient documentation

## 2021-01-04 DIAGNOSIS — E785 Hyperlipidemia, unspecified: Secondary | ICD-10-CM | POA: Diagnosis not present

## 2021-01-04 DIAGNOSIS — K589 Irritable bowel syndrome without diarrhea: Secondary | ICD-10-CM | POA: Insufficient documentation

## 2021-01-04 DIAGNOSIS — K753 Granulomatous hepatitis, not elsewhere classified: Secondary | ICD-10-CM | POA: Insufficient documentation

## 2021-01-04 DIAGNOSIS — K769 Liver disease, unspecified: Secondary | ICD-10-CM

## 2021-01-04 DIAGNOSIS — R7989 Other specified abnormal findings of blood chemistry: Secondary | ICD-10-CM | POA: Diagnosis present

## 2021-01-04 LAB — COMPREHENSIVE METABOLIC PANEL
ALT: 69 U/L — ABNORMAL HIGH (ref 0–44)
AST: 43 U/L — ABNORMAL HIGH (ref 15–41)
Albumin: 4.4 g/dL (ref 3.5–5.0)
Alkaline Phosphatase: 42 U/L (ref 38–126)
Anion gap: 11 (ref 5–15)
BUN: 17 mg/dL (ref 6–20)
CO2: 29 mmol/L (ref 22–32)
Calcium: 9.3 mg/dL (ref 8.9–10.3)
Chloride: 103 mmol/L (ref 98–111)
Creatinine, Ser: 1.03 mg/dL (ref 0.61–1.24)
GFR, Estimated: >60 mL/min (ref >60)
Glucose, Bld: 95 mg/dL (ref 70–99)
Potassium: 3.6 mmol/L (ref 3.5–5.1)
Sodium: 143 mmol/L (ref 135–145)
Total Bilirubin: 0.8 mg/dL (ref 0.3–1.2)
Total Protein: 7.5 g/dL (ref 6.5–8.1)

## 2021-01-04 LAB — CBC WITH DIFFERENTIAL/PLATELET
Abs Immature Granulocytes: 0.03 10*3/uL (ref 0.00–0.07)
Basophils Absolute: 0.1 10*3/uL (ref 0.0–0.1)
Basophils Relative: 1 %
Eosinophils Absolute: 0.3 10*3/uL (ref 0.0–0.5)
Eosinophils Relative: 6 %
HCT: 41.5 % (ref 39.0–52.0)
Hemoglobin: 13.6 g/dL (ref 13.0–17.0)
Immature Granulocytes: 1 %
Lymphocytes Relative: 26 %
Lymphs Abs: 1.3 10*3/uL (ref 0.7–4.0)
MCH: 29 pg (ref 26.0–34.0)
MCHC: 32.8 g/dL (ref 30.0–36.0)
MCV: 88.5 fL (ref 80.0–100.0)
Monocytes Absolute: 0.5 10*3/uL (ref 0.1–1.0)
Monocytes Relative: 10 %
Neutro Abs: 2.8 10*3/uL (ref 1.7–7.7)
Neutrophils Relative %: 56 %
Platelets: 347 10*3/uL (ref 150–400)
RBC: 4.69 MIL/uL (ref 4.22–5.81)
RDW: 12.8 % (ref 11.5–15.5)
WBC: 4.9 10*3/uL (ref 4.0–10.5)
nRBC: 0 % (ref 0.0–0.2)

## 2021-01-04 LAB — PROTIME-INR
INR: 1 (ref 0.8–1.2)
Prothrombin Time: 12.6 seconds (ref 11.4–15.2)

## 2021-01-04 MED ORDER — GELATIN ABSORBABLE 12-7 MM EX MISC
CUTANEOUS | Status: AC
  Filled 2021-01-04: qty 1

## 2021-01-04 MED ORDER — MIDAZOLAM HCL 2 MG/2ML IJ SOLN
INTRAMUSCULAR | Status: AC | PRN
Start: 2021-01-04 — End: 2021-01-04
  Administered 2021-01-04: 2 mg via INTRAVENOUS
  Administered 2021-01-04: 1 mg via INTRAVENOUS

## 2021-01-04 MED ORDER — FENTANYL CITRATE (PF) 100 MCG/2ML IJ SOLN
INTRAMUSCULAR | Status: AC | PRN
  Administered 2021-01-04 (×2): 50 ug via INTRAVENOUS

## 2021-01-04 MED ORDER — FENTANYL CITRATE (PF) 100 MCG/2ML IJ SOLN
INTRAMUSCULAR | Status: AC
  Filled 2021-01-04: qty 2

## 2021-01-04 MED ORDER — DIPHENHYDRAMINE HCL 50 MG/ML IJ SOLN
INTRAMUSCULAR | Status: AC
  Filled 2021-01-04: qty 1

## 2021-01-04 MED ORDER — DIPHENHYDRAMINE HCL 50 MG/ML IJ SOLN
INTRAMUSCULAR | Status: AC | PRN
  Administered 2021-01-04: 25 mg via INTRAVENOUS

## 2021-01-04 MED ORDER — MIDAZOLAM HCL 2 MG/2ML IJ SOLN
INTRAMUSCULAR | Status: AC
  Filled 2021-01-04: qty 4

## 2021-01-04 MED ORDER — SODIUM CHLORIDE 0.9 % IV SOLN: INTRAVENOUS | Status: DC

## 2021-01-04 MED ORDER — LIDOCAINE HCL 1 % IJ SOLN
INTRAMUSCULAR | Status: AC
  Filled 2021-01-04: qty 20

## 2021-01-04 NOTE — H&P (Signed)
Chief Complaint: Patient was seen in consultation today for image guided liver biopsy at the request of Burns,Stacy J  Referring Physician(s): Burns,Stacy J  Supervising Physician: Jacqulynn Cadet  Patient Status: [facility] - Out-pt  History of Present Illness: Christopher [name] is a 45 y.o. male with PMH of HTN, hyperlipidemia, hiatal hernia, pancreatitis, chronically elvevated LFTs, and IBS presents to IR for image guided liver biopsy today.   Patient states that he went to [facility] on 12/04/20 due to acute severe LLQ abdominal pain. CT abdomen revealed multiple low-attenuation lesions throughout the liver. He was discharged from ER with follow up visit scheduled with his PCP for further evaluation of the liver lesion.  During visit with his PCP, he received lab works including CMP, Hep C antibody, Hep B surface antigen and antibody. Hep B and C came back negative, and CMP showed elevated LFTs from his baseline.  Patient also underwent for MRI abdomen on 12/23/20 which revealed diffuse miliary pattern of disease throughout the liver, and possible early fibrotic changes. He was referred to IR for image guided liver biopsy for further evaluation of the lesions.   Upon imagings and chart review, the procedure was approved and patient was scheduled to the liver biopsy.   Patient states that he is in his normal status of health today, denies fever, chills, chest pain  abdominal pain, nausea or vomiting. He reports mild headache but he related it to possible dehydration since he did not have anything to eat or drink since last night.   Past Medical History:  Diagnosis Date  . GERD (gastroesophageal reflux disease)   . H/O hiatal hernia   . Hepatic steatosis   . Hyperlipidemia   . Hypertension   . IBS (irritable bowel syndrome)   . Kidney cyst, acquired   . Migraine    "probably monthly" (07/22/2014)  . 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 Pain in lower limb 03/02/2014  . Pancreatitis      Past Surgical History:  Procedure Laterality Date  . CARPAL TUNNEL RELEASE Right 2008  . COLONOSCOPY     IBS  . CYST EXCISION Right 2015   "2nd toe"  . KNEE ARTHROSCOPY Right 1992  . LASIK    . REFRACTIVE SURGERY Bilateral 2013  . SHOULDER SURGERY  10/2016  . UPPER GASTROINTESTINAL ENDOSCOPY    . UPPER GI ENDOSCOPY     chest pain  . WRIST FRACTURE SURGERY Right 2008   "got plate and screws"    Allergies: Morphine and related  Medications: Prior to Admission medications   Medication Sig Start Date End Date Taking? Authorizing Provider  amLODipine (NORVASC) 10 MG tablet TAKE 1 TABLET BY MOUTH  DAILY 12/10/20  Yes Burns, Claudina Lick, MD  benzonatate (TESSALON) 200 MG capsule Take 1 capsule (200 mg total) by mouth 3 (three) times daily as needed for cough. 12/02/20  Yes Hoyt Koch, MD  clidinium-chlordiazePOXIDE (LIBRAX) 5-2.5 MG capsule Take 1 capsule by mouth daily. 10/27/20  Yes Burns, Claudina Lick, MD  fenofibrate (TRICOR) 145 MG tablet TAKE 1 TABLET BY MOUTH  DAILY 09/07/20  Yes Burns, Claudina Lick, MD  losartan-hydrochlorothiazide (HYZAAR) 100-25 MG tablet Take 1 tablet by mouth daily. 10/08/20  Yes Burns, Claudina Lick, MD  Multiple Vitamin (MULTIVITAMIN WITH MINERALS) TABS tablet Take 1 tablet by mouth daily.   Yes [provider]  pantoprazole (PROTONIX) 40 MG tablet TAKE 1 TABLET BY MOUTH  DAILY 09/21/20  Yes Burns, Claudina Lick, MD  cyclobenzaprine (FLEXERIL)  10 MG tablet Take 1 tablet (10 mg total) by mouth 3 (three) times daily as needed for muscle spasms. 06/22/20   Lyndal Pulley, DO  predniSONE (DELTASONE) 20 MG tablet Take 1 tablet (20 mg total) by mouth daily with breakfast. 12/17/20   [name], MD     Family History  Problem Relation Age of Onset  . Hypertension Mother   . Hyperlipidemia Mother   . Depression Mother   . Hypertension Father   . Alzheimer's disease Maternal Grandmother   . Alcohol abuse Maternal Grandfather   . Heart disease Maternal  Grandfather   . Heart failure Maternal Grandfather   . Alzheimer's disease Paternal Grandmother   . Alcohol abuse Paternal Grandfather   . Depression Sister   . Heart disease Maternal Uncle   . Colon cancer Neg Hx     Social History   Socioeconomic History  . Marital status: Married    Spouse name: Not on file  . Number of children: 2  . Years of education: Not on file  . Highest education level: Not on file  Occupational History  . Not on file  Tobacco Use  . Smoking status: Never Smoker  . Smokeless tobacco: Never Used  Substance and Sexual Activity  . Alcohol use: Yes    Comment: 5-6 drinks once a week  . Drug use: No  . Sexual activity: Yes  Other Topics Concern  . 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  All other systems are negative.  Review of Systems  Constitutional: Negative for chills, fatigue and fever.  Respiratory: Negative for cough, chest tightness and shortness of breath.   Cardiovascular: Negative for chest pain and palpitations.  Gastrointestinal: Negative for abdominal distention, abdominal pain, nausea and vomiting.  Neurological: Positive for headaches.       Mild headache, patient relates it to dehydration since he has been NPO since last night.   Psychiatric/Behavioral: Negative for confusion.    Vital Signs: BP (!) 159/103 (BP Location: Right Arm)   Pulse 71   Temp 97.8 F (36.6 C) (Oral)   Resp 20   SpO2 100%   Physical Exam Constitutional:       Appearance: Normal appearance.  Cardiovascular:     Rate and Rhythm: Normal rate and regular rhythm.     Pulses: Normal pulses.     Heart sounds: Normal heart sounds.  Pulmonary:     Effort: Pulmonary effort is normal.     Breath sounds: Normal breath sounds.  Abdominal:     General: Bowel sounds are normal.     Palpations: Abdomen is soft.  Neurological:     Mental Status: He is alert and oriented to person, place, and time.  Psychiatric:        Mood and Affect: Mood normal.        Behavior: Behavior normal.        Thought Content: Thought content normal.  Judgment: Judgment normal.     Imaging: MR Abdomen W Wo Contrast  Result Date: 12/23/2020 CLINICAL DATA:  Indeterminate hepatic and splenic lesions on recent CT. EXAM: MRI ABDOMEN WITHOUT AND WITH CONTRAST TECHNIQUE: Multiplanar multisequence MR imaging of the abdomen was performed both before and after the administration of intravenous contrast. CONTRAST:  68mL MULTIHANCE GADOBENATE DIMEGLUMINE 529 MG/ML IV SOLN COMPARISON:  CT on 12/04/2020 FINDINGS: Lower chest: No acute findings. Hepatobiliary: Moderate diffuse hepatic steatosis is seen. There are innumerable tiny sub-cm hypovascular lesions throughout both the right and left lobes in a miliary pattern which show delayed enhancement. In addition, there are other numerous ill-defined areas hyperenhancement which could be due to fibrosis. No gross morphologic features of cirrhosis are seen however. Gallbladder is unremarkable. No evidence of biliary ductal dilatation. Pancreas:  No mass or inflammatory changes. Spleen: No evidence of splenomegaly. Several small splenic lesions are seen measuring up to 1.2 cm. These show T2 hypointensity and delayed contrast enhancement, similar to the liver lesions described above. Adrenals/Urinary Tract: No masses identified. Several small renal cysts noted bilaterally. No evidence of hydronephrosis. Stomach/Bowel: Visualized portion unremarkable.  Vascular/Lymphatic: No pathologically enlarged lymph nodes identified. No abdominal aortic aneurysm. Other:  None. Musculoskeletal:  No suspicious bone lesions identified. IMPRESSION: Diffuse hepatic steatosis. Diffuse miliary pattern of disease throughout the liver, and possible early fibrotic changes. Similar but fewer splenic lesions also seen. Differential diagnosis includes infection, sarcoidosis/granulomatous disease, and less likely metastatic disease. Consider ultrasound-guided liver biopsy for tissue diagnosis. Electronically Signed   By: Marlaine Hind M.D.   On: 12/23/2020 16:05    Labs:  CBC: Recent Labs    06/01/20 0855 12/04/20 1410 01/04/21 1144  WBC 5.3 5.6 4.9  HGB 14.2 12.4* 13.6  HCT 41.0 36.9* 41.5  PLT 335.0 354 347    COAGS: Recent Labs    01/04/21 1144  INR 1.0    BMP: Recent Labs    06/01/20 0855 07/08/20 0938 12/04/20 1410 12/17/20 1045  NA 142 139 140 138  K 4.0 4.2 3.5 3.7  CL 104 101 101 99  CO2 27 26 25 30   GLUCOSE 109* 93 135* 91  BUN 11 11 15 14   CALCIUM 9.5 9.6 9.0 9.9  CREATININE 1.12 0.99 1.15 1.10  GFRNONAA  --  92 >60  --   GFRAA  --  107  --   --     LIVER FUNCTION TESTS: Recent Labs    06/01/20 0855 12/04/20 1410 12/17/20 1045  BILITOT 0.6 0.6 0.8  AST 40* 42* 53*  ALT 73* 49* 76*  ALKPHOS 45 48 49  PROT 7.4 7.9 8.2  ALBUMIN 4.5 4.1 4.7    TUMOR MARKERS: No results for input(s): AFPTM, CEA, CA199, CHROMGRNA in the last 8760 hours.  Assessment and Plan: 45 year old male with multiple liver lesions seen in CT and MRI.  He presents to IR today for image guided liver biopsy for further evaluation of liver lesions.   Risks and benefits of image guided liver biopsy was discussed with the patient and/or patient's family including, but not limited to bleeding, infection, damage to adjacent structures or low yield requiring additional tests.  All of the questions were answered and there is agreement to proceed.  Consent  signed and in chart.   Thank you for this interesting consult.  I greatly enjoyed meeting [facility] and look forward to participating in their care.  A copy of this report was sent to the requesting provider  on this date.  Electronically Signed: Tera Mater, PA-C 01/04/2021, 12:35 PM   I spent a total of  30 Minutes   in face to face in clinical consultation, greater than 50% of which was counseling/coordinating care for image guided liver biopsy.

## 2021-01-04 NOTE — Discharge Instructions (Signed)
Please call Interventional Radiology clinic [phone] with any questions or concerns.  You may remove your dressing and shower tomorrow.   Liver Biopsy, Care After These instructions give you information on caring for yourself after your procedure. Your doctor may also give you more specific instructions. Call your doctor if you have any problems or questions after your procedure. What can I expect after the procedure? After the procedure, it is common to have:  Pain and soreness where the biopsy was done.  Bruising around the area where the biopsy was done.  Sleepiness and be tired for a few days. Follow these instructions at home: Medicines  Take over-the-counter and prescription medicines only as told by your doctor.  If you were prescribed an antibiotic medicine, take it as told by your doctor. Do not stop taking the antibiotic even if you start to feel better.  Do not take medicines such as aspirin and ibuprofen. These medicines can thin your blood. Do not take these medicines unless your doctor tells you to take them.  If you are taking prescription pain medicine, take actions to prevent or treat constipation. Your doctor may recommend that you: ? Drink enough fluid to keep your pee (urine) clear or pale yellow. ? Take over-the-counter or prescription medicines. ? Eat foods that are high in fiber, such as fresh fruits and vegetables, whole grains, and beans. ? Limit foods that are high in fat and processed sugars, such as fried and sweet foods. Caring for your cut  Follow instructions from your doctor about how to take care of your cuts from surgery (incisions). Make sure you: ? Wash your hands with soap and water before you change your bandage (dressing). If you cannot use soap and water, use hand sanitizer. ? Change your bandage as told by your doctor. ? Leave stitches (sutures), skin glue, or skin tape (adhesive) strips in place. They may need to stay in place for 2  weeks or longer. If tape strips get loose and curl up, you may trim the loose edges. Do not remove tape strips completely unless your doctor says it is okay.  Check your cuts every day for signs of infection. Check for: ? Redness, swelling, or more pain. ? Fluid or blood. ? Pus or a bad smell. ? Warmth.  Do not take baths, swim, or use a hot tub until your doctor says it is okay to do so. Activity  Rest at home for 1-2 days or as told by your doctor. ? Avoid sitting for a long time without moving. Get up to take short walks every 1-2 hours.  Return to your normal activities as told by your doctor. Ask what activities are safe for you.  Do not do these things in the first 24 hours: ? Drive. ? Use machinery. ? Take a bath or shower.  Do not lift more than 10 pounds (4.5 kg) or play contact sports for the first 2 weeks.   General instructions  Do not drink alcohol in the first week after the procedure.  Have someone stay with you for at least 24 hours after the procedure.  Get your test results. Ask your doctor or the department that is doing the test: ? When will my results be ready? ? How will I get my results? ? What are my treatment options? ? What other tests do I need? ? What are my next steps?  Keep all follow-up visits as told by your doctor. This is important.   Contact   a doctor if:  A cut bleeds and leaves more than just a small spot of blood.  A cut is red, puffs up (swells), or hurts more than before.  Fluid or something else comes from a cut.  A cut smells bad.  You have a fever or chills. Get help right away if:  You have swelling, bloating, or pain in your belly (abdomen).  You get dizzy or faint.  You have a rash.  You feel sick to your stomach (nauseous) or throw up (vomit).  You have trouble breathing, feel short of breath, or feel faint.  Your chest hurts.  You have problems talking or seeing.  You have trouble with your balance or moving  your arms or legs. Summary  After the procedure, it is common to have pain, soreness, bruising, and tiredness.  Your doctor will tell you how to take care of yourself at home. Change your bandage, take your medicines, and limit your activities as told by your doctor.  Call your doctor if you have symptoms of infection. Get help right away if your belly swells, your cut bleeds a lot, or you have trouble talking or breathing. This information is not intended to replace advice given to you by your health care provider. Make sure you discuss any questions you have with your health care provider. Document Revised: 11/22/2017 Document Reviewed: 11/23/2017 Elsevier Patient Education  2021 Elsevier Inc.   Moderate Conscious Sedation, Adult, Care After This sheet gives you information about how to care for yourself after your procedure. Your health care provider may also give you more specific instructions. If you have problems or questions, contact your health care provider. What can I expect after the procedure? After the procedure, it is common to have:  Sleepiness for several hours.  Impaired judgment for several hours.  Difficulty with balance.  Vomiting if you eat too soon. Follow these instructions at home: For the time period you were told by your health care provider:  Rest.  Do not participate in activities where you could fall or become injured.  Do not drive or use machinery.  Do not drink alcohol.  Do not take sleeping pills or medicines that cause drowsiness.  Do not make important decisions or sign legal documents.  Do not take care of children on your own.      Eating and drinking  Follow the diet recommended by your health care provider.  Drink enough fluid to keep your urine pale yellow.  If you vomit: ? Drink water, juice, or soup when you can drink without vomiting. ? Make sure you have little or no nausea before eating solid foods.   General  instructions  Take over-the-counter and prescription medicines only as told by your health care provider.  Have a responsible adult stay with you for the time you are told. It is important to have someone help care for you until you are awake and alert.  Do not smoke.  Keep all follow-up visits as told by your health care provider. This is important. Contact a health care provider if:  You are still sleepy or having trouble with balance after 24 hours.  You feel light-headed.  You keep feeling nauseous or you keep vomiting.  You develop a rash.  You have a fever.  You have redness or swelling around the IV site. Get help right away if:  You have trouble breathing.  You have new-onset confusion at home. Summary  After the procedure, it is   common to feel sleepy, have impaired judgment, or feel nauseous if you eat too soon.  Rest after you get home. Know the things you should not do after the procedure.  Follow the diet recommended by your health care provider and drink enough fluid to keep your urine pale yellow.  Get help right away if you have trouble breathing or new-onset confusion at home. This information is not intended to replace advice given to you by your health care provider. Make sure you discuss any questions you have with your health care provider. Document Revised: 03/12/2020 Document Reviewed: 10/09/2019 Elsevier Patient Education  2021 Elsevier Inc.  

## 2021-01-04 NOTE — Procedures (Signed)
Interventional Radiology Procedure Note  Procedure: [place] gudied core biopsy of the liver  Complications: None  Estimated Blood Loss: None  Recommendations: - Bedrest x 2 hrs - DC home  Signed,  Criselda Peaches, MD

## 2021-01-06 LAB — SURGICAL PATHOLOGY

## 2021-01-07 LAB — ACID FAST SMEAR (AFB, MYCOBACTERIA): Acid Fast Smear: NEGATIVE

## 2021-01-21 ENCOUNTER — Other Ambulatory Visit: Payer: Self-pay | Admitting: Internal Medicine

## 2021-01-21 MED ORDER — VALSARTAN-HYDROCHLOROTHIAZIDE 320-25 MG PO TABS: 1.0000 | ORAL_TABLET | Freq: Every day | ORAL | 1 refills | Status: DC

## 2021-02-08 LAB — FUNGUS CULTURE RESULT

## 2021-02-08 LAB — FUNGUS CULTURE WITH STAIN

## 2021-02-08 LAB — FUNGAL ORGANISM REFLEX

## 2021-02-09 ENCOUNTER — Ambulatory Visit (INDEPENDENT_AMBULATORY_CARE_PROVIDER_SITE_OTHER)
Admission: RE | Admit: 2021-02-09 | Discharge: 2021-02-09 | Disposition: A | Discharge status: Home / Self Care | Payer: 59 | Source: Ambulatory Visit | Attending: Internal Medicine | Admitting: Internal Medicine

## 2021-02-09 ENCOUNTER — Ambulatory Visit: Payer: 59 | Admitting: Internal Medicine

## 2021-02-09 ENCOUNTER — Other Ambulatory Visit: Payer: Self-pay

## 2021-02-09 ENCOUNTER — Encounter: Payer: Self-pay | Admitting: Internal Medicine

## 2021-02-09 ENCOUNTER — Other Ambulatory Visit (INDEPENDENT_AMBULATORY_CARE_PROVIDER_SITE_OTHER): Payer: 59

## 2021-02-09 VITALS — BP 110/80 | HR 76 | Ht 72.0 in | Wt 214.0 lb

## 2021-02-09 DIAGNOSIS — K753 Granulomatous hepatitis, not elsewhere classified: Secondary | ICD-10-CM | POA: Diagnosis not present

## 2021-02-09 DIAGNOSIS — K760 Fatty (change of) liver, not elsewhere classified: Secondary | ICD-10-CM | POA: Diagnosis not present

## 2021-02-09 DIAGNOSIS — R918 Other nonspecific abnormal finding of lung field: Secondary | ICD-10-CM | POA: Diagnosis not present

## 2021-02-09 LAB — COMPREHENSIVE METABOLIC PANEL
ALT: 57 U/L — ABNORMAL HIGH (ref 0–53)
AST: 39 U/L — ABNORMAL HIGH (ref 0–37)
Albumin: 4.4 g/dL (ref 3.5–5.2)
Alkaline Phosphatase: 43 U/L (ref 39–117)
BUN: 16 mg/dL (ref 6–23)
CO2: 30 mEq/L (ref 19–32)
Calcium: 9.8 mg/dL (ref 8.4–10.5)
Chloride: 104 mEq/L (ref 96–112)
Creatinine, Ser: 1.12 mg/dL (ref 0.40–1.50)
GFR: 79.4 mL/min (ref >60.00)
Glucose, Bld: 95 mg/dL (ref 70–99)
Potassium: 3.8 mEq/L (ref 3.5–5.1)
Sodium: 143 mEq/L (ref 135–145)
Total Bilirubin: 0.6 mg/dL (ref 0.2–1.2)
Total Protein: 7.9 g/dL (ref 6.0–8.3)

## 2021-02-09 NOTE — Patient Instructions (Signed)
Your provider has requested that you go to the basement level for lab work before leaving today. Press "B" on the elevator. The lab is located at the first door on the left as you exit the elevator.  Please go to the x-ray department in the basement before leaving today. Dr Carlean Purl said pending those results we may refer you to [facility].  Due to recent changes in healthcare laws, you may see the results of your imaging and laboratory studies on MyChart before your provider has had a chance to review them.  We understand that in some cases there may be results that are confusing or concerning to you. Not all laboratory results come back in the same time frame and the provider may be waiting for multiple results in order to interpret others.  Please give [place] 48 hours in order for your provider to thoroughly review all the results before contacting the office for clarification of your results.   Normal BMI (Body Mass Index- based on height and weight) is between 19 and 25. Your BMI today is Body mass index is 29.02 kg/m. Marland Kitchen Please consider follow up  regarding your BMI with your Primary Care Provider.   I appreciate the opportunity to care for you. Silvano Rusk, MD, [facility]

## 2021-02-09 NOTE — Progress Notes (Signed)
Christopher [name] 45 y.o. Apr 01, 1976 [id number] Referred by: [name], MD  Assessment & Plan:   Encounter Diagnoses  Name Primary?  . 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 Hepatic steatosis   . Pulmonary nodules-CT coronary study 08/16/2020     The most likely etiology is sarcoidosis regarding the granulomatous hepatitis.  He also has concomitant hepatic steatosis.  Both may be contributing to abnormal LFTs though the inflammation seems to be driven mostly from the granulomatous changes.  Keep in mind hepatic steatosis could be from alcohol as opposed to nonalcoholic version.  I have recommended he stop drinking at this time though I cannot say that alcohol is the culprit here it certainly is not in his best interest right now.  Lab evaluation today.   refer to pulmonary to see if they can add anything regarding sarcoidosis work-up.  No treatment required at this time but need to consider treating which could include steroids versus Biologics.  May need hepatology referral.  Sounds like he needs a CT of the chest as well in addition to the pulmonary consultation given the multiple pulmonary nodules on previous cardiac CT.  I did not mention that to the patient when he was here as I did not appreciate that on my chart review when the patient was in the office.  Orders Placed This Encounter  Procedures  . DG Chest 2 View  . Angiotensin converting enzyme  . Comp Met (CMET)      Subjective:   Chief Complaint: Granulomatous hepatitis  HPI The patient is a 45 year old white man remotely known to me from a visit in 2015, with negative colonoscopy then he had pancreatitis likely from alcohol and greatly reduced his alcohol intake after that.  More recently in January he had some abdominal pain and diarrhea and went to the emergency department and had a CT scan that demonstrated the following:  November 27, 2020-mild pericolonic soft tissue stranding next the  proximal colon suspicious for epiploic appendagitis  Small low-attenuation splenic lesions and small numerous subtle low-attenuation lesions throughout the liver  MRI was done: Diffuse hepatic steatosis diffuse miliary pattern of disease throughout the liver and possible early fibrotic changes similar but fewer splenic lesions also seen  Differential diagnosis includes infection sarcoidosis granulomatous disease less likely metastatic disease this procedure 12/23/2020  Reviewed images myself.  He had a CT coronary scan in the fall of last year and no comment and I do not see any major adenopathy in the hilum.  However there were multiple small pulmonary nodules scattered throughout the lungs.  Nonspecific but statistically likely benign. Liver biopsy was appropriately ordered by Dr. [name].  01/04/2021-numerous well-formed nonnecrotizing granulomas consistent with granulomatous hepatitis mild steatosis no evidence of advanced fibrosis or cirrhosis.  Acid-fast and Gram stains negative for pathogenic organisms differential includedInfection and drug effect but sarcoidosis most likely  He feels well at this time.  Fungal culture acid-fast culture is negative complete or pending so far.  He does drink is not abstinent does not drink every day but when he tends to drink he may have several mixed drinks or alcoholic beverages.  INR normal.  Transaminases over the last 2 months ranging from AST just barely above normal to ALT about 1.5-1.75 abnormal.  Normal alkaline phosphatase and bilirubin.  Platelets normal.  Hepatitis C antibody hepatitis B surface antigen B surface antibody all negative. Allergies  Allergen Reactions  . Morphine And Related Nausea And Vomiting and Other (See Comments)  Sweats   Current Meds  Medication Sig  . amLODipine (NORVASC) 10 MG tablet TAKE 1 TABLET BY MOUTH  DAILY  . clidinium-chlordiazePOXIDE (LIBRAX) 5-2.5 MG capsule Take 1 capsule by mouth daily.  . fenofibrate  (TRICOR) 145 MG tablet TAKE 1 TABLET BY MOUTH  DAILY  . Multiple Vitamin (MULTIVITAMIN WITH MINERALS) TABS tablet Take 1 tablet by mouth daily.  . pantoprazole (PROTONIX) 40 MG tablet TAKE 1 TABLET BY MOUTH  DAILY  . valsartan-hydrochlorothiazide (DIOVAN-HCT) 320-25 MG tablet Take 1 tablet by mouth daily.   Past Medical History:  Diagnosis Date  . GERD (gastroesophageal reflux disease)   . H/O hiatal hernia   . Hepatic steatosis   . Hyperlipidemia   . Hypertension   . IBS (irritable bowel syndrome)   . Kidney cyst, acquired   . Migraine    "probably monthly" (07/22/2014)  . 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 Pain in lower limb 03/02/2014  . Pancreatitis    Past Surgical History:  Procedure Laterality Date  . CARPAL TUNNEL RELEASE Right 2008  . COLONOSCOPY     IBS  . CYST EXCISION Right 2015   "2nd toe"  . ESOPHAGOGASTRODUODENOSCOPY    . KNEE ARTHROSCOPY Right 1992  . LASIK    . REFRACTIVE SURGERY Bilateral 2013  . SHOULDER SURGERY  10/2016  . UPPER GI ENDOSCOPY     chest pain  . 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   Review of Systems As per HPI.  Some back pain and fatigue.  All other review of systems negative.  Objective:   Physical Exam _0  110/80    Pulse 76   Ht 6' (1.829 m)   Wt 214 lb (97.1 kg)   BMI 29.02 kg/m @  General:  Well-developed, well-nourished and in no acute distress Eyes:  anicteric. Lungs: Clear to auscultation bilaterally. Heart:  S1S2, no rubs, murmurs, gallops. Abdomen:  soft, non-tender, no hepatosplenomegaly, hernia, or mass and BS+.  Lymph:  no cervical or supraclavicular adenopathy. Extremities:   no edema, cyanosis or clubbing Skin   no rash.  Does have multiple minor traumatic injuries 1 on the right deltoid where he struck something and he has some scratches on the arms Neuro:  A&O x 3.  Psych:  appropriate mood and  Affect.   Data Reviewed: See HPI Emergency department notes primary care notes from 2022 are reviewed.

## 2021-02-11 LAB — ANGIOTENSIN CONVERTING ENZYME: Angiotensin-Converting Enzyme: 35 U/L (ref 9–67)

## 2021-02-14 NOTE — Progress Notes (Signed)
Liver tests slightly abnormal  The ACE test was ok  See chest xray but will send to pulmonary.  You will need a follow-up with me in late May or June.  Sheri my RN will set this up also  Gatha Mayer, MD, [facility]

## 2021-02-14 NOTE — Progress Notes (Signed)
Christopher [name],  The xray looks ok. The blood test I checked was normal. Based upon that I would have thought we do not need you to see lung specialist but in going over xrays in past I saw that the cardiac CT from last year showed small lung nodules. A Ct is more sensitive than a chext xray. It could be that the lung nodules are gone but I think we should send you to pulmonary specialists.  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   Barbera Setters will let you know next steps.  Let me know if any ?'s  Gatha Mayer, MD, [facility]

## 2021-02-15 ENCOUNTER — Other Ambulatory Visit: Payer: Self-pay

## 2021-02-15 DIAGNOSIS — R911 Solitary pulmonary nodule: Secondary | ICD-10-CM

## 2021-02-15 DIAGNOSIS — D8689 Sarcoidosis of other sites: Secondary | ICD-10-CM

## 2021-02-18 ENCOUNTER — Other Ambulatory Visit: Payer: Self-pay | Admitting: Internal Medicine

## 2021-02-19 LAB — ACID FAST CULTURE WITH REFLEXED SENSITIVITIES (MYCOBACTERIA): Acid Fast Culture: NEGATIVE

## 2021-04-17 ENCOUNTER — Other Ambulatory Visit: Payer: Self-pay | Admitting: Internal Medicine

## 2021-04-21 ENCOUNTER — Ambulatory Visit: Payer: 59 | Admitting: Internal Medicine

## 2021-06-06 ENCOUNTER — Other Ambulatory Visit: Payer: Self-pay

## 2021-06-06 NOTE — Patient Instructions (Addendum)
  Blood work was ordered.     Medications changes include :   none  Your prescription(s) have been submitted to your pharmacy. Please take as directed and contact our office if you believe you are having problem(s) with the medication(s).   A referral was ordered for Pulmonary.       Someone from their office will call you to schedule an appointment.    Please followup in 6 months

## 2021-06-06 NOTE — Progress Notes (Signed)
Subjective:    Patient ID: Christopher [name], male    DOB: 1976-08-30, 45 y.o.   MRN: [id number]  HPI The patient is here for follow up of their chronic medical problems, including htn, hypertriglyceridemia, fatty liver, hyperglycemia, GERD, liver and spleen lesions - granulomatous dz - likely sarcoidosis  Riding bike, softball.   Feels good - no symptoms.   Medications and allergies reviewed with patient and updated if appropriate.  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   Pulmonary nodules-CT coronary study 08/16/2020 02/09/2021   Elevated LFTs 12/17/2020   Lesion of liver 12/17/2020   Testosterone deficiency 12/17/2020   Eustachian tube dysfunction, bilateral 12/17/2020   COVID-19 12/02/2020   Memory changes 05/28/2019   Skin sensation disturbance 05/28/2019   Hyperglycemia 05/27/2019   History of gout 08/23/2017   Erectile dysfunction 08/23/2017   History of pancreatitis 02/16/2017   Renal mass, right 07/31/2014   Alcohol abuse 07/22/2014   Hepatic steatosis 07/22/2014   Equinus deformity of foot, acquired 03/02/2014   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     valsartan-hydrochlorothiazide (DIOVAN-HCT) 320-25 MG tablet Take 1 tablet by mouth daily. 90 tablet 1   No current facility-administered medications on file prior to visit.    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   Pain in lower limb 03/02/2014   Pancreatitis     Past Surgical History:  Procedure Laterality Date   CARPAL TUNNEL RELEASE Right 2008   COLONOSCOPY     IBS   CYST EXCISION Right 2015   "2nd toe"   ESOPHAGOGASTRODUODENOSCOPY     KNEE ARTHROSCOPY Right 1992   LASIK     REFRACTIVE SURGERY Bilateral 2013   SHOULDER SURGERY  10/2016   UPPER GI ENDOSCOPY     chest pain   WRIST FRACTURE SURGERY Right 2008   "got plate and screws"    Social History   Socioeconomic History   Marital status: Married    Spouse name: Not on file   Number of children: 2   Years of education: Not on file   Highest education level: Not on file  Occupational History   Occupation: ENGINEERING SPEALIST    Employer: [employer]  Tobacco Use   Smoking status: Never   Smokeless tobacco: Never  Substance and Sexual Activity   Alcohol use: Yes    Comment: 5-6 drinks once a week   Drug use: No   Sexual activity: Yes  Other Topics Concern   Not on file  Social History Narrative   Married, second wife, daughter born  in 1998, son born in 2000 separated as of 2022   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[place]   3 caffeinated beverages daily   Alcohol several at a time but not every day maybe once or twice a week never smoker no other tobacco no drug use   Social Determinants of Radio broadcast assistant Strain: Not on file  Food Insecurity: Not on file  Transportation Needs: Not on file  Physical Activity: Not on file  Stress: Not on file  Social Connections: Not on file    Family History  Problem Relation Age of Onset   Hypertension Mother    Hyperlipidemia Mother    Depression Mother    Hypertension Father    Alzheimer's  disease Maternal Grandmother    Alcohol abuse Maternal Grandfather    Heart disease Maternal Grandfather    Heart failure Maternal Grandfather    Alzheimer's disease Paternal Grandmother    Alcohol abuse Paternal Grandfather    Depression Sister    Heart disease Maternal Uncle    Colon cancer Neg Hx     Review of Systems  Constitutional:  Negative for chills and fever.  Respiratory:  Negative for cough, shortness of breath and wheezing.   Cardiovascular:  Positive for palpitations (occ, transient). Negative for chest pain and leg swelling.  Gastrointestinal:  Negative for abdominal pain, blood in stool, constipation, diarrhea and nausea.       Occ gerd  Neurological:  Negative for light-headedness, numbness and headaches.      Objective:   Vitals:   06/07/21 0812  BP: 132/84  Pulse: 71  Temp: 98.3 F (36.8 C)  SpO2: 99%   BP Readings from Last 3 Encounters:  06/07/21 132/84  02/09/21 110/80  01/04/21 132/81   Wt Readings from Last 3 Encounters:  06/07/21 217 lb (98.4 kg)  02/09/21 214 lb (97.1 kg)  12/17/20 216 lb 3.2 oz (98.1 kg)   Body mass index is 29.43 kg/m.   Physical Exam    Constitutional: Appears well-developed and well-nourished. No distress.  HENT:  Head: Normocephalic and atraumatic.  Neck: Neck supple. No tracheal deviation present. No thyromegaly present.  No cervical lymphadenopathy Cardiovascular: Normal rate, regular rhythm and normal heart sounds.   No murmur heard. No carotid bruit .  No edema Pulmonary/Chest: Effort normal and breath sounds normal. No respiratory distress. No has no wheezes. No rales.  Skin: Skin is warm and dry. Not diaphoretic.  Psychiatric: Normal mood and affect. Behavior is normal.      Assessment & Plan:    See Problem List for Assessment and Plan of chronic medical problems.    This visit occurred during the SARS-CoV-2 public health emergency.  Safety protocols were in place, including screening questions  prior to the visit, additional usage of staff PPE, and extensive cleaning of exam room while observing appropriate contact time as indicated for disinfecting solutions.

## 2021-06-07 ENCOUNTER — Ambulatory Visit: Payer: 59 | Admitting: Internal Medicine

## 2021-06-07 ENCOUNTER — Encounter: Payer: Self-pay | Admitting: Internal Medicine

## 2021-06-07 VITALS — BP 132/84 | HR 71 | Temp 98.3°F | Ht 72.0 in | Wt 217.0 lb

## 2021-06-07 DIAGNOSIS — I10 Essential (primary) hypertension: Secondary | ICD-10-CM | POA: Diagnosis not present

## 2021-06-07 DIAGNOSIS — R739 Hyperglycemia, unspecified: Secondary | ICD-10-CM | POA: Diagnosis not present

## 2021-06-07 DIAGNOSIS — K219 Gastro-esophageal reflux disease without esophagitis: Secondary | ICD-10-CM

## 2021-06-07 DIAGNOSIS — R252 Cramp and spasm: Secondary | ICD-10-CM | POA: Diagnosis not present

## 2021-06-07 DIAGNOSIS — E781 Pure hyperglyceridemia: Secondary | ICD-10-CM | POA: Diagnosis not present

## 2021-06-07 DIAGNOSIS — K753 Granulomatous hepatitis, not elsewhere classified: Secondary | ICD-10-CM

## 2021-06-07 LAB — CBC WITH DIFFERENTIAL/PLATELET
Basophils Absolute: 0.1 10*3/uL (ref 0.0–0.1)
Basophils Relative: 0.9 % (ref 0.0–3.0)
Eosinophils Absolute: 0.4 10*3/uL (ref 0.0–0.7)
Eosinophils Relative: 6.4 % — ABNORMAL HIGH (ref 0.0–5.0)
HCT: 34.4 % — ABNORMAL LOW (ref 39.0–52.0)
Hemoglobin: 11.7 g/dL — ABNORMAL LOW (ref 13.0–17.0)
Lymphocytes Relative: 21 % (ref 12.0–46.0)
Lymphs Abs: 1.2 10*3/uL (ref 0.7–4.0)
MCHC: 34 g/dL (ref 30.0–36.0)
MCV: 87.3 fl (ref 78.0–100.0)
Monocytes Absolute: 0.6 10*3/uL (ref 0.1–1.0)
Monocytes Relative: 11.1 % (ref 3.0–12.0)
Neutro Abs: 3.3 10*3/uL (ref 1.4–7.7)
Neutrophils Relative %: 60.6 % (ref 43.0–77.0)
Platelets: 366 10*3/uL (ref 150.0–400.0)
RBC: 3.95 Mil/uL — ABNORMAL LOW (ref 4.22–5.81)
RDW: 13.6 % (ref 11.5–15.5)
WBC: 5.5 10*3/uL (ref 4.0–10.5)

## 2021-06-07 LAB — COMPREHENSIVE METABOLIC PANEL
ALT: 39 U/L (ref 0–53)
AST: 27 U/L (ref 0–37)
Albumin: 4.3 g/dL (ref 3.5–5.2)
Alkaline Phosphatase: 45 U/L (ref 39–117)
BUN: 14 mg/dL (ref 6–23)
CO2: 28 mEq/L (ref 19–32)
Calcium: 9 mg/dL (ref 8.4–10.5)
Chloride: 103 mEq/L (ref 96–112)
Creatinine, Ser: 1.14 mg/dL (ref 0.40–1.50)
GFR: 77.55 mL/min (ref >60.00)
Glucose, Bld: 87 mg/dL (ref 70–99)
Potassium: 3.9 mEq/L (ref 3.5–5.1)
Sodium: 140 mEq/L (ref 135–145)
Total Bilirubin: 0.6 mg/dL (ref 0.2–1.2)
Total Protein: 7.1 g/dL (ref 6.0–8.3)

## 2021-06-07 LAB — LIPID PANEL
Cholesterol: 173 mg/dL (ref 0–200)
HDL: 44.9 mg/dL (ref >39.00)
LDL Cholesterol: 102 mg/dL — ABNORMAL HIGH (ref 0–99)
NonHDL: 127.85
Total CHOL/HDL Ratio: 4
Triglycerides: 129 mg/dL (ref 0.0–149.0)
VLDL: 25.8 mg/dL (ref 0.0–40.0)

## 2021-06-07 LAB — MAGNESIUM: Magnesium: 1.9 mg/dL (ref 1.5–2.5)

## 2021-06-07 LAB — HEMOGLOBIN A1C: Hgb A1c MFr Bld: 5.4 % (ref 4.6–6.5)

## 2021-06-07 MED ORDER — PANTOPRAZOLE SODIUM 40 MG PO TBEC: 40.0000 mg | DELAYED_RELEASE_TABLET | Freq: Every day | ORAL | 3 refills | Status: DC

## 2021-06-07 MED ORDER — CILIDINIUM-CHLORDIAZEPOXIDE 2.5-5 MG PO CAPS: 1.0000 | ORAL_CAPSULE | Freq: Every day | ORAL | 1 refills | Status: DC

## 2021-06-07 NOTE — Assessment & Plan Note (Signed)
Chronic Check lipid panel  Continue tricor 145 mg qd Regular exercise and healthy diet encouraged

## 2021-06-07 NOTE — Assessment & Plan Note (Signed)
GI referred him to [facility], but he deferred appt - asymptomatic ? Need treatment / observ  Agrees to see Pulm for their advice  - referred today

## 2021-06-07 NOTE — Assessment & Plan Note (Signed)
Chronic GERD controlled Continue protonix 40 mg daily  

## 2021-06-07 NOTE — Assessment & Plan Note (Signed)
Chronic Check a1c Low sugar / carb diet Stressed regular exercise  

## 2021-06-07 NOTE — Assessment & Plan Note (Signed)
Chronic BP well controlled Continue diovan-hct 320-25 mg qd, amlodipine 10 mg qd cmp

## 2021-06-07 NOTE — Assessment & Plan Note (Signed)
Acute Nocturnal ? Dehydration or low electrolytes Check cmp, mg Increase hydration Can try electrolyte supplementation

## 2021-08-15 ENCOUNTER — Other Ambulatory Visit: Payer: Self-pay

## 2021-08-15 ENCOUNTER — Emergency Department (HOSPITAL_COMMUNITY)
Admission: EM | Admit: 2021-08-15 | Discharge: 2021-08-15 | Disposition: A | Discharge status: Home / Self Care | Payer: 59 | Attending: Emergency Medicine | Admitting: Emergency Medicine

## 2021-08-15 ENCOUNTER — Telehealth: Payer: Self-pay | Admitting: Internal Medicine

## 2021-08-15 ENCOUNTER — Emergency Department (HOSPITAL_COMMUNITY): Payer: 59

## 2021-08-15 ENCOUNTER — Encounter (HOSPITAL_COMMUNITY): Payer: Self-pay | Admitting: Emergency Medicine

## 2021-08-15 DIAGNOSIS — R0789 Other chest pain: Secondary | ICD-10-CM | POA: Diagnosis not present

## 2021-08-15 DIAGNOSIS — I10 Essential (primary) hypertension: Secondary | ICD-10-CM | POA: Insufficient documentation

## 2021-08-15 DIAGNOSIS — R030 Elevated blood-pressure reading, without diagnosis of hypertension: Secondary | ICD-10-CM

## 2021-08-15 DIAGNOSIS — Z79899 Other long term (current) drug therapy: Secondary | ICD-10-CM | POA: Insufficient documentation

## 2021-08-15 DIAGNOSIS — Z8616 Personal history of COVID-19: Secondary | ICD-10-CM | POA: Diagnosis not present

## 2021-08-15 LAB — CBC
HCT: 38.9 % — ABNORMAL LOW (ref 39.0–52.0)
Hemoglobin: 12.2 g/dL — ABNORMAL LOW (ref 13.0–17.0)
MCH: 26.8 pg (ref 26.0–34.0)
MCHC: 31.4 g/dL (ref 30.0–36.0)
MCV: 85.5 fL (ref 80.0–100.0)
Platelets: 377 10*3/uL (ref 150–400)
RBC: 4.55 MIL/uL (ref 4.22–5.81)
RDW: 13.5 % (ref 11.5–15.5)
WBC: 5.1 10*3/uL (ref 4.0–10.5)
nRBC: 0 % (ref 0.0–0.2)

## 2021-08-15 LAB — BASIC METABOLIC PANEL
Anion gap: 8 (ref 5–15)
BUN: 10 mg/dL (ref 6–20)
CO2: 27 mmol/L (ref 22–32)
Calcium: 8.9 mg/dL (ref 8.9–10.3)
Chloride: 104 mmol/L (ref 98–111)
Creatinine, Ser: 1.09 mg/dL (ref 0.61–1.24)
GFR, Estimated: >60 mL/min (ref >60)
Glucose, Bld: 96 mg/dL (ref 70–99)
Potassium: 3.7 mmol/L (ref 3.5–5.1)
Sodium: 139 mmol/L (ref 135–145)

## 2021-08-15 LAB — TROPONIN I (HIGH SENSITIVITY)
Troponin I (High Sensitivity): 6 ng/L (ref <18)
Troponin I (High Sensitivity): 6 ng/L (ref <18)

## 2021-08-15 LAB — HEPATIC FUNCTION PANEL
ALT: 45 U/L — ABNORMAL HIGH (ref 0–44)
AST: 36 U/L (ref 15–41)
Albumin: 3.8 g/dL (ref 3.5–5.0)
Alkaline Phosphatase: 44 U/L (ref 38–126)
Bilirubin, Direct: 0.1 mg/dL (ref 0.0–0.2)
Indirect Bilirubin: 0.5 mg/dL (ref 0.3–0.9)
Total Bilirubin: 0.6 mg/dL (ref 0.3–1.2)
Total Protein: 7.2 g/dL (ref 6.5–8.1)

## 2021-08-15 LAB — LIPASE, BLOOD: Lipase: 53 U/L — ABNORMAL HIGH (ref 11–51)

## 2021-08-15 MED ORDER — IBUPROFEN 400 MG PO TABS
600.0000 mg | ORAL_TABLET | Freq: Once | ORAL | Status: AC
  Administered 2021-08-15: 600 mg via ORAL
  Filled 2021-08-15: qty 1

## 2021-08-15 MED ORDER — METHOCARBAMOL 500 MG PO TABS
1000.0000 mg | ORAL_TABLET | Freq: Once | ORAL | Status: AC
  Administered 2021-08-15: 1000 mg via ORAL
  Filled 2021-08-15: qty 2

## 2021-08-15 NOTE — ED Triage Notes (Signed)
Pt here from home with c/o cp over the last few days with some slight sob , hx of htn and family hx of cardiac problems

## 2021-08-15 NOTE — ED Provider Notes (Signed)
Emergency Medicine Provider Triage Evaluation Note  Christopher [name] , a 45 y.o. male  was evaluated in triage.  Pt complains of Chest pain.  He reports intermittent chest pain.  He has not been taking his blood pressure medication today.  Review of Systems  Positive: Chest pain Negative: fevers  Physical Exam  There were no vitals taken for this visit. Gen:   Awake, no distress   Resp:  Normal effort  MSK:   Moves extremities without difficulty  Other:  Patient with normal gait.   Medical Decision Making  Medically screening exam initiated at 12:16 PM.  Appropriate orders placed.  Christopher [name] was informed that the remainder of the evaluation will be completed by another provider, this initial triage assessment does not replace that evaluation, and the importance of remaining in the ED until their evaluation is complete.  Note: Portions of this report may have been transcribed using voice recognition software. Every effort was made to ensure accuracy; however, inadvertent computerized transcription errors may be present    Lorin Glass, PA-C 08/15/21 [place], [place] A, DO 08/15/21 1628

## 2021-08-15 NOTE — ED Provider Notes (Signed)
[facility]ERGENC[facility] DEPARTMENT Provider Note   CSN: [id number] Arrival date & time: 08/15/21  1213     History No chief complaint on file.   Christopher [name] is a 45 y.o. male.  45 yo male with history of HTN, HLD presents to ED 2/2 chest pain x2-3 days. Pain is intermittent, localized to left side of chest wall.  Pain described as tightness.  Nonradiating.  Not a/w dyspnea, nausea, emesis, diaphoresis, exertion. No cough, congestion, change to bowel or bladder function. Occurs without perceived provoking factor, lasts 3-4 seconds and resolves spontaneously. He has not had this sensation in the past.  No recent travel or sick contacts, no history of DVT or PE.  No lower extremity swelling. Currently asymptomatic. Last time he experienced this pain was around 30-45 minutes ago while in the ED. Does report increased stress a/w work over the last week or so.   The history is provided by the patient. No language interpreter was used.  Chest Pain Pain location:  L chest Pain quality: aching   Pain radiates to:  Does not radiate Onset quality:  Sudden Duration:  3 days Timing:  Intermittent Progression:  Waxing and waning Associated symptoms: no abdominal pain, no cough, no dysphagia, no fever, no headache, no nausea, no palpitations, no shortness of breath and no vomiting       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   Pain in lower limb 03/02/2014   Pancreatitis     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   Pulmonary nodules-CT coronary study 08/16/2020 02/09/2021   Elevated LFTs 12/17/2020   Lesion of liver 12/17/2020    Testosterone deficiency 12/17/2020   COVID-19 12/02/2020   Memory changes 05/28/2019   Skin sensation disturbance 05/28/2019   Hyperglycemia 05/27/2019   History of gout 08/23/2017   Erectile dysfunction 08/23/2017   History of pancreatitis 02/16/2017   Renal mass, right 07/31/2014   Alcohol abuse 07/22/2014   Hepatic steatosis 07/22/2014   Equinus deformity of foot, acquired 03/02/2014   Parotid mass 05/05/2013   ALLERGIC RHINITIS DUE TO POLLEN 02/14/2010   Essential hypertension 03/09/2008   Hypertriglyceridemia 08/16/2007   GERD 08/16/2007   IBS (irritable bowel syndrome) 08/15/2007    Past Surgical History:  Procedure Laterality Date   CARPAL TUNNEL RELEASE Right 2008   COLONOSCOPY     IBS   CYST EXCISION Right 2015   "2nd toe"   ESOPHAGOGASTRODUODENOSCOPY     KNEE ARTHROSCOPY Right 1992   LASIK     REFRACTIVE SURGERY Bilateral 2013   SHOULDER SURGERY  10/2016   UPPER GI ENDOSCOPY     chest pain   WRIST FRACTURE SURGERY Right 2008   "got plate and screws"       Family History  Problem Relation Age of Onset   Hypertension Mother    Hyperlipidemia Mother    Depression Mother    Hypertension Father    Alzheimer's disease Maternal Grandmother    Alcohol abuse Maternal Grandfather    Heart disease Maternal Grandfather    Heart failure Maternal Grandfather  Alzheimer's disease Paternal Grandmother    Alcohol abuse Paternal Grandfather    Depression Sister    Heart disease Maternal Uncle    Colon cancer Neg Hx     Social History   Tobacco Use   Smoking status: Never   Smokeless tobacco: Never  Substance Use Topics   Alcohol use: Yes    Comment: 5-6 drinks once a week   Drug use: No    Home Medications Prior to Admission medications   Medication Sig Start Date End Date Taking? Authorizing Provider  amLODipine (NORVASC) 10 MG tablet TAKE 1 TABLET BY MOUTH  DAILY 04/18/21   [name], MD  clidinium-chlordiazePOXIDE (LIBRAX) 5-2.5 MG capsule  Take 1 capsule by mouth daily. 06/07/21   [name], MD  fenofibrate (TRICOR) 145 MG tablet TAKE 1 TABLET BY MOUTH  DAILY 09/07/20   [name], MD  Multiple Vitamin (MULTIVITAMIN WITH MINERALS) TABS tablet Take 1 tablet by mouth daily.    [provider]  pantoprazole (PROTONIX) 40 MG tablet Take 1 tablet (40 mg total) by mouth daily. 06/07/21   [name], MD  valsartan-hydrochlorothiazide (DIOVAN-HCT) 320-25 MG tablet Take 1 tablet by mouth daily. 01/21/21   [name], MD    Allergies    Morphine and related  Review of Systems   Review of Systems  Constitutional:  Negative for chills and fever.  HENT:  Negative for facial swelling and trouble swallowing.   Eyes:  Negative for photophobia and visual disturbance.  Respiratory:  Negative for cough and shortness of breath.   Cardiovascular:  Positive for chest pain. Negative for palpitations.  Gastrointestinal:  Negative for abdominal pain, nausea and vomiting.  Endocrine: Negative for polydipsia and polyuria.  Genitourinary:  Negative for difficulty urinating and hematuria.  Musculoskeletal:  Negative for gait problem and joint swelling.  Skin:  Negative for pallor and rash.  Neurological:  Negative for syncope and headaches.  Psychiatric/Behavioral:  Negative for agitation and confusion.    Physical Exam Updated Vital Signs BP (!) 172/100 (BP Location: Left Arm)   Pulse 65   Temp 98.1 F (36.7 C) (Oral)   Resp 16   SpO2 100%   Physical Exam Vitals and nursing note reviewed.  Constitutional:      General: He is not in acute distress.    Appearance: Normal appearance. He is well-developed.  HENT:     Head: Normocephalic and atraumatic.     Right Ear: External ear normal.     Left Ear: External ear normal.     Mouth/Throat:     Mouth: Mucous membranes are moist.  Eyes:     General: No scleral icterus. Cardiovascular:     Rate and Rhythm: Normal rate and regular rhythm.     Pulses: Normal pulses.      Heart sounds: Normal heart sounds.  Pulmonary:     Effort: Pulmonary effort is normal. No respiratory distress.     Breath sounds: Normal breath sounds.  Abdominal:     General: Abdomen is flat.     Palpations: Abdomen is soft.     Tenderness: There is no abdominal tenderness.  Musculoskeletal:        General: Normal range of motion.     Cervical back: Normal range of motion. No rigidity.     Right lower leg: No edema.     Left lower leg: No edema.  Skin:    General: Skin is warm and dry.  Capillary Refill: Capillary refill takes less than 2 seconds.  Neurological:     Mental Status: He is alert and oriented to person, place, and time.  Psychiatric:        Mood and Affect: Mood normal.        Behavior: Behavior normal.    ED Results / Procedures / Treatments   Labs (all labs ordered are listed, but only abnormal results are displayed) Labs Reviewed  CBC - Abnormal; Notable for the following components:      Result Value   Hemoglobin 12.2 (*)    HCT 38.9 (*)    All other components within normal limits  LIPASE, BLOOD - Abnormal; Notable for the following components:   Lipase 53 (*)    All other components within normal limits  HEPATIC FUNCTION PANEL - Abnormal; Notable for the following components:   ALT 45 (*)    All other components within normal limits  BASIC METABOLIC PANEL  TROPONIN I (HIGH SENSITIVITY)  TROPONIN I (HIGH SENSITIVITY)    EKG EKG Interpretation  Date/Time:  Monday August 15 2021 12:13:19 EDT Ventricular Rate:  65 PR Interval:  168 QRS Duration: 76 QT Interval:  390 QTC Calculation: 405 R Axis:   26 Text Interpretation: Normal sinus rhythm Possible Anterior infarct , age undetermined Abnormal ECG Similar to prior tracing Confirmed by Wynona Dove ([id number]) on 08/15/2021 3:28:38 PM  Radiology DG Chest 2 View  Result Date: 08/15/2021 CLINICAL DATA:  Chest pain over the last 3 days EXAM: CHEST - 2 VIEW COMPARISON:  02/09/2021 FINDINGS:  Heart size is normal. Mediastinal shadows are normal. The lungs are clear. No bronchial thickening. No infiltrate, mass, effusion or collapse. Pulmonary vascularity is normal. No bony abnormality. IMPRESSION: Normal chest Electronically Signed   By: Nelson Chimes M.D.   On: 08/15/2021 13:08    Procedures Procedures   Medications Ordered in ED Medications  methocarbamol (ROBAXIN) tablet 1,000 mg (has no administration in time range)  ibuprofen (ADVIL) tablet 600 mg (has no administration in time range)    ED Course  I have reviewed the triage vital signs and the nursing notes.  Pertinent labs & imaging results that were available during my care of the patient were reviewed by me and considered in my medical decision making (see chart for details).    MDM Rules/Calculators/A&P                           This patient complains of chest wall pain; this involves an extensive number of treatment options and is a complaint that carries with it a high risk of complications and morbidity. Vital signs reviewed and are stable. Serious etiologies considered.  Wells score is LOW, PERC rule applies. Heart score is LOW  Lipase is mildly elevated at 53, no pain at epigastric region, no n/v. Doubt acute pancreatitis. Advised pt to avoid etoh use.    The patient's chest pain is not suggestive of pulmonary embolus, cardiac ischemia, aortic dissection, pericarditis, myocarditis, pulmonary embolism, pneumothorax, pneumonia, Zoster, or esophageal perforation, or other serious etiology.  Historically not abrupt in onset, tearing or ripping, pulses symmetric. EKG nonspecific for ischemia/infarction. No dysrhythmias, brugada, WPW, prolonged QT noted. Troponin negative x2. CXR reviewed. Labs without demonstration of acute pathology unless otherwise noted above. Low HEART Score: 0-3 points (0.9-1.7% risk of MACE). Given the extremely low risk of these diagnoses further testing and evaluation for these possibilities  does not appear to be  indicated at this time. Patient in no distress and overall condition improved here in the ED. Detailed discussions were had with the patient regarding current findings, and need for close f/u with PCP or on call doctor. The patient has been instructed to return immediately if the symptoms worsen in any way for re-evaluation. Patient verbalized understanding and is in agreement with current care plan. All questions answered prior to discharge.    Final Clinical Impression(s) / ED Diagnoses Final diagnoses:  Atypical chest pain  Elevated blood pressure reading    Rx / DC Orders ED Discharge Orders     None        Jeanell Sparrow, DO 08/15/21 1627

## 2021-08-15 NOTE — Telephone Encounter (Signed)
[facility] FYI...   Caller states that he is having chest pain for a few days. Hurts to the left of center of his chest. Intermittent moderate pain. No difficulty breathing. Has had fatigue that is new for him.   Advised to go to ED now. Patient agreed and went to [facility].

## 2021-08-15 NOTE — Discharge Instructions (Addendum)
Please follow-up with your PCP regarding elevated blood pressure.   Return to the Emergency Department if you have unusual chest pain, pressure, or discomfort, shortness of breath, nausea, vomiting, burping, heartburn, tingling upper body parts, sweating, cold, clammy skin, or racing heartbeat. Call 911 if you think you are having a heart attack. Take all cardiac medications as prescribed - notify your doctor if you have any side effects. Follow cardiac diet - avoid fatty & fried foods, don't eat too much red meat, eat lots of fruits & vegetables, and dairy products should be low fat. Please lose weight if you are overweight. Become more active with walking, gardening, or any other activity that gets you to moving.   Please return to the emergency department immediately for any new or concerning symptoms, or if you get worse.

## 2021-08-21 DIAGNOSIS — R0789 Other chest pain: Secondary | ICD-10-CM | POA: Insufficient documentation

## 2021-08-21 NOTE — Progress Notes (Signed)
Subjective:    Patient ID: Christopher [name], male    DOB: [birth year], 45 y.o.   MRN: [id number]  This visit occurred during the SARS-CoV-2 public health emergency.  Safety protocols were in place, including screening questions prior to the visit, additional usage of staff PPE, and extensive cleaning of exam room while observing appropriate contact time as indicated for disinfecting solutions.     HPI The patient is here for follow up from the hospital  ED 9/19 for chest pain.  Had CP x 2-3 days, intermittent, left chest tightness, non-radiating.  Pain lasted 3-4 sec.  No SOB, nausea, diaphoresis, cough, LE edema.  No chest pain in ED.  Has had increased stress at work.  No prior episodes.     In ED BP was 172/100.  EKG w/o acute change..  Labs, trop x 2, cxr normal, except elevated lipase.  Received robaxin and advil.  Discharged to home dx - atypical CP, elevated BP.   Since the ED he did go on vacation and did not notice the pain.  He does state a lot of stress stress from work and he is currently separated from his wife so stress at home as well.  He did go on a long bike ride and did not have any chest pain.  He denies chest pain with exertion.  He does have difficulty sleeping and thinks it is related to stress..    Medications and allergies reviewed with patient and updated if appropriate.  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   Pulmonary nodules-CT coronary study 08/16/2020 02/09/2021   Elevated LFTs 12/17/2020   Lesion of liver 12/17/2020   Testosterone deficiency 12/17/2020   COVID-19 12/02/2020   Memory changes 05/28/2019   Skin sensation disturbance 05/28/2019   Hyperglycemia 05/27/2019   History of gout 08/23/2017   Erectile dysfunction 08/23/2017   History of pancreatitis 02/16/2017   Renal mass, right 07/31/2014   Alcohol abuse  07/22/2014   Hepatic steatosis 07/22/2014   Equinus deformity of foot, acquired 03/02/2014   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 90 capsule 1   fenofibrate (TRICOR) 145 MG tablet TAKE 1 TABLET BY MOUTH  DAILY 90 tablet 2   Multiple Vitamin (MULTIVITAMIN WITH MINERALS) TABS tablet Take 1 tablet by mouth daily.     pantoprazole (PROTONIX) 40 MG tablet Take 1 tablet (40 mg total) by mouth daily. 90 tablet 3   valsartan-hydrochlorothiazide (DIOVAN-HCT) 320-25 MG tablet Take 1 tablet by mouth daily. 90 tablet 1   No current facility-administered medications on file prior to visit.    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  Pain in lower limb 03/02/2014   Pancreatitis     Past Surgical History:  Procedure Laterality Date   CARPAL TUNNEL RELEASE Right 2008   COLONOSCOPY     IBS   CYST EXCISION Right 2015   "2nd toe"   ESOPHAGOGASTRODUODENOSCOPY     KNEE ARTHROSCOPY Right 1992   LASIK     REFRACTIVE SURGERY Bilateral 2013   SHOULDER SURGERY  10/2016   UPPER GI ENDOSCOPY     chest pain   WRIST FRACTURE SURGERY Right 2008   "got plate and screws"    Social History   Socioeconomic History   Marital status: Married    Spouse name: Not on file   Number of children: 2   Years of education: Not on file   Highest  education level: Not on file  Occupational History   Occupation: ENGINEERING SPEALIST    Employer: [employer]  Tobacco Use   Smoking status: Never   Smokeless tobacco: Never  Substance and Sexual Activity   Alcohol use: Yes    Comment: 5-6 drinks once a week   Drug use: No   Sexual activity: Yes  Other Topics Concern   Not on file  Social History Narrative   Married, second wife, daughter born in 33, son born in 2000 separated as of 2022   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[place]   3 caffeinated beverages daily   Alcohol several at a time but not every day maybe once or twice a week never smoker no other tobacco no drug use   Social Determinants of Radio broadcast assistant Strain: Not on file  Food Insecurity: Not on file  Transportation Needs: Not on file  Physical Activity: Not on file  Stress: Not on file  Social Connections: Not on file    Family History  Problem Relation Age of Onset   Hypertension Mother    Hyperlipidemia Mother    Depression Mother    Hypertension Father    Alzheimer's disease Maternal Grandmother    Alcohol abuse Maternal Grandfather    Heart disease Maternal Grandfather    Heart failure Maternal Grandfather    Alzheimer's disease Paternal Grandmother    Alcohol abuse Paternal Grandfather    Depression Sister    Heart disease Maternal Uncle    Colon cancer Neg Hx     Review of Systems  Constitutional:  Negative for appetite change and fever.  Respiratory:  Negative for cough, shortness of breath and wheezing.   Cardiovascular:  Positive for chest pain (improved) and palpitations (rare). Negative for leg swelling.  Gastrointestinal:  Negative for abdominal pain and nausea.       Occ gerd  Neurological:  Negative for dizziness, light-headedness and headaches.  Psychiatric/Behavioral:  Positive for sleep disturbance (trouble falling asleep and staying asleep). Negative for dysphoric mood.       Objective:    Vitals:   08/22/21 0755  BP: 118/80  Pulse: 66  Temp: 98.1 F (36.7 C)  SpO2: 99%   BP Readings from Last 3 Encounters:  08/22/21 118/80  08/15/21 140/88  06/07/21 132/84   Wt Readings from Last 3 Encounters:  08/22/21 223 lb (101.2 kg)  06/07/21 217 lb (98.4 kg)  02/09/21 214 lb (97.1 kg)   Body mass index is 30.24 kg/m.   Physical Exam    Constitutional: Appears well-developed and well-nourished. No distress.  HENT:  Head: Normocephalic and atraumatic.  Neck: Neck supple. No tracheal  deviation present. No thyromegaly present.  No cervical lymphadenopathy Cardiovascular: Normal rate, regular rhythm and normal heart sounds.   No murmur heard. No carotid bruit .  No edema Pulmonary/Chest: Effort normal and breath sounds normal. No respiratory distress. No has no wheezes. No rales.  Skin: Skin is warm and dry. Not diaphoretic.  Psychiatric: Normal mood and affect. Behavior is normal.   Lab Results  Component Value Date   WBC 5.1 08/15/2021   HGB 12.2 (L) 08/15/2021   HCT 38.9 (L) 08/15/2021   PLT 377 08/15/2021   GLUCOSE 96 08/15/2021   CHOL 173 06/07/2021   TRIG 129.0 06/07/2021   HDL 44.90 06/07/2021   LDLDIRECT 110.0 11/30/2016   LDLCALC 102 (H) 06/07/2021   ALT 45 (H) 08/15/2021   AST 36 08/15/2021   NA 139 08/15/2021   K 3.7 08/15/2021   CL 104 08/15/2021   CREATININE 1.09 08/15/2021   BUN 10 08/15/2021   CO2 27 08/15/2021   TSH 1.61 06/01/2020   INR 1.0 01/04/2021   HGBA1C 5.4 06/07/2021   DG Chest 2 View CLINICAL DATA:  Chest pain over the last 3 days  EXAM: CHEST - 2 VIEW  COMPARISON:  02/09/2021  FINDINGS: Heart size is normal. Mediastinal shadows are normal. The lungs are clear. No bronchial thickening. No infiltrate, mass, effusion or collapse. Pulmonary vascularity is normal. No bony abnormality.  IMPRESSION: Normal chest  Electronically Signed   By: Nelson Chimes M.D.   On: 08/15/2021 13:08    Assessment & Plan:    See  Problem List for Assessment and Plan of chronic medical problems.

## 2021-08-21 NOTE — Patient Instructions (Addendum)
     Medications changes include :   start fluoxetine 20 mg daily  Your prescription(s) have been submitted to your pharmacy. Please take as directed and contact our office if you believe you are having problem(s) with the medication(s).

## 2021-08-22 ENCOUNTER — Ambulatory Visit: Payer: 59 | Admitting: Internal Medicine

## 2021-08-22 ENCOUNTER — Other Ambulatory Visit: Payer: Self-pay

## 2021-08-22 ENCOUNTER — Encounter: Payer: Self-pay | Admitting: Internal Medicine

## 2021-08-22 VITALS — BP 118/80 | HR 66 | Temp 98.1°F | Ht 72.0 in | Wt 223.0 lb

## 2021-08-22 DIAGNOSIS — K219 Gastro-esophageal reflux disease without esophagitis: Secondary | ICD-10-CM

## 2021-08-22 DIAGNOSIS — I10 Essential (primary) hypertension: Secondary | ICD-10-CM

## 2021-08-22 DIAGNOSIS — R0789 Other chest pain: Secondary | ICD-10-CM

## 2021-08-22 DIAGNOSIS — F419 Anxiety disorder, unspecified: Secondary | ICD-10-CM | POA: Diagnosis not present

## 2021-08-22 MED ORDER — FLUOXETINE HCL 20 MG PO TABS: 20.0000 mg | ORAL_TABLET | Freq: Every day | ORAL | 1 refills | Status: DC

## 2021-08-22 NOTE — Assessment & Plan Note (Signed)
Acute Related to stress from work and home It is affecting his sleep and was likely the cause of his atypical chest pain He is willing to try medication-we will try fluoxetine 20 mg daily

## 2021-08-22 NOTE — Assessment & Plan Note (Signed)
Acute Evaluation in emergency room negative for ACS-possibly stress-induced versus musculoskeletal He is not sure if the ibuprofen and muscle relaxer helped or not Has not really had any recurrence of the pain even with exercise Likely stress-induced No further evaluation for cardiac cause at this time necessary

## 2021-08-22 NOTE — Assessment & Plan Note (Signed)
Chronic GERD controlled Continue pantoprazole 40 mg daily 

## 2021-08-22 NOTE — Assessment & Plan Note (Addendum)
Chronic BP well controlled Continue diovan-hct 320-25 mg daily, amlodipine 10 mg daily

## 2021-09-06 ENCOUNTER — Other Ambulatory Visit: Payer: Self-pay | Admitting: Internal Medicine

## 2021-10-15 ENCOUNTER — Other Ambulatory Visit: Payer: Self-pay | Admitting: Internal Medicine

## 2021-12-08 ENCOUNTER — Encounter: Payer: 59 | Admitting: Internal Medicine

## 2021-12-15 ENCOUNTER — Encounter: Payer: Self-pay | Admitting: Internal Medicine

## 2021-12-15 NOTE — Progress Notes (Signed)
Subjective:    Patient ID: Christopher [name], male    DOB: [birth year], 46 y.o.   MRN: [id number]   This visit occurred during the SARS-CoV-2 public health emergency.  Safety protocols were in place, including screening questions prior to the visit, additional usage of staff PPE, and extensive cleaning of exam room while observing appropriate contact time as indicated for disinfecting solutions.   HPI He is here for a physical exam.   He has no concerns.    Medications and allergies reviewed with patient and updated if appropriate.  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   Pulmonary nodules-CT coronary study 08/16/2020 02/09/2021   Lesion of liver 12/17/2020   Testosterone deficiency 12/17/2020   Memory changes 05/28/2019   Skin sensation disturbance 05/28/2019   Hyperglycemia 05/27/2019   History of gout 08/23/2017   Erectile dysfunction 08/23/2017   History of pancreatitis 02/16/2017   Renal mass, right 07/31/2014   Alcohol abuse 07/22/2014   Hepatic steatosis 07/22/2014   Equinus deformity of foot, acquired 03/02/2014   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 90 capsule 1   fenofibrate (TRICOR) 145 MG tablet TAKE 1 TABLET BY MOUTH  DAILY 90 tablet 3   FLUoxetine (PROZAC) 20 MG tablet Take 1 tablet (20 mg total) by mouth daily. 90 tablet 1   Multiple Vitamin (MULTIVITAMIN WITH MINERALS) TABS tablet Take 1  tablet by mouth daily.     pantoprazole (PROTONIX) 40 MG tablet Take 1 tablet (40 mg total) by mouth daily. 90 tablet 3   tadalafil (CIALIS) 20 MG tablet Take 20 mg by mouth daily as needed.     valsartan-hydrochlorothiazide (DIOVAN-HCT) 320-25 MG tablet TAKE 1 TABLET BY MOUTH  DAILY 90 tablet 3   No current facility-administered medications on file prior to visit.    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   Pain in lower limb 03/02/2014   Pancreatitis     Past Surgical History:  Procedure Laterality Date   CARPAL TUNNEL RELEASE Right 2008   COLONOSCOPY     IBS   CYST EXCISION Right 2015   "2nd toe"   ESOPHAGOGASTRODUODENOSCOPY     KNEE ARTHROSCOPY Right 1992   LASIK     REFRACTIVE SURGERY Bilateral 2013   SHOULDER SURGERY  10/2016   UPPER GI ENDOSCOPY     chest pain   WRIST FRACTURE SURGERY Right 2008   "got plate and screws"    Social History   Socioeconomic History   Marital status: Married    Spouse name: Not on file   Number of children: 2   Years of education: Not on  file   Highest education level: Not on file  Occupational History   Occupation: ENGINEERING SPEALIST    Employer: [employer]  Tobacco Use   Smoking status: Never   Smokeless tobacco: Never  Substance and Sexual Activity   Alcohol use: Yes    Comment: 5-6 drinks once a week   Drug use: No   Sexual activity: Yes  Other Topics Concern   Not on file  Social History Narrative   Married, second wife, daughter born in 80, son born in 2000 separated as of 2022   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[place]   3 caffeinated beverages daily   Alcohol several at a time but not every day maybe once or twice a week never smoker no other  tobacco no drug use   Social Determinants of Radio broadcast assistant Strain: Not on file  Food Insecurity: Not on file  Transportation Needs: Not on file  Physical Activity: Not on file  Stress: Not on file  Social Connections: Not on file    Family History  Problem Relation Age of Onset   Hypertension Mother    Hyperlipidemia Mother    Depression Mother    Hypertension Father    Alzheimer's disease Maternal Grandmother    Alcohol abuse Maternal Grandfather    Heart disease Maternal Grandfather    Heart failure Maternal Grandfather    Alzheimer's disease Paternal Grandmother    Alcohol abuse Paternal Grandfather    Depression Sister    Heart disease Maternal Uncle    Colon cancer Neg Hx     Review of Systems  Constitutional:  Negative for chills and fever.  Eyes:  Negative for visual disturbance.  Respiratory:  Negative for cough, shortness of breath and wheezing.   Cardiovascular:  Positive for leg swelling (mild at end of day). Negative for chest pain and palpitations.  Gastrointestinal:  Negative for abdominal pain and nausea.       Gerd controlled  Genitourinary:  Negative for difficulty urinating and dysuria.  Musculoskeletal:  Positive for back pain (chronic mild - chiro). Negative for arthralgias.  Skin:  Negative for rash.  Neurological:  Negative for light-headedness and headaches.  Psychiatric/Behavioral:  Negative for dysphoric mood. The patient is nervous/anxious.       Objective:   Vitals:   12/16/21 0812  BP: 138/84  Pulse: 75  Temp: 98.1 F (36.7 C)  SpO2: 95%   Filed Weights   12/16/21 0812  Weight: 225 lb 8 oz (102.3 kg)   Body mass index is 30.58 kg/m.  BP Readings from Last 3 Encounters:  12/16/21 138/84  08/22/21 118/80  08/15/21 140/88    Wt Readings from Last 3 Encounters:  12/16/21 225 lb 8 oz (102.3 kg)  08/22/21 223 lb (101.2 kg)  06/07/21 217 lb (98.4 kg)     Physical Exam Constitutional: He appears well-developed  and well-nourished. No distress.  HENT:  Head: Normocephalic and atraumatic.  Right Ear: External ear normal.  Left Ear: External ear normal.  Mouth/Throat: Oropharynx is clear and moist.  Normal ear canals and TM b/l  Eyes: Conjunctivae and EOM are normal.  Neck: Neck supple. No tracheal deviation present. No thyromegaly present.  No carotid bruit  Cardiovascular: Normal rate, regular rhythm, normal heart sounds and intact distal pulses.   No murmur heard. Pulmonary/Chest: Effort normal and breath sounds normal. No respiratory distress. He has no wheezes. He has no rales.  Abdominal: Soft. He  exhibits no distension. There is no tenderness.  Genitourinary: deferred  Musculoskeletal: He exhibits no edema.  Lymphadenopathy:   He has no cervical adenopathy.  Skin: Skin is warm and dry. He is not diaphoretic.  Psychiatric: He has a normal mood and affect. His behavior is normal.         Assessment & Plan:   Physical exam: Screening blood work  ordered Exercise   none Weight  encouraged weight loss Substance abuse   alcohol use - trying to keep it to a minimum - discussed importance of moderation - especially given his hepatic steatosis   Reviewed recommended immunizations.  Flu vac today   Health Maintenance  Topic Date Due   INFLUENZA VACCINE  06/27/2021   COVID-19 Vaccine (3 - Booster for [place] series) 01/01/2022 (Originally 01/27/2021)   TETANUS/TDAP  06/12/2024   COLONOSCOPY (Pts 45-59yrs Insurance coverage will need to be confirmed)  10/26/2024   Hepatitis C Screening  Completed   HIV Screening  Completed   HPV VACCINES  Aged Out     See Problem List for Assessment and Plan of chronic medical problems.

## 2021-12-15 NOTE — Patient Instructions (Addendum)
Flu immunization administered today.    Blood work was ordered.     Medications changes include :   None  Your prescription(s) have been submitted to your pharmacy. Please take as directed and contact our office if you believe you are having problem(s) with the medication(s).   Please followup in 6 months   Health Maintenance, Male Adopting a healthy lifestyle and getting preventive care are important in promoting health and wellness. Ask your health care provider about: The right schedule for you to have regular tests and exams. Things you can do on your own to prevent diseases and keep yourself healthy. What should I know about diet, weight, and exercise? Eat a healthy diet  Eat a diet that includes plenty of vegetables, fruits, low-fat dairy products, and lean protein. Do not eat a lot of foods that are high in solid fats, added sugars, or sodium. Maintain a healthy weight Body mass index (BMI) is a measurement that can be used to identify possible weight problems. It estimates body fat based on height and weight. Your health care provider can help determine your BMI and help you achieve or maintain a healthy weight. Get regular exercise Get regular exercise. This is one of the most important things you can do for your health. Most adults should: Exercise for at least 150 minutes each week. The exercise should increase your heart rate and make you sweat (moderate-intensity exercise). Do strengthening exercises at least twice a week. This is in addition to the moderate-intensity exercise. Spend less time sitting. Even light physical activity can be beneficial. Watch cholesterol and blood lipids Have your blood tested for lipids and cholesterol at 47 years of age, then have this test every 5 years. You may need to have your cholesterol levels checked more often if: Your lipid or cholesterol levels are high. You are older than 47 years of age. You are at high risk for heart  disease. What should I know about cancer screening? Many types of cancers can be detected early and may often be prevented. Depending on your health history and family history, you may need to have cancer screening at various ages. This may include screening for: Colorectal cancer. Prostate cancer. Skin cancer. Lung cancer. What should I know about heart disease, diabetes, and high blood pressure? Blood pressure and heart disease High blood pressure causes heart disease and increases the risk of stroke. This is more likely to develop in people who have high blood pressure readings or are overweight. Talk with your health care provider about your target blood pressure readings. Have your blood pressure checked: Every 3-5 years if you are 45-28 years of age. Every year if you are 25 years old or older. If you are between the ages of 56 and 56 and are a current or former smoker, ask your health care provider if you should have a one-time screening for abdominal aortic aneurysm (AAA). Diabetes Have regular diabetes screenings. This checks your fasting blood sugar level. Have the screening done: Once every three years after age 9 if you are at a normal weight and have a low risk for diabetes. More often and at a younger age if you are overweight or have a high risk for diabetes. What should I know about preventing infection? Hepatitis B If you have a higher risk for hepatitis B, you should be screened for this virus. Talk with your health care provider to find out if you are at risk for hepatitis B infection. Hepatitis  C Blood testing is recommended for: Everyone born from [birth year] through 1966. Anyone with known risk factors for hepatitis C. Sexually transmitted infections (STIs) You should be screened each year for STIs, including gonorrhea and chlamydia, if: You are sexually active and are younger than 47 years of age. You are older than 47 years of age and your health care provider tells you  that you are at risk for this type of infection. Your sexual activity has changed since you were last screened, and you are at increased risk for chlamydia or gonorrhea. Ask your health care provider if you are at risk. Ask your health care provider about whether you are at high risk for HIV. Your health care provider may recommend a prescription medicine to help prevent HIV infection. If you choose to take medicine to prevent HIV, you should first get tested for HIV. You should then be tested every 3 months for as long as you are taking the medicine. Follow these instructions at home: Alcohol use Do not drink alcohol if your health care provider tells you not to drink. If you drink alcohol: Limit how much you have to 0-2 drinks a day. Know how much alcohol is in your drink. In the [place] one drink equals one 12 oz bottle of beer (355 mL), one 5 oz glass of wine (148 mL), or one 1 oz glass of hard liquor (44 mL). Lifestyle Do not use any products that contain nicotine or tobacco. These products include cigarettes, chewing tobacco, and vaping devices, such as e-cigarettes. If you need help quitting, ask your health care provider. Do not use street drugs. Do not share needles. Ask your health care provider for help if you need support or information about quitting drugs. General instructions Schedule regular health, dental, and eye exams. Stay current with your vaccines. Tell your health care provider if: You often feel depressed. You have ever been abused or do not feel safe at home. Summary Adopting a healthy lifestyle and getting preventive care are important in promoting health and wellness. Follow your health care provider's instructions about healthy diet, exercising, and getting tested or screened for diseases. Follow your health care provider's instructions on monitoring your cholesterol and blood pressure. This information is not intended to replace advice given to you by your health  care provider. Make sure you discuss any questions you have with your health care provider. Document Revised: 04/04/2021 Document Reviewed: 04/04/2021 Elsevier Patient Education  [place].

## 2021-12-16 ENCOUNTER — Other Ambulatory Visit: Payer: Self-pay

## 2021-12-16 ENCOUNTER — Ambulatory Visit (INDEPENDENT_AMBULATORY_CARE_PROVIDER_SITE_OTHER): Payer: 59 | Admitting: Internal Medicine

## 2021-12-16 VITALS — BP 138/84 | HR 75 | Temp 98.1°F | Ht 72.0 in | Wt 225.5 lb

## 2021-12-16 DIAGNOSIS — E781 Pure hyperglyceridemia: Secondary | ICD-10-CM

## 2021-12-16 DIAGNOSIS — I10 Essential (primary) hypertension: Secondary | ICD-10-CM

## 2021-12-16 DIAGNOSIS — F1010 Alcohol abuse, uncomplicated: Secondary | ICD-10-CM

## 2021-12-16 DIAGNOSIS — K219 Gastro-esophageal reflux disease without esophagitis: Secondary | ICD-10-CM | POA: Diagnosis not present

## 2021-12-16 DIAGNOSIS — Z23 Encounter for immunization: Secondary | ICD-10-CM

## 2021-12-16 DIAGNOSIS — D649 Anemia, unspecified: Secondary | ICD-10-CM | POA: Insufficient documentation

## 2021-12-16 DIAGNOSIS — Z0000 Encounter for general adult medical examination without abnormal findings: Secondary | ICD-10-CM

## 2021-12-16 DIAGNOSIS — K760 Fatty (change of) liver, not elsewhere classified: Secondary | ICD-10-CM

## 2021-12-16 DIAGNOSIS — F419 Anxiety disorder, unspecified: Secondary | ICD-10-CM

## 2021-12-16 LAB — CBC WITH DIFFERENTIAL/PLATELET
Basophils Absolute: 0 10*3/uL (ref 0.0–0.1)
Basophils Relative: 0.9 % (ref 0.0–3.0)
Eosinophils Absolute: 0.3 10*3/uL (ref 0.0–0.7)
Eosinophils Relative: 5.2 % — ABNORMAL HIGH (ref 0.0–5.0)
HCT: 37.1 % — ABNORMAL LOW (ref 39.0–52.0)
Hemoglobin: 12.2 g/dL — ABNORMAL LOW (ref 13.0–17.0)
Lymphocytes Relative: 16.3 % (ref 12.0–46.0)
Lymphs Abs: 0.9 10*3/uL (ref 0.7–4.0)
MCHC: 32.9 g/dL (ref 30.0–36.0)
MCV: 82 fl (ref 78.0–100.0)
Monocytes Absolute: 0.8 10*3/uL (ref 0.1–1.0)
Monocytes Relative: 14.5 % — ABNORMAL HIGH (ref 3.0–12.0)
Neutro Abs: 3.4 10*3/uL (ref 1.4–7.7)
Neutrophils Relative %: 63.1 % (ref 43.0–77.0)
Platelets: 323 10*3/uL (ref 150.0–400.0)
RBC: 4.52 Mil/uL (ref 4.22–5.81)
RDW: 15.2 % (ref 11.5–15.5)
WBC: 5.4 10*3/uL (ref 4.0–10.5)

## 2021-12-16 LAB — COMPREHENSIVE METABOLIC PANEL
ALT: 42 U/L (ref 0–53)
AST: 33 U/L (ref 0–37)
Albumin: 4.1 g/dL (ref 3.5–5.2)
Alkaline Phosphatase: 34 U/L — ABNORMAL LOW (ref 39–117)
BUN: 15 mg/dL (ref 6–23)
CO2: 30 mEq/L (ref 19–32)
Calcium: 9 mg/dL (ref 8.4–10.5)
Chloride: 102 mEq/L (ref 96–112)
Creatinine, Ser: 1.19 mg/dL (ref 0.40–1.50)
GFR: 73.39 mL/min (ref >60.00)
Glucose, Bld: 93 mg/dL (ref 70–99)
Potassium: 3.8 mEq/L (ref 3.5–5.1)
Sodium: 139 mEq/L (ref 135–145)
Total Bilirubin: 0.5 mg/dL (ref 0.2–1.2)
Total Protein: 6.9 g/dL (ref 6.0–8.3)

## 2021-12-16 LAB — LIPID PANEL
Cholesterol: 153 mg/dL (ref 0–200)
HDL: 42.9 mg/dL (ref >39.00)
LDL Cholesterol: 88 mg/dL (ref 0–99)
NonHDL: 109.8
Total CHOL/HDL Ratio: 4
Triglycerides: 109 mg/dL (ref 0.0–149.0)
VLDL: 21.8 mg/dL (ref 0.0–40.0)

## 2021-12-16 LAB — IBC PANEL
Iron: 68 ug/dL (ref 42–165)
Saturation Ratios: 12.7 % — ABNORMAL LOW (ref 20.0–50.0)
TIBC: 536.2 ug/dL — ABNORMAL HIGH (ref 250.0–450.0)
Transferrin: 383 mg/dL — ABNORMAL HIGH (ref 212.0–360.0)

## 2021-12-16 LAB — TSH: TSH: 2.52 u[IU]/mL (ref 0.35–5.50)

## 2021-12-16 MED ORDER — AMLODIPINE BESYLATE 10 MG PO TABS: 10.0000 mg | ORAL_TABLET | Freq: Every day | ORAL | 3 refills | Status: DC

## 2021-12-16 NOTE — Assessment & Plan Note (Signed)
Chronic Stressed importance of weight loss, minimal alcohol intake Discussed increased risk of developing cirrhosis

## 2021-12-16 NOTE — Assessment & Plan Note (Signed)
Chronic Blood pressure well controlled CMP Continue amlodipine 10 mg daily, Diovan-HCT 320-25 mg daily

## 2021-12-16 NOTE — Assessment & Plan Note (Signed)
Chronic Controlled, Stable Continue fluoxetine 20 mg daily 

## 2021-12-16 NOTE — Assessment & Plan Note (Signed)
Chronic GERD controlled Continue pantoprazole 40 mg daily 

## 2021-12-16 NOTE — Assessment & Plan Note (Signed)
Chronic Still drinking alcohol  - states he has cut down and trying to keep it to a minimum amount - discussed importance of moderation - especially given his hepatic steatosis

## 2021-12-16 NOTE — Assessment & Plan Note (Signed)
Chronic Regular exercise and healthy diet encouraged Check lipid panel  Continue fenofibrate 145 mg daily

## 2021-12-16 NOTE — Assessment & Plan Note (Signed)
H/o anemia Cbc, iron panel

## 2021-12-16 NOTE — Addendum Note (Signed)
Addended by: Marcina Millard on: 12/16/2021 03:38 PM   Modules accepted: Orders

## 2022-01-17 ENCOUNTER — Encounter: Payer: Self-pay | Admitting: Internal Medicine

## 2022-01-21 IMAGING — US US BIOPSY CORE LIVER
1 series · 13 of 25 positions shown · non-contrast
Comparison: none

INDICATION: 44-year-old male with hepatic steatosis, persistently elevated LFTs
and a nonspecific imaging pattern of miliary lesions on CT and MR
imaging. He presents for ultrasound-guided core biopsy of the liver
for further evaluation.

[Series 1: us biopsy core liver · 13 of 31 slices shown]
[im 1/31]
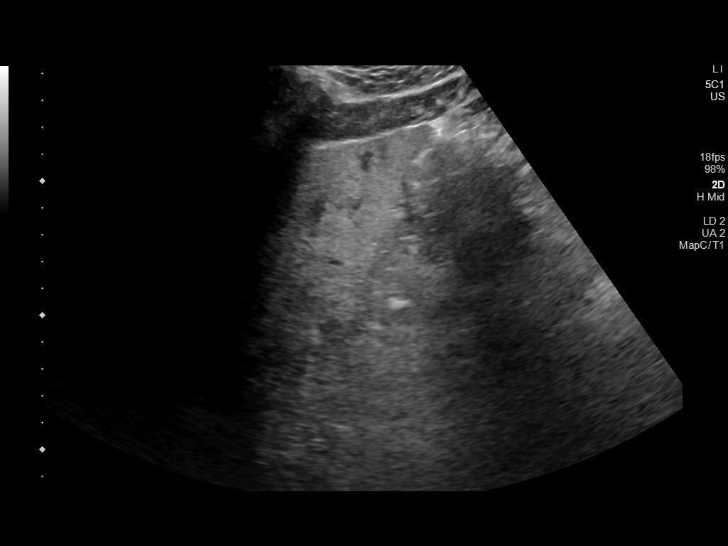
[im 3/31]
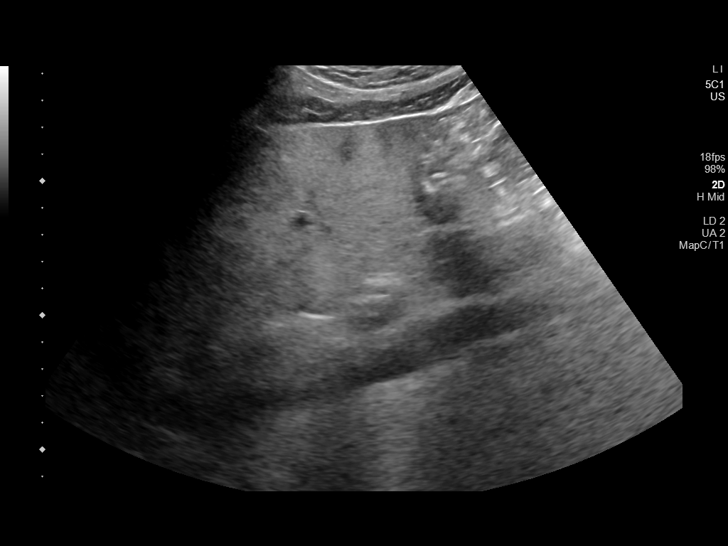
[im 6/31]
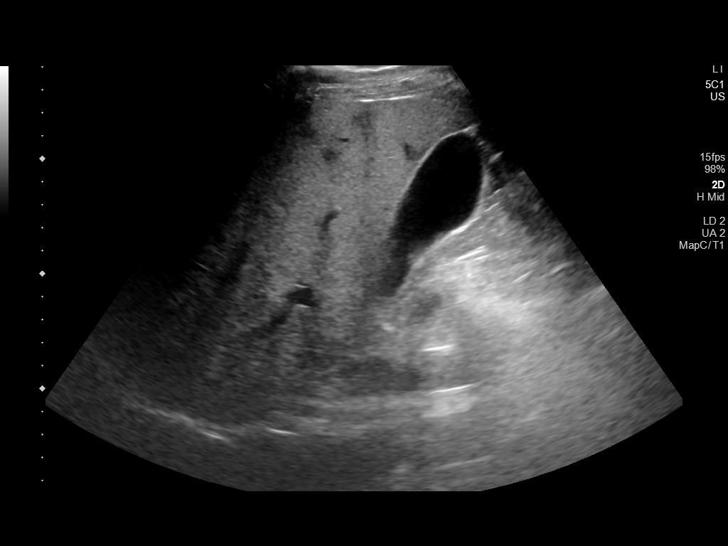
[im 8/31]
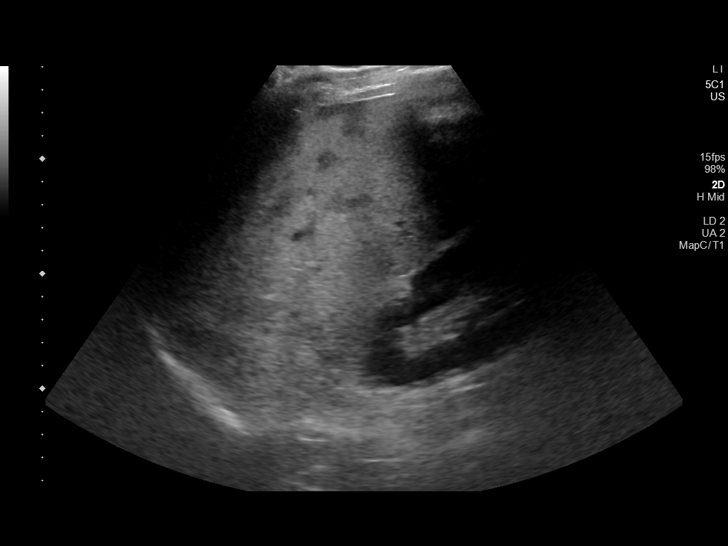
[im 11/31]
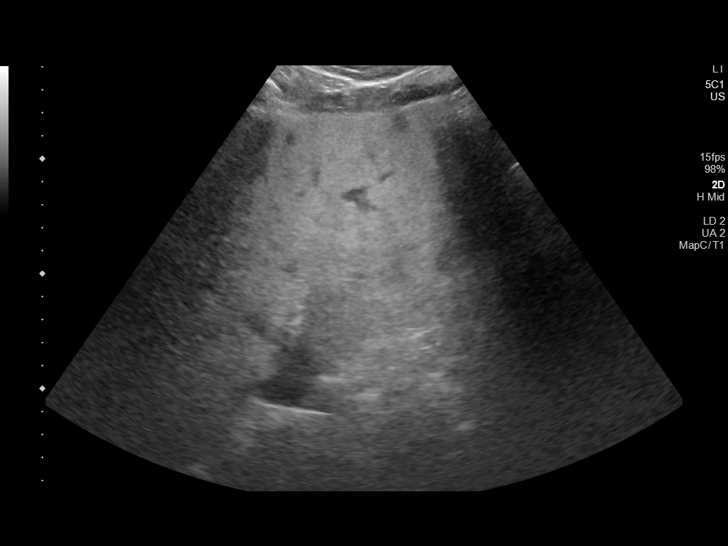
[im 13/31]
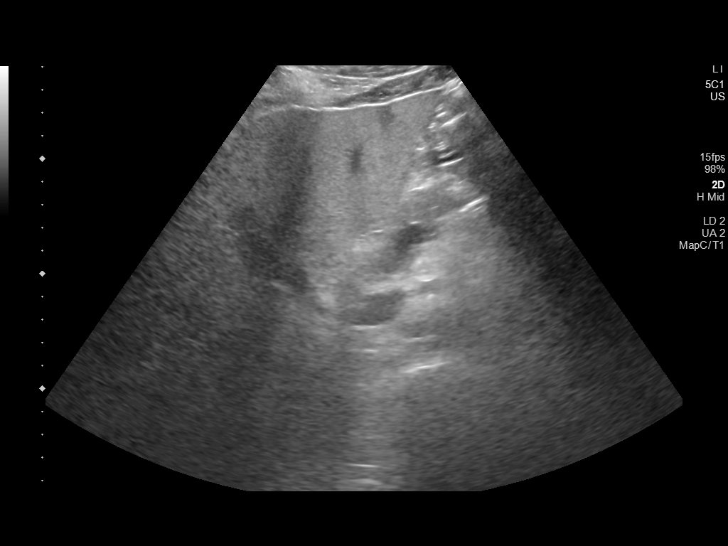
[im 16/31]
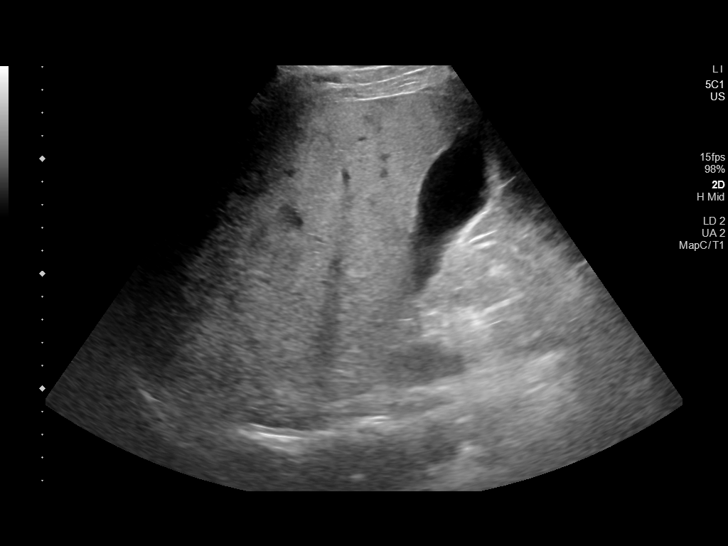
[im 18/31]
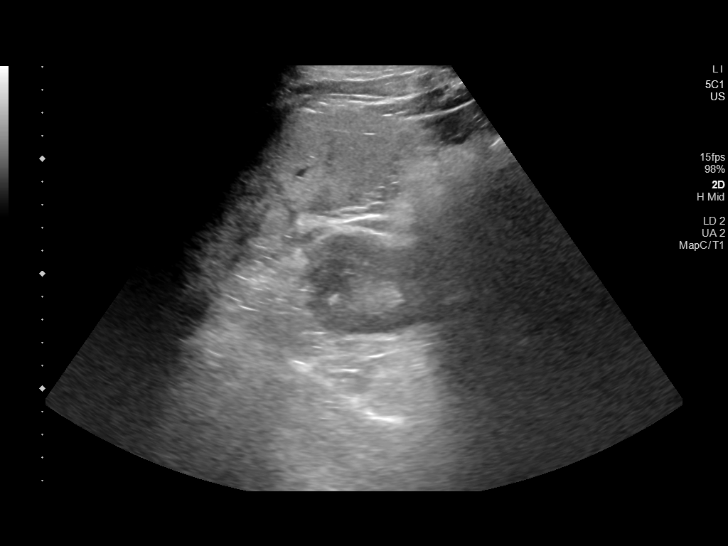
[im 21/31]
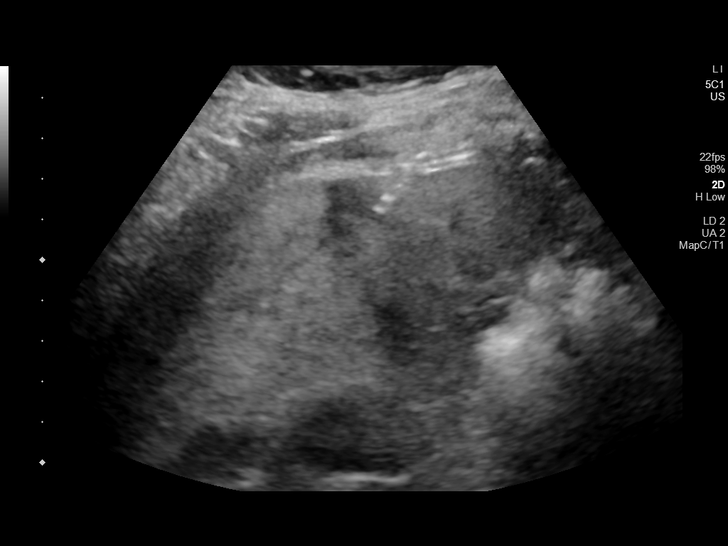
[im 23/31]
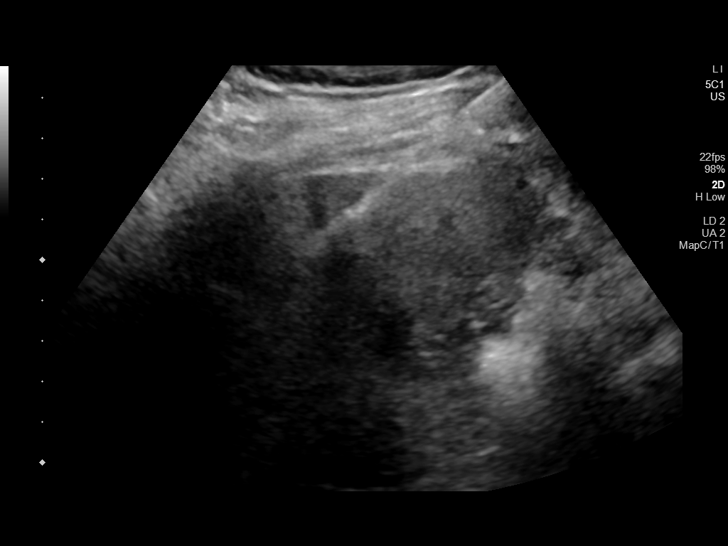
[im 26/31]
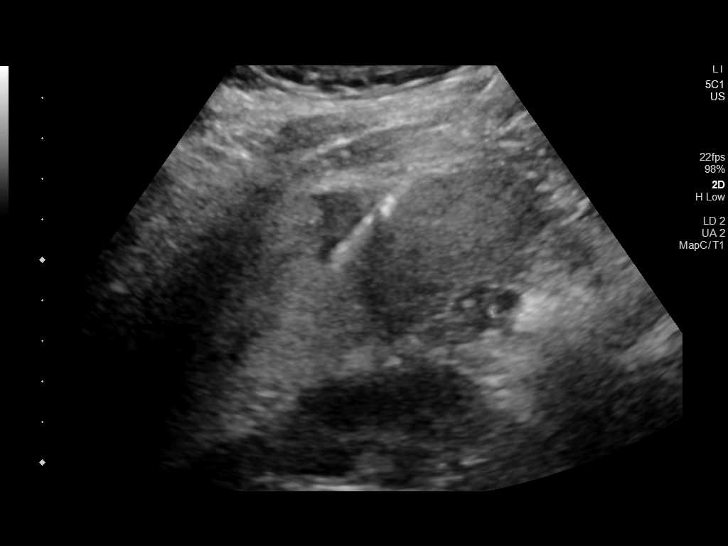
[im 28/31]
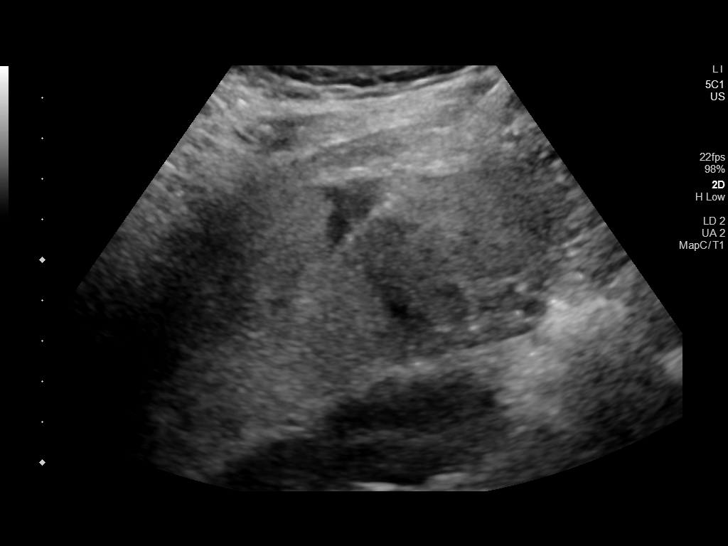
[im 31/31]
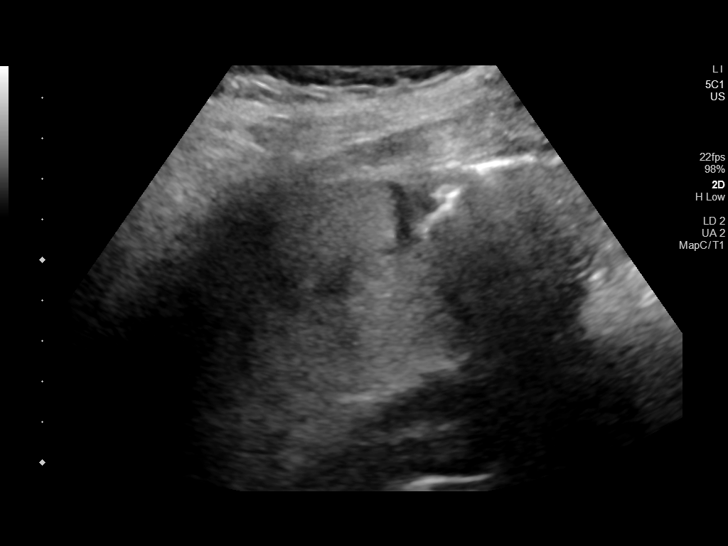

[13 of 25 positions shown; findings below may reference images not displayed]

EXAM:
ULTRASOUND BIOPSY CORE LIVER

MEDICATIONS:
None.

ANESTHESIA/SEDATION:
Moderate (conscious) sedation was employed during this procedure. A
total of Versed 4 mg and Fentanyl 100 mcg was administered
intravenously.

Moderate Sedation Time: 13 minutes. The patient's level of
consciousness and vital signs were monitored continuously by
radiology nursing throughout the procedure under my direct
supervision.

FLUOROSCOPY TIME:  None.

COMPLICATIONS:
None immediate.

PROCEDURE:
Informed written consent was obtained from the patient after a
thorough discussion of the procedural risks, benefits and
alternatives. All questions were addressed. Maximal Sterile Barrier
Technique was utilized including caps, mask, sterile gowns, sterile
gloves, sterile drape, hand hygiene and skin antiseptic. A timeout
was performed prior to the initiation of the procedure.

Ultrasound was used to interrogate the liver. The background
parenchyma is diffusely echogenic and coarsened consistent with
hepatic steatosis. There are multifocal irregular stellate regions
of relatively decreased echogenicity. The imaging appearance is
nonspecific. A relatively large region of abnormality in the caudal
aspect of hepatic segment 6 was identified and targeted. The
overlying skin was sterilely prepped and draped in the standard
fashion using chlorhexidine skin prep. Local anesthesia was attained
by infiltration with 1% lidocaine. A small dermatotomy was made.

Under real-time ultrasound guidance, a 17 gauge introducer needle
was advanced through the liver parenchyma and positioned at the
margin of the mass. Multiple 18 gauge core biopsies were then
obtained coaxially using the [employer] automated biopsy device.
Biopsy specimens were placed in formalin and saline and delivered to
pathology for further analysis.
IMPRESSION: Technically successful core biopsy of a region of
heterogeneity/irregularity in the inferior aspect of hepatic segment
6. Biopsy specimens are being sent for surgical pathology, fungal
culture and mycobacterial culture.

## 2022-02-08 ENCOUNTER — Telehealth: Payer: Self-pay

## 2022-02-09 NOTE — Progress Notes (Signed)
? ? ?Subjective:  ? ? Patient ID: Christopher [name], male    DOB: [birth year], 46 y.o.   MRN: [id number] ? ?This visit occurred during the SARS-CoV-2 public health emergency.  Safety protocols were in place, including screening questions prior to the visit, additional usage of staff PPE, and extensive cleaning of exam room while observing appropriate contact time as indicated for disinfecting solutions. ? ? ? ?HPI ?Christopher [name] is here for  ?Chief Complaint  ?Patient presents with  ? Ear Pain  ?  Ear pain with chewing on right side  ? ? ? ? ?Fluid in ear - right ear only - started a couple of weeks. Hears fluid when he chews.  No pain.  No chagne in hearing.  No fever, discharge, congestion, ST.  ? ? ?Medications and allergies reviewed with patient and updated if appropriate. ? ?Current Outpatient Medications on File Prior to Visit  ?Medication Sig Dispense Refill  ? amLODipine (NORVASC) 10 MG tablet Take 1 tablet (10 mg total) by mouth daily. 90 tablet 3  ? clidinium-chlordiazePOXIDE (LIBRAX) 5-2.5 MG capsule Take 1 capsule by mouth daily. 90 capsule 1  ? fenofibrate (TRICOR) 145 MG tablet TAKE 1 TABLET BY MOUTH  DAILY 90 tablet 3  ? Multiple Vitamin (MULTIVITAMIN WITH MINERALS) TABS tablet Take 1 tablet by mouth daily.    ? pantoprazole (PROTONIX) 40 MG tablet Take 1 tablet (40 mg total) by mouth daily. 90 tablet 3  ? tadalafil (CIALIS) 20 MG tablet Take 20 mg by mouth daily as needed.    ? valsartan-hydrochlorothiazide (DIOVAN-HCT) 320-25 MG tablet TAKE 1 TABLET BY MOUTH  DAILY 90 tablet 3  ? ?No current facility-administered medications on file prior to visit.  ? ? ?Review of Systems  ?Constitutional:  Negative for fever.  ?HENT:  Positive for postnasal drip and rhinorrhea. Negative for congestion, ear discharge, ear pain, hearing loss and sore throat.   ?Neurological:  Negative for dizziness and headaches.  ? ?   ?Objective:  ? ?Vitals:  ? 02/10/22 1554  ?BP: 140/72  ?Pulse: 71  ?Temp: 98.1 ?F (36.7 ?C)  ?SpO2: 98%   ? ?BP Readings from Last 3 Encounters:  ?02/10/22 140/72  ?12/16/21 138/84  ?08/22/21 118/80  ? ?Wt Readings from Last 3 Encounters:  ?02/10/22 234 lb (106.1 kg)  ?12/16/21 225 lb 8 oz (102.3 kg)  ?08/22/21 223 lb (101.2 kg)  ? ?Body mass index is 31.74 kg/m?. ? ?  ?Physical Exam ?Constitutional:   ?   General: He is not in acute distress. ?   Appearance: Normal appearance. He is not ill-appearing.  ?HENT:  ?   Head: Normocephalic.  ?   Right Ear: Ear canal and external ear normal. There is no impacted cerumen.  ?   Left Ear: Tympanic membrane, ear canal and external ear normal. There is no impacted cerumen.  ?   Ears:  ?   Comments: Right TM dull, no erythema ?   Mouth/Throat:  ?   Mouth: Mucous membranes are moist.  ?   Pharynx: No oropharyngeal exudate or posterior oropharyngeal erythema.  ?Eyes:  ?   Conjunctiva/sclera: Conjunctivae normal.  ?Cardiovascular:  ?   Rate and Rhythm: Normal rate and regular rhythm.  ?Pulmonary:  ?   Effort: Pulmonary effort is normal. No respiratory distress.  ?   Breath sounds: Normal breath sounds. No wheezing or rales.  ?Musculoskeletal:  ?   Cervical back: Neck supple. No tenderness.  ?Lymphadenopathy:  ?   Cervical: No cervical  adenopathy.  ?Skin: ?   General: Skin is warm and dry.  ?   Findings: No rash.  ?Neurological:  ?   Mental Status: He is alert.  ? ?   ? ? ? ? ? ?Assessment & Plan:  ? ? ?See Problem List for Assessment and Plan of chronic medical problems.  ? ? ? ? ?

## 2022-02-10 ENCOUNTER — Other Ambulatory Visit: Payer: Self-pay

## 2022-02-10 ENCOUNTER — Encounter: Payer: Self-pay | Admitting: Internal Medicine

## 2022-02-10 ENCOUNTER — Ambulatory Visit: Payer: 59 | Admitting: Internal Medicine

## 2022-02-10 VITALS — BP 140/72 | HR 71 | Temp 98.1°F | Ht 72.0 in | Wt 234.0 lb

## 2022-02-10 DIAGNOSIS — I10 Essential (primary) hypertension: Secondary | ICD-10-CM

## 2022-02-10 DIAGNOSIS — K219 Gastro-esophageal reflux disease without esophagitis: Secondary | ICD-10-CM

## 2022-02-10 DIAGNOSIS — H6981 Other specified disorders of Eustachian tube, right ear: Secondary | ICD-10-CM | POA: Diagnosis not present

## 2022-02-10 MED ORDER — PREDNISONE 50 MG PO TABS: 50.0000 mg | ORAL_TABLET | Freq: Every day | ORAL | 0 refills | Status: AC

## 2022-02-10 NOTE — Patient Instructions (Addendum)
? ? ? ? ?Medications changes include :   prednisone 50 gm daily x 5 days.  ? ? ?Your prescription(s) have been sent to your pharmacy.  ? ? ? ?Return if symptoms worsen or fail to improve. ? ? ? ?Eustachian Tube Dysfunction ?Eustachian tube dysfunction refers to a condition in which a blockage develops in the narrow passage that connects the middle ear to the back of the nose (eustachian tube). The eustachian tube regulates air pressure in the middle ear by letting air move between the ear and nose. It also helps to drain fluid from the middle ear space. ?Eustachian tube dysfunction can affect one or both ears. When the eustachian tube does not function properly, air pressure, fluid, or both can build up in the middle ear. ?What are the causes? ?This condition occurs when the eustachian tube becomes blocked or cannot open normally. Common causes of this condition include: ?Ear infections. ?Colds and other infections that affect the nose, mouth, and throat (upper respiratory tract). ?Allergies. ?Irritation from cigarette smoke. ?Irritation from stomach acid coming up into the esophagus (gastroesophageal reflux). The esophagus is the part of the body that moves food from the mouth to the stomach. ?Sudden changes in air pressure, such as from descending in an airplane or scuba diving. ?Abnormal growths in the nose or throat, such as: ?Growths that line the nose (nasal polyps). ?Abnormal growth of cells (tumors). ?Enlarged tissue at the back of the throat (adenoids). ?What increases the risk? ?You are more likely to develop this condition if: ?You smoke. ?You are overweight. ?You are a child who has: ?Certain birth defects of the mouth, such as cleft palate. ?Large tonsils or adenoids. ?What are the signs or symptoms? ?Common symptoms of this condition include: ?A feeling of fullness in the ear. ?Ear pain. ?Clicking or popping noises in the ear. ?Ringing in the ear (tinnitus). ?Hearing loss. ?Loss of  balance. ?Dizziness. ?Symptoms may get worse when the air pressure around you changes, such as when you travel to an area of high elevation, fly on an airplane, or go scuba diving. ?How is this diagnosed? ?This condition may be diagnosed based on: ?Your symptoms. ?A physical exam of your ears, nose, and throat. ?Tests, such as those that measure: ?The movement of your eardrum. ?Your hearing (audiometry). ?How is this treated? ?Treatment depends on the cause and severity of your condition. ?In mild cases, you may relieve your symptoms by moving air into your ears. This is called "popping the ears." ?In more severe cases, or if you have symptoms of fluid in your ears, treatment may include: ?Medicines to relieve congestion (decongestants). ?Medicines that treat allergies (antihistamines). ?Nasal sprays or ear drops that contain medicines that reduce swelling (steroids). ?A procedure to drain the fluid in your eardrum. In this procedure, a small tube may be placed in the eardrum to: ?Drain the fluid. ?Restore the air in the middle ear space. ?A procedure to insert a balloon device through the nose to inflate the opening of the eustachian tube (balloon dilation). ?Follow these instructions at home: ?Lifestyle ?Do not do any of the following until your health care provider approves: ?Travel to high altitudes. ?Fly in airplanes. ?Work in a Pension scheme manager or room. ?Scuba dive. ?Do not use any products that contain nicotine or tobacco. These products include cigarettes, chewing tobacco, and vaping devices, such as e-cigarettes. If you need help quitting, ask your health care provider. ?Keep your ears dry. Wear fitted earplugs during showering and bathing. Dry  your ears completely after. ?General instructions ?Take over-the-counter and prescription medicines only as told by your health care provider. ?Use techniques to help pop your ears as recommended by your health care provider. These may include: ?Chewing  gum. ?Yawning. ?Frequent, forceful swallowing. ?Closing your mouth, holding your nose closed, and gently blowing as if you are trying to blow air out of your nose. ?Keep all follow-up visits. This is important. ?Contact a health care provider if: ?Your symptoms do not go away after treatment. ?Your symptoms come back after treatment. ?You are unable to pop your ears. ?You have: ?A fever. ?Pain in your ear. ?Pain in your head or neck. ?Fluid draining from your ear. ?Your hearing suddenly changes. ?You become very dizzy. ?You lose your balance. ?Get help right away if: ?You have a sudden, severe increase in any of your symptoms. ?Summary ?Eustachian tube dysfunction refers to a condition in which a blockage develops in the eustachian tube. ?It can be caused by ear infections, allergies, inhaled irritants, or abnormal growths in the nose or throat. ?Symptoms may include ear pain or fullness, hearing loss, or ringing in the ears. ?Mild cases are treated with techniques to unblock the ears, such as yawning or chewing gum. ?More severe cases are treated with medicines or procedures. ?This information is not intended to replace advice given to you by your health care provider. Make sure you discuss any questions you have with your health care provider. ?Document Revised: 01/24/2021 Document Reviewed: 01/24/2021 ?Elsevier Patient Education ? [employer]. ? ?

## 2022-02-11 NOTE — Assessment & Plan Note (Signed)
Chronic ?GERD controlled ?Continue pantoprazole 40 mg daily -- advised to take prednisone with food to prevent GERD flare ? ?

## 2022-02-11 NOTE — Assessment & Plan Note (Signed)
Acute ?No evidence of an infection ?Symptoms and exam c/w ETD ?Start prednisone 50 mg daily ?Can try flonase ? ?If no improvement - will refer to ENT ?

## 2022-02-11 NOTE — Assessment & Plan Note (Signed)
Chronic ?BP controlled ?Continue amlodipine 10 mg daily, diovan-hct 320-25 mg qd ?

## 2022-02-27 ENCOUNTER — Encounter: Payer: Self-pay | Admitting: Internal Medicine

## 2022-03-14 NOTE — Telephone Encounter (Signed)
Completed.

## 2022-06-15 ENCOUNTER — Encounter: Payer: Self-pay | Admitting: Internal Medicine

## 2022-06-15 NOTE — Progress Notes (Signed)
Subjective:    Patient ID: Christopher [name], male    DOB: 1976-01-19, 46 y.o.   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  Always gone in the morning.  A little winded walking up stairs - ? Residual from covid.  No regular exercise.  Increased SOB with playing baseball than usual - ? Related to doing less exercise.    Eats out a lot.    Medications and allergies reviewed with patient and updated if appropriate.  Current Outpatient Medications on File Prior to Visit  Medication Sig Dispense Refill   amLODipine (NORVASC) 10 MG tablet Take 1 tablet (10 mg total) by mouth daily. 90 tablet 3   fenofibrate (TRICOR) 145 MG tablet TAKE 1 TABLET BY MOUTH  DAILY 90 tablet 3   Multiple Vitamin (MULTIVITAMIN WITH MINERALS) TABS tablet Take 1 tablet by mouth daily.     pantoprazole (PROTONIX) 40 MG tablet Take 1 tablet (40 mg total) by mouth daily. 90 tablet 3   tadalafil (CIALIS) 20 MG tablet Take 20 mg by mouth daily as needed.     valsartan-hydrochlorothiazide (DIOVAN-HCT) 320-25 MG tablet TAKE 1 TABLET BY MOUTH  DAILY 90 tablet 3   No current facility-administered medications on file prior to visit.     Review of Systems  Constitutional:  Negative for chills and fever.  Respiratory:  Positive for cough (from allergies) and shortness of breath. Negative for wheezing.   Cardiovascular:  Positive for leg swelling. Negative for chest pain and palpitations.  Gastrointestinal:  Negative for abdominal pain and nausea.  Neurological:  Positive for headaches (work related - tension). Negative for light-headedness.  Psychiatric/Behavioral:  Positive for sleep disturbance.        Objective:   Vitals:   06/16/22 0804  BP: 122/82  Pulse: 67  Temp: 98.6 F (37 C)  SpO2: 96%   BP Readings  from Last 3 Encounters:  06/16/22 122/82  02/10/22 140/72  12/16/21 138/84   Wt Readings from Last 3 Encounters:  06/16/22 227 lb (103 kg)  02/10/22 234 lb (106.1 kg)  12/16/21 225 lb 8 oz (102.3 kg)   Body mass index is 30.79 kg/m.    Physical Exam Constitutional:      General: He is not in acute distress.    Appearance: Normal appearance. He is not ill-appearing.  HENT:     Head: Normocephalic and atraumatic.  Eyes:     Conjunctiva/sclera: Conjunctivae normal.  Cardiovascular:     Rate and Rhythm: Normal rate and regular rhythm.     Heart sounds: Normal heart sounds. No murmur heard. Pulmonary:     Effort: Pulmonary effort is normal. No respiratory distress.     Breath sounds: Normal breath sounds. No wheezing or rales.  Musculoskeletal:     Right lower leg: No edema.     Left lower leg: No edema.  Skin:    General: Skin is warm and dry.     Findings: No rash.  Neurological:     Mental Status: He is alert. Mental status is at baseline.  Psychiatric:        Mood and Affect: Mood normal.        Lab Results  Component Value Date   WBC 5.4 12/16/2021   HGB 12.2 (L) 12/16/2021   HCT 37.1 (  L) 12/16/2021   PLT 323.0 12/16/2021   GLUCOSE 93 12/16/2021   CHOL 153 12/16/2021   TRIG 109.0 12/16/2021   HDL 42.90 12/16/2021   LDLDIRECT 110.0 11/30/2016   LDLCALC 88 12/16/2021   ALT 42 12/16/2021   AST 33 12/16/2021   NA 139 12/16/2021   K 3.8 12/16/2021   CL 102 12/16/2021   CREATININE 1.19 12/16/2021   BUN 15 12/16/2021   CO2 30 12/16/2021   TSH 2.52 12/16/2021   INR 1.0 01/04/2021   HGBA1C 5.4 06/07/2021     Assessment & Plan:    See Problem List for Assessment and Plan of chronic medical problems.

## 2022-06-15 NOTE — Patient Instructions (Addendum)
     Blood work was ordered.     Medications changes include :   None     A hear ultrasound and CT of your chest was ordered.    Return in about 6 months (around 12/17/2022) for Physical Exam.

## 2022-06-16 ENCOUNTER — Ambulatory Visit: Payer: 59 | Admitting: Internal Medicine

## 2022-06-16 VITALS — BP 122/82 | HR 67 | Temp 98.6°F | Ht 72.0 in | Wt 227.0 lb

## 2022-06-16 DIAGNOSIS — K760 Fatty (change of) liver, not elsewhere classified: Secondary | ICD-10-CM | POA: Diagnosis not present

## 2022-06-16 DIAGNOSIS — K219 Gastro-esophageal reflux disease without esophagitis: Secondary | ICD-10-CM | POA: Diagnosis not present

## 2022-06-16 DIAGNOSIS — F1010 Alcohol abuse, uncomplicated: Secondary | ICD-10-CM | POA: Diagnosis not present

## 2022-06-16 DIAGNOSIS — E781 Pure hyperglyceridemia: Secondary | ICD-10-CM | POA: Diagnosis not present

## 2022-06-16 DIAGNOSIS — D649 Anemia, unspecified: Secondary | ICD-10-CM

## 2022-06-16 DIAGNOSIS — R739 Hyperglycemia, unspecified: Secondary | ICD-10-CM

## 2022-06-16 DIAGNOSIS — I10 Essential (primary) hypertension: Secondary | ICD-10-CM

## 2022-06-16 DIAGNOSIS — R0609 Other forms of dyspnea: Secondary | ICD-10-CM | POA: Insufficient documentation

## 2022-06-16 DIAGNOSIS — R600 Localized edema: Secondary | ICD-10-CM

## 2022-06-16 DIAGNOSIS — R918 Other nonspecific abnormal finding of lung field: Secondary | ICD-10-CM | POA: Insufficient documentation

## 2022-06-16 LAB — COMPREHENSIVE METABOLIC PANEL
ALT: 50 U/L (ref 0–53)
AST: 35 U/L (ref 0–37)
Albumin: 4.6 g/dL (ref 3.5–5.2)
Alkaline Phosphatase: 42 U/L (ref 39–117)
BUN: 15 mg/dL (ref 6–23)
CO2: 27 mEq/L (ref 19–32)
Calcium: 9.4 mg/dL (ref 8.4–10.5)
Chloride: 104 mEq/L (ref 96–112)
Creatinine, Ser: 1.08 mg/dL (ref 0.40–1.50)
GFR: 82.16 mL/min (ref >60.00)
Glucose, Bld: 96 mg/dL (ref 70–99)
Potassium: 3.7 mEq/L (ref 3.5–5.1)
Sodium: 141 mEq/L (ref 135–145)
Total Bilirubin: 0.6 mg/dL (ref 0.2–1.2)
Total Protein: 7.4 g/dL (ref 6.0–8.3)

## 2022-06-16 LAB — LIPID PANEL
Cholesterol: 167 mg/dL (ref 0–200)
HDL: 39 mg/dL — ABNORMAL LOW (ref >39.00)
LDL Cholesterol: 100 mg/dL — ABNORMAL HIGH (ref 0–99)
NonHDL: 128.32
Total CHOL/HDL Ratio: 4
Triglycerides: 140 mg/dL (ref 0.0–149.0)
VLDL: 28 mg/dL (ref 0.0–40.0)

## 2022-06-16 LAB — CBC WITH DIFFERENTIAL/PLATELET
Basophils Absolute: 0 10*3/uL (ref 0.0–0.1)
Basophils Relative: 1.1 % (ref 0.0–3.0)
Eosinophils Absolute: 0.3 10*3/uL (ref 0.0–0.7)
Eosinophils Relative: 6.9 % — ABNORMAL HIGH (ref 0.0–5.0)
HCT: 35.3 % — ABNORMAL LOW (ref 39.0–52.0)
Hemoglobin: 11.5 g/dL — ABNORMAL LOW (ref 13.0–17.0)
Lymphocytes Relative: 22.5 % (ref 12.0–46.0)
Lymphs Abs: 0.9 10*3/uL (ref 0.7–4.0)
MCHC: 32.5 g/dL (ref 30.0–36.0)
MCV: 77.6 fl — ABNORMAL LOW (ref 78.0–100.0)
Monocytes Absolute: 0.5 10*3/uL (ref 0.1–1.0)
Monocytes Relative: 11.9 % (ref 3.0–12.0)
Neutro Abs: 2.3 10*3/uL (ref 1.4–7.7)
Neutrophils Relative %: 57.6 % (ref 43.0–77.0)
Platelets: 340 10*3/uL (ref 150.0–400.0)
RBC: 4.55 Mil/uL (ref 4.22–5.81)
RDW: 16.5 % — ABNORMAL HIGH (ref 11.5–15.5)
WBC: 3.9 10*3/uL — ABNORMAL LOW (ref 4.0–10.5)

## 2022-06-16 LAB — HEMOGLOBIN A1C: Hgb A1c MFr Bld: 5.9 % (ref 4.6–6.5)

## 2022-06-16 NOTE — Assessment & Plan Note (Signed)
Chronic CMP Encouraged regular exercise, healthy diet and weight loss Stressed alcohol cessation Discussed increased risk of liver cirrhosis

## 2022-06-16 NOTE — Assessment & Plan Note (Signed)
Chronic GERD controlled Continue pantoprazole 40 mg daily 

## 2022-06-16 NOTE — Assessment & Plan Note (Signed)
Stressed alcohol cessation Reviewed liver steatosis and increased risk of liver cirrhosis

## 2022-06-16 NOTE — Assessment & Plan Note (Signed)
New Mild Notices it at the end of the day and resolves overnight He does eat out a lot so is likely consuming too much sodium Sits and stands intermittently throughout the day No regular exercise As noticed some shortness of breath when he runs around the bases when he plays baseball, which she never used to have, but no day-to-day shortness of breath ?  Venous insufficiency, related to sodium versus other Has known pulmonary nodules and possible sarcoidosis, but never saw pulmonary We will get follow-up CT scan of chest We will get echocardiogram CBC, CMP Advise decreasing salt intake, working on regular exercise

## 2022-06-16 NOTE — Assessment & Plan Note (Signed)
Male who Has had some shortness of breath with exertion-strenuous exertion He does not exercise regularly so could be partially deconditioning Does have pulmonary nodules and suspected sarcoidosis Has fatty liver and risk factors for cardiac causes CT of the lungs to follow-up nodules Echocardiogram CBC, CMP Further evaluation/treatment depending on above

## 2022-06-16 NOTE — Assessment & Plan Note (Signed)
Chronic Regular exercise and healthy diet encouraged/decrease alcohol Check lipid panel  Continue fenofibrate 145 mg daily

## 2022-06-16 NOTE — Assessment & Plan Note (Signed)
Chronic Blood pressure well controlled CMP Continue amlodipine 10 mg daily, Diovan-HCTZ 320-25 mg daily

## 2022-06-16 NOTE — Assessment & Plan Note (Signed)
Chronic Check a1c Low sugar / carb diet Stressed regular exercise  

## 2022-06-16 NOTE — Assessment & Plan Note (Signed)
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

## 2022-06-18 NOTE — Addendum Note (Signed)
Addended by: [name] on: 06/18/2022 01:16 PM   Modules accepted: Orders

## 2022-06-19 ENCOUNTER — Encounter: Payer: Self-pay | Admitting: Internal Medicine

## 2022-06-19 DIAGNOSIS — D509 Iron deficiency anemia, unspecified: Secondary | ICD-10-CM

## 2022-07-04 ENCOUNTER — Other Ambulatory Visit (INDEPENDENT_AMBULATORY_CARE_PROVIDER_SITE_OTHER): Payer: 59

## 2022-07-04 DIAGNOSIS — D649 Anemia, unspecified: Secondary | ICD-10-CM | POA: Diagnosis not present

## 2022-07-04 LAB — CBC WITH DIFFERENTIAL/PLATELET
Basophils Absolute: 0 10*3/uL (ref 0.0–0.1)
Basophils Relative: 0.9 % (ref 0.0–3.0)
Eosinophils Absolute: 0.3 10*3/uL (ref 0.0–0.7)
Eosinophils Relative: 7.6 % — ABNORMAL HIGH (ref 0.0–5.0)
HCT: 34.9 % — ABNORMAL LOW (ref 39.0–52.0)
Hemoglobin: 11.4 g/dL — ABNORMAL LOW (ref 13.0–17.0)
Lymphocytes Relative: 28.5 % (ref 12.0–46.0)
Lymphs Abs: 1.1 10*3/uL (ref 0.7–4.0)
MCHC: 32.8 g/dL (ref 30.0–36.0)
MCV: 78.5 fl (ref 78.0–100.0)
Monocytes Absolute: 0.5 10*3/uL (ref 0.1–1.0)
Monocytes Relative: 13.9 % — ABNORMAL HIGH (ref 3.0–12.0)
Neutro Abs: 1.8 10*3/uL (ref 1.4–7.7)
Neutrophils Relative %: 49.1 % (ref 43.0–77.0)
Platelets: 290 10*3/uL (ref 150.0–400.0)
RBC: 4.44 Mil/uL (ref 4.22–5.81)
RDW: 17 % — ABNORMAL HIGH (ref 11.5–15.5)
WBC: 3.7 10*3/uL — ABNORMAL LOW (ref 4.0–10.5)

## 2022-07-04 LAB — IBC PANEL
Iron: 55 ug/dL (ref 42–165)
Saturation Ratios: 9.5 % — ABNORMAL LOW (ref 20.0–50.0)
TIBC: 581 ug/dL — ABNORMAL HIGH (ref 250.0–450.0)
Transferrin: 415 mg/dL — ABNORMAL HIGH (ref 212.0–360.0)

## 2022-07-04 LAB — VITAMIN B12: Vitamin B-12: 539 pg/mL (ref 211–911)

## 2022-07-04 LAB — FERRITIN: Ferritin: 9.8 ng/mL — ABNORMAL LOW (ref 22.0–322.0)

## 2022-07-06 ENCOUNTER — Telehealth (HOSPITAL_BASED_OUTPATIENT_CLINIC_OR_DEPARTMENT_OTHER): Payer: Self-pay | Admitting: Internal Medicine

## 2022-07-06 NOTE — Telephone Encounter (Signed)
Left message for patient to call and schedule Echocardiogram ordered by Dr. Billey Gosling

## 2022-07-07 NOTE — Addendum Note (Signed)
Addended by: [name] on: 07/07/2022 10:19 PM   Modules accepted: Orders

## 2022-07-10 ENCOUNTER — Ambulatory Visit (INDEPENDENT_AMBULATORY_CARE_PROVIDER_SITE_OTHER): Payer: 59

## 2022-07-10 DIAGNOSIS — R0609 Other forms of dyspnea: Secondary | ICD-10-CM

## 2022-07-10 LAB — ECHOCARDIOGRAM COMPLETE
Area-P 1/2: 3.6 cm2
S' Lateral: 3.51 cm

## 2022-07-12 ENCOUNTER — Encounter: Payer: Self-pay | Admitting: Internal Medicine

## 2022-07-12 DIAGNOSIS — I5189 Other ill-defined heart diseases: Secondary | ICD-10-CM | POA: Insufficient documentation

## 2022-07-14 ENCOUNTER — Ambulatory Visit
Admission: RE | Admit: 2022-07-14 | Discharge: 2022-07-14 | Disposition: A | Discharge status: Home / Self Care | Payer: 59 | Source: Ambulatory Visit | Attending: Internal Medicine | Admitting: Internal Medicine

## 2022-07-14 DIAGNOSIS — R918 Other nonspecific abnormal finding of lung field: Secondary | ICD-10-CM

## 2022-07-14 DIAGNOSIS — R0609 Other forms of dyspnea: Secondary | ICD-10-CM

## 2022-07-20 NOTE — Addendum Note (Signed)
Addended by: [name] on: 07/20/2022 08:42 PM   Modules accepted: Orders

## 2022-07-24 ENCOUNTER — Telehealth: Payer: Self-pay | Admitting: Physician Assistant

## 2022-07-24 NOTE — Telephone Encounter (Signed)
Scheduled appt per 8/24 referral. Pt is aware of appt date and time. Pt is aware to arrive 15 mins prior to appt time and to bring and updated insurance card. Pt is aware of appt location.   

## 2022-08-13 NOTE — Progress Notes (Unsigned)
[facility] [phone]   [phone]  CONSULT NOTE  REFERRING PHYSICIAN: Dr. [name]  REASON FOR CONSULTATION:  Iron deficiency anemia  HPI Christopher [name] is a 46 y.o. male with a past medical history significant for hypertension, hyperlipidemia, hepatic steatosis, IBS?,  GERD, alcohol abuse, erectile dysfunction, and possible sarcoidosis is referred to the clinic for anemia.  The patient recently had a follow-up visit with his PCP.  He had routine lab work performed on 07/04/2022 which showed slightly low total white blood cell count at 3.7.  Mild anemia with a hemoglobin 11.4.  MCV in the low end of normal at 78.5, low ferritin at 8.9, borderline low iron at 55, elevated trans ferritin, elevated TIBC at 581, and low saturation ratio at 9.5%.  The patient's vitamin B12 was within normal limits.  Patient was referred to the clinic for further evaluation and recommendations regarding this finding.  Of note, the patient is scheduled to see gastroenterology next week for consideration of colonoscopy given his anemia.  He has an appointment on 06/22/2022.  To the patient's knowledge, the iron deficiency anemia is ***.  From reviewing old records, I have record of his CBCs dating back to 2012. The patient has had intermittent anemia dating back to that time. The lowest Hbg I see is 10.5. Typically his Hbg ranges from 10.5-Normal.. fHe only started taking iron supplements ***. Tolerate? Type bought???   The patient denies any other known vitamin deficiencies.  Denies any history of bariatric surgery.  He is not on any blood thinners.  He is not a vegan or vegetarian, although ***s.  He does not crave ice chips.  Denies any history of CKD.   Denies any changes in her bowel habits or unexplained weight loss.  Denies any abdominal pain.   Regardig symptoms of anemia, the patient does not feel particularly fatigued.  ***dyspnea on exertion or lightheadedness.  Denies  any palpitations.  Denies any other abnormal bleeding or bruising including epistaxis, gingival bleeding, hemoptysis, hematemesis, or melena.    Infections? Autoimmue? Viruses? Herbal or OTC supplements. Alcohol.    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   Pain in lower limb 03/02/2014   Pancreatitis     Past Surgical History:  Procedure Laterality Date   CARPAL TUNNEL RELEASE Right 2008   COLONOSCOPY     IBS   CYST EXCISION Right 2015   "2nd toe"   ESOPHAGOGASTRODUODENOSCOPY     KNEE ARTHROSCOPY Right 1992   LASIK     REFRACTIVE SURGERY Bilateral 2013   SHOULDER SURGERY  10/2016   UPPER GI ENDOSCOPY     chest pain   WRIST FRACTURE SURGERY Right 2008   "got plate and screws"    Family History  Problem Relation Age of Onset   Hypertension Mother    Hyperlipidemia Mother    Depression Mother    Hypertension Father    Alzheimer's disease Maternal Grandmother    Alcohol abuse Maternal Grandfather    Heart disease Maternal Grandfather    Heart failure Maternal Grandfather    Alzheimer's disease Paternal Grandmother    Alcohol abuse Paternal Merchant navy officer  Depression Sister    Heart disease Maternal Uncle    Colon cancer Neg Hx     Social History Social History   Tobacco Use   Smoking status: Never   Smokeless tobacco: Never  Substance Use Topics   Alcohol use: Yes    Comment: 5-6 drinks once a week   Drug use: No    Allergies  Allergen Reactions   Morphine And Related Nausea And Vomiting and Other (See Comments)    Sweats    Current Outpatient Medications  Medication Sig Dispense Refill   amLODipine (NORVASC) 10 MG tablet Take 1 tablet (10 mg total) by mouth daily. 90 tablet 3   fenofibrate (TRICOR)  145 MG tablet TAKE 1 TABLET BY MOUTH  DAILY 90 tablet 3   Multiple Vitamin (MULTIVITAMIN WITH MINERALS) TABS tablet Take 1 tablet by mouth daily.     pantoprazole (PROTONIX) 40 MG tablet Take 1 tablet (40 mg total) by mouth daily. 90 tablet 3   tadalafil (CIALIS) 20 MG tablet Take 20 mg by mouth daily as needed.     valsartan-hydrochlorothiazide (DIOVAN-HCT) 320-25 MG tablet TAKE 1 TABLET BY MOUTH  DAILY 90 tablet 3   No current facility-administered medications for this visit.    REVIEW OF SYSTEMS:   Review of Systems  Constitutional: Negative for appetite change, chills, fatigue, fever and unexpected weight change.  HENT:   Negative for mouth sores, nosebleeds, sore throat and trouble swallowing.   Eyes: Negative for eye problems and icterus.  Respiratory: Negative for cough, hemoptysis, shortness of breath and wheezing.   Cardiovascular: Negative for chest pain and leg swelling.  Gastrointestinal: Negative for abdominal pain, constipation, diarrhea, nausea and vomiting.  Genitourinary: Negative for bladder incontinence, difficulty urinating, dysuria, frequency and hematuria.   Musculoskeletal: Negative for back pain, gait problem, neck pain and neck stiffness.  Skin: Negative for itching and rash.  Neurological: Negative for dizziness, extremity weakness, gait problem, headaches, light-headedness and seizures.  Hematological: Negative for adenopathy. Does not bruise/bleed easily.  Psychiatric/Behavioral: Negative for confusion, depression and sleep disturbance. The patient is not nervous/anxious.     PHYSICAL EXAMINATION:  There were no vitals taken for this visit.  ECOG PERFORMANCE STATUS: {CHL ONC ECOG [id number]  Physical Exam  Constitutional: Oriented to person, place, and time and well-developed, well-nourished, and in no distress. No distress.  HENT:  Head: Normocephalic and atraumatic.  Mouth/Throat: Oropharynx is clear and moist. No oropharyngeal exudate.  Eyes:  Conjunctivae are normal. Right eye exhibits no discharge. Left eye exhibits no discharge. No scleral icterus.  Neck: Normal range of motion. Neck supple.  Cardiovascular: Normal rate, regular rhythm, normal heart sounds and intact distal pulses.   Pulmonary/Chest: Effort normal and breath sounds normal. No respiratory distress. No wheezes. No rales.  Abdominal: Soft. Bowel sounds are normal. Exhibits no distension and no mass. There is no tenderness.  Musculoskeletal: Normal range of motion. Exhibits no edema.  Lymphadenopathy:    No cervical adenopathy.  Neurological: Alert and oriented to person, place, and time. Exhibits normal muscle tone. Gait normal. Coordination normal.  Skin: Skin is warm and dry. No rash noted. Not diaphoretic. No erythema. No pallor.  Psychiatric: Mood, memory and judgment normal.  Vitals reviewed.  LABORATORY DATA: Lab Results  Component Value Date   WBC 3.7 (L) 07/04/2022   HGB 11.4 (L) 07/04/2022   HCT 34.9 (L) 07/04/2022   MCV 78.5 07/04/2022   PLT 290.0 07/04/2022      Chemistry  Component Value Date/Time   NA 141 06/16/2022 0900   NA 139 07/08/2020 0938   K 3.7 06/16/2022 0900   CL 104 06/16/2022 0900   CO2 27 06/16/2022 0900   BUN 15 06/16/2022 0900   BUN 11 07/08/2020 0938   CREATININE 1.08 06/16/2022 0900      Component Value Date/Time   CALCIUM 9.4 06/16/2022 0900   ALKPHOS 42 06/16/2022 0900   AST 35 06/16/2022 0900   ALT 50 06/16/2022 0900   BILITOT 0.6 06/16/2022 0900       RADIOGRAPHIC STUDIES: No results found.  ASSESSMENT: This is a very pleasant 46 year old male referred to the clinic for anemia.  The patient was seen with Dr. Julien Nordmann today.  The patient had several labs performed including CBC, CMP, iron studies, ferritin, folate, and SPEP with immunofixation.  Viral labs?  Dr. Julien Nordmann recommends sending a prescription for Integra plus p.o. daily.  The patient was encouraged to take this with vitamin C to help with  iron absorption.  The patient was also given a handout of iron rich food and encouraged to increase his dietary intake of iron rich food.  The patient is scheduled to see GI next week.  Encouraged him to follow-up with them regarding routine colonoscopy and to rule out GI blood loss.  Iron infusion?  We will see him back for follow-up in 3 months for evaluation and repeat blood work.  The patient was advised to call immediately if he has any concerning symptoms in the interval. The patient voices understanding of current disease status and treatment options and is in agreement with the current care plan. All questions were answered. The patient knows to call the clinic with any problems, questions or concerns. We can certainly see the patient much sooner if necessary   The patient voices understanding of current disease status and treatment options and is in agreement with the current care plan.  All questions were answered. The patient knows to call the clinic with any problems, questions or concerns. We can certainly see the patient much sooner if necessary.  Thank you so much for allowing me to participate in the care of [facility]. I will continue to follow up the patient with you and assist in his care.  I spent {CHL ONC TIME VISIT - NLZJQ:[id number]} counseling the patient face to face. The total time spent in the appointment was {CHL ONC TIME VISIT - [id number]:[id number]}.  Disclaimer: This note was dictated with voice recognition software. Similar sounding words can inadvertently be transcribed and may not be corrected upon review.   Kaitlen Redford L Carey Johndrow August 13, 2022, 5:43 PM

## 2022-08-14 ENCOUNTER — Other Ambulatory Visit: Payer: Self-pay

## 2022-08-14 DIAGNOSIS — D649 Anemia, unspecified: Secondary | ICD-10-CM

## 2022-08-15 ENCOUNTER — Encounter: Payer: Self-pay | Admitting: Physician Assistant

## 2022-08-15 ENCOUNTER — Inpatient Hospital Stay: Payer: 59 | Attending: Physician Assistant | Admitting: Physician Assistant

## 2022-08-15 ENCOUNTER — Other Ambulatory Visit: Payer: Self-pay

## 2022-08-15 ENCOUNTER — Inpatient Hospital Stay: Payer: 59

## 2022-08-15 DIAGNOSIS — F1090 Alcohol use, unspecified, uncomplicated: Secondary | ICD-10-CM

## 2022-08-15 DIAGNOSIS — D649 Anemia, unspecified: Secondary | ICD-10-CM

## 2022-08-15 DIAGNOSIS — Z800 Family history of malignant neoplasm of digestive organs: Secondary | ICD-10-CM

## 2022-08-15 DIAGNOSIS — I10 Essential (primary) hypertension: Secondary | ICD-10-CM | POA: Diagnosis not present

## 2022-08-15 DIAGNOSIS — D509 Iron deficiency anemia, unspecified: Secondary | ICD-10-CM | POA: Diagnosis present

## 2022-08-15 DIAGNOSIS — K219 Gastro-esophageal reflux disease without esophagitis: Secondary | ICD-10-CM

## 2022-08-15 DIAGNOSIS — K760 Fatty (change of) liver, not elsewhere classified: Secondary | ICD-10-CM

## 2022-08-15 DIAGNOSIS — K589 Irritable bowel syndrome without diarrhea: Secondary | ICD-10-CM

## 2022-08-15 DIAGNOSIS — E785 Hyperlipidemia, unspecified: Secondary | ICD-10-CM

## 2022-08-15 LAB — IRON AND IRON BINDING CAPACITY (CC-WL,HP ONLY)
Iron: 54 ug/dL (ref 45–182)
Saturation Ratios: 10 % — ABNORMAL LOW (ref 17.9–39.5)
TIBC: 564 ug/dL — ABNORMAL HIGH (ref 250–450)
UIBC: 510 ug/dL — ABNORMAL HIGH (ref 117–376)

## 2022-08-15 LAB — CMP (CANCER CENTER ONLY)
ALT: 49 U/L — ABNORMAL HIGH (ref 0–44)
AST: 34 U/L (ref 15–41)
Albumin: 4.4 g/dL (ref 3.5–5.0)
Alkaline Phosphatase: 47 U/L (ref 38–126)
Anion gap: 7 (ref 5–15)
BUN: 15 mg/dL (ref 6–20)
CO2: 27 mmol/L (ref 22–32)
Calcium: 9.1 mg/dL (ref 8.9–10.3)
Chloride: 107 mmol/L (ref 98–111)
Creatinine: 1.16 mg/dL (ref 0.61–1.24)
GFR, Estimated: >60 mL/min (ref >60)
Glucose, Bld: 95 mg/dL (ref 70–99)
Potassium: 3.9 mmol/L (ref 3.5–5.1)
Sodium: 141 mmol/L (ref 135–145)
Total Bilirubin: 0.6 mg/dL (ref 0.3–1.2)
Total Protein: 7.5 g/dL (ref 6.5–8.1)

## 2022-08-15 LAB — CBC WITH DIFFERENTIAL (CANCER CENTER ONLY)
Abs Immature Granulocytes: 0.01 10*3/uL (ref 0.00–0.07)
Basophils Absolute: 0 10*3/uL (ref 0.0–0.1)
Basophils Relative: 1 %
Eosinophils Absolute: 0.2 10*3/uL (ref 0.0–0.5)
Eosinophils Relative: 4 %
HCT: 36.7 % — ABNORMAL LOW (ref 39.0–52.0)
Hemoglobin: 11.8 g/dL — ABNORMAL LOW (ref 13.0–17.0)
Immature Granulocytes: 0 %
Lymphocytes Relative: 21 %
Lymphs Abs: 1 10*3/uL (ref 0.7–4.0)
MCH: 26 pg (ref 26.0–34.0)
MCHC: 32.2 g/dL (ref 30.0–36.0)
MCV: 81 fL (ref 80.0–100.0)
Monocytes Absolute: 0.5 10*3/uL (ref 0.1–1.0)
Monocytes Relative: 10 %
Neutro Abs: 3 10*3/uL (ref 1.7–7.7)
Neutrophils Relative %: 64 %
Platelet Count: 329 10*3/uL (ref 150–400)
RBC: 4.53 MIL/uL (ref 4.22–5.81)
RDW: 15.9 % — ABNORMAL HIGH (ref 11.5–15.5)
WBC Count: 4.7 10*3/uL (ref 4.0–10.5)
nRBC: 0 % (ref 0.0–0.2)

## 2022-08-15 LAB — FOLATE: Folate: 19.4 ng/mL (ref >5.9)

## 2022-08-15 LAB — FERRITIN: Ferritin: 9 ng/mL — ABNORMAL LOW (ref 24–336)

## 2022-08-15 MED ORDER — INTEGRA PLUS PO CAPS: 1.0000 | ORAL_CAPSULE | Freq: Every morning | ORAL | 2 refills | Status: DC

## 2022-08-16 ENCOUNTER — Telehealth: Payer: Self-pay | Admitting: Pharmacy Technician

## 2022-08-16 NOTE — Telephone Encounter (Signed)
Dr. Toya Smothers,  Auth Submission: NO AUTH NEEDED Payer: [employer] Medication & CPT/J Code(s) submitted: Venofer (Iron Sucrose) J1756 Route of submission (phone, fax, portal):  Phone # Fax # Auth type: Buy/Bill Units/visits requested: x3 doses Reference number:  Approval from: 08/16/22 to 11/26/22   Patient will be scheduled as soon as possible

## 2022-08-22 NOTE — Progress Notes (Unsigned)
08/22/2022 Christopher [name] [id number] [birth year]   Chief Complaint:  History of Present Illness: Christopher [name] is a 46 year old male with a past medical history of hypertension, hyperlipidemia, pulmonary nodules, iron deficiency anemia since 2004, possible sarcoidosis, alcohol use disorder, pancreatitis, granulomatis hepatitis, GERD and irritable bowel syndrome. He is known by Dr. Carlean Purl.   daughter has congenital spherocytosis.  maternal grandfather had heart failure.  paternal grandfather had esophageal cancer.  He was seen by hematologist Dr. Julien Nordmann 08/15/2022, Venofer '300mg'$  IV weekly x 3 ordered.   Hg 10.8 Aprill 2012  Liver biopsy 01/04/2021: A. LIVER, BIOPSY:  - Numerous well-formed nonnecrotizing granulomas, consistent with  granulomatous hepatitis, see comment  - Mild steatosis  - No evidence of advanced fibrosis/ cirrhosis  COMMENT:  Sections show liver parenchyma with portal and lobular non-caseating  epithelioid granulomas. There is mild lymphocytic inflammation of the  portal tracts without interface hepatitis. Native bile ducts and portal  vascular structures are unremarkable. Mild ductular reaction is present.  The lobules show mild steatosis without definite evidence of  steatohepatitis.  Scattered foci of lobular necroinflammation are seen.  Some of the granulomatous inflammation is characterized by large areas  of coalescing granulomas with extensive hyalinization, a finding which  is often reported in sarcoidosis. AFB and GMS are negative for  pathogenic organisms.  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        Latest Ref Rng & Units 08/15/2022   12:53 PM 07/04/2022    8:22 AM 06/16/2022    9:00 AM  CBC  WBC 4.0 - 10.5 K/uL 4.7  3.7  3.9   Hemoglobin 13.0 - 17.0 g/dL 11.8  11.4  11.5   Hematocrit 39.0 - 52.0 % 36.7  34.9  35.3   Platelets 150 - 400 K/uL  329  290.0  340.0        Latest Ref Rng & Units 08/15/2022   12:53 PM 06/16/2022    9:00 AM 12/16/2021    9:17 AM  CMP  Glucose 70 - 99 mg/dL 95  96  93   BUN 6 - 20 mg/dL '15  15  15   '$ Creatinine 0.61 - 1.24 mg/dL 1.16  1.08  1.19   Sodium 135 - 145 mmol/L 141  141  139   Potassium 3.5 - 5.1 mmol/L 3.9  3.7  3.8   Chloride 98 - 111 mmol/L 107  104  102   CO2 22 - 32 mmol/L '27  27  30   '$ Calcium 8.9 - 10.3 mg/dL 9.1  9.4  9.0   Total Protein 6.5 - 8.1 g/dL 7.5  7.4  6.9   Total Bilirubin 0.3 - 1.2 mg/dL 0.6  0.6  0.5   Alkaline Phos 38 - 126 U/L 47  42  34   AST 15 - 41 U/L 34  35  33   ALT 0 - 44 U/L 49  50  42     ECHO 07/10/2022: Left ventricular ejection fraction by 3D volume is 55 %. The left ventricle has normal function. The left ventricle has no regional wall motion abnormalities. There is mild left ventricular hypertrophy. Left ventricular diastolic parameters are consistent with Grade II diastolic dysfunction (pseudonormalization). The average left ventricular global longitudinal strain is -20.1 %. The global longitudinal strain is normal. 1. Right ventricular systolic function is normal. The right ventricular size is mildly enlarged. 2. The  mitral valve is normal in structure. No evidence of mitral valve regurgitation. No evidence of mitral stenosis. 3. The aortic valve is normal in structure. Aortic valve regurgitation is not visualized. No aortic stenosis is present. 4. The inferior vena cava is normal in size with greater than 50% respiratory variability, suggesting right atrial pressure of 3 mmHg.  Chests CT 07/17/2022: 1. The previous pulmonary nodules have all decreased in size or resolved. This is consistent with benign process. No further nodule follow-up is needed. This recommendation follows the Fleischner society guidelines. 2. No acute intrathoracic abnormality. 3. Hepatic steatosis.    Abdominal MRI 12/23/2020: Lower chest: No acute findings.    Hepatobiliary: Moderate diffuse hepatic steatosis is seen. There are innumerable tiny sub-cm hypovascular lesions throughout both the right and left lobes in a miliary pattern which show delayed enhancement. In addition, there are other numerous ill-defined areas hyperenhancement which could be due to fibrosis. No gross morphologic features of cirrhosis are seen however. Gallbladder is unremarkable. No evidence of biliary ductal dilatation.   Pancreas:  No mass or inflammatory changes.   Spleen: No evidence of splenomegaly. Several small splenic lesions are seen measuring up to 1.2 cm. These show T2 hypointensity and delayed contrast enhancement, similar to the liver lesions described above.   Adrenals/Urinary Tract: No masses identified. Several small renal cysts noted bilaterally. No evidence of hydronephrosis.   Stomach/Bowel: Visualized portion unremarkable.   Vascular/Lymphatic: No pathologically enlarged lymph nodes identified. No abdominal aortic aneurysm.   Other:  None.   Musculoskeletal:  No suspicious bone lesions identified.   IMPRESSION: Diffuse hepatic steatosis.   Diffuse miliary pattern of disease throughout the liver, and possible early fibrotic changes. Similar but fewer splenic lesions also seen. Differential diagnosis includes infection, sarcoidosis/granulomatous disease, and less likely metastatic disease. Consider ultrasound-guided liver biopsy for tissue diagnosis.      Colonoscopy 10/26/2014: Normal colonoscopy and terminal ileum.  Colonoscopy 03/03/2003: Normal colonoscopy   EGD 03/03/2003: 2cm hiatal hernia  Path report: No villous atrophy.   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   Review of Systems:   Constitutional: Negative for fever, sweats, chills or weight loss.  Respiratory: Negative for shortness of breath.   Cardiovascular: Negative for chest pain, palpitations and leg swelling.  Gastrointestinal: See HPI.  Musculoskeletal: Negative for back pain or muscle aches.  Neurological: Negative for dizziness, headaches or paresthesias.    Physical Exam: There were no vitals taken for this visit. General: Well  developed, w   ***male in no acute distress. Head: Normocephalic and atraumatic. Eyes: No scleral icterus. Conjunctiva pink . Ears: Normal auditory acuity. Mouth: Dentition intact. No ulcers or lesions.  Lungs: Clear throughout to auscultation. Heart: Regular rate and rhythm, no murmur. Abdomen: Soft, nontender and nondistended. No masses or hepatomegaly. Normal bowel sounds x 4 quadrants.  Rectal: *** Musculoskeletal: Symmetrical with no gross deformities. Extremities: No edema. Neurological: Alert oriented x 4. No focal deficits.  Psychological: Alert and cooperative. Normal mood and affect  Assessment and Recommendations:  68) 46 year old male with chronic iron deficiency anemia. Normal colonoscopy in 2004 and 2015. EGD in 2004 showed a 2 cm hiatal hernia without Cameron lesions,  duodenal biopsies negative for celiac disease. No splenomegaly per  abdominal MRI. Daughter with reported history of  congenital spherocytosis. -EGD and colonoscopy  -If EGD/colonoscopy unrevealing will schedule a small bowel capsule endoscopy to rule out small bowel Crohn's disease, small bowel sarcoidosis and neoplastic process  -Peripheral smear to check spherocytes   GERD  Hepatic steatosis   Past history of alcohol use disorder

## 2022-08-23 ENCOUNTER — Ambulatory Visit: Payer: 59 | Admitting: Nurse Practitioner

## 2022-08-23 ENCOUNTER — Encounter: Payer: Self-pay | Admitting: Nurse Practitioner

## 2022-08-23 DIAGNOSIS — K760 Fatty (change of) liver, not elsewhere classified: Secondary | ICD-10-CM | POA: Diagnosis not present

## 2022-08-23 DIAGNOSIS — D509 Iron deficiency anemia, unspecified: Secondary | ICD-10-CM

## 2022-08-23 DIAGNOSIS — K589 Irritable bowel syndrome without diarrhea: Secondary | ICD-10-CM

## 2022-08-23 NOTE — Patient Instructions (Addendum)
If you are age 47 or younger, your body mass index should be between 19-25. Your Body mass index is 31.19 kg/m. If this is out of the aformentioned range listed, please consider follow up with your Primary Care Provider.  ________________________________________________________  The [facility] providers would like to encourage you to use [facility] to communicate with providers for non-urgent requests or questions.  Due to long hold times on the telephone, sending your provider a message by [facility] may be a faster and more efficient way to get a response.  Please allow 48 business hours for a response.  Please remember that this is for non-urgent requests.  _______________________________________________________  Dennis Bast have been scheduled for an endoscopy and colonoscopy. Please follow the written instructions given to you at your visit today. Please pick up your prep supplies at the pharmacy within the next 1-3 days. If you use inhalers (even only as needed), please bring them with you on the day of your procedure.  Due to recent changes in healthcare laws, you may see the results of your imaging and laboratory studies on MyChart before your provider has had a chance to review them.  We understand that in some cases there may be results that are confusing or concerning to you. Not all laboratory results come back in the same time frame and the provider may be waiting for multiple results in order to interpret others.  Please give [place] 48 hours in order for your provider to thoroughly review all the results before contacting the office for clarification of your results.   Reduce bread/pasta/potato/rice in diet to reduce risk of developing fatty liver disease.  Thank you for entrusting me with your care and choosing [facility].  Carl Best, NP

## 2022-08-24 ENCOUNTER — Other Ambulatory Visit: Payer: Self-pay | Admitting: Internal Medicine

## 2022-08-24 ENCOUNTER — Ambulatory Visit (INDEPENDENT_AMBULATORY_CARE_PROVIDER_SITE_OTHER): Payer: 59 | Admitting: *Deleted

## 2022-08-24 VITALS — BP 129/74 | HR 65 | Temp 98.0°F | Resp 20 | Ht 72.0 in | Wt 232.8 lb

## 2022-08-24 DIAGNOSIS — D509 Iron deficiency anemia, unspecified: Secondary | ICD-10-CM | POA: Diagnosis not present

## 2022-08-24 MED ORDER — SODIUM CHLORIDE 0.9 % IV SOLN
300.0000 mg | Freq: Once | INTRAVENOUS | Status: AC
  Administered 2022-08-24: 300 mg via INTRAVENOUS
  Filled 2022-08-24: qty 15

## 2022-08-24 MED ORDER — ACETAMINOPHEN 325 MG PO TABS: 650.0000 mg | ORAL_TABLET | Freq: Once | ORAL | Status: DC

## 2022-08-24 MED ORDER — DIPHENHYDRAMINE HCL 25 MG PO CAPS: 25.0000 mg | ORAL_CAPSULE | Freq: Once | ORAL | Status: DC

## 2022-08-24 NOTE — Progress Notes (Signed)
Diagnosis: Iron Deficiency Anemia  Provider:  Marshell Garfinkel MD  Procedure: Infusion  IV Type: Peripheral, IV Location: L Antecubital  Venofer (Iron Sucrose), Dose: 300 mg  Infusion Start Time: 1339  pm  Infusion Stop Time: 1523  pm  Post Infusion IV Care: Observation period completed and Peripheral IV Discontinued  Discharge: Condition: Good, Destination: Home . AVS provided to patient.   Performed by:  Oren Beckmann, RN

## 2022-09-04 ENCOUNTER — Emergency Department (HOSPITAL_COMMUNITY): Payer: 59

## 2022-09-04 ENCOUNTER — Inpatient Hospital Stay (HOSPITAL_COMMUNITY)
Admission: EM | Admit: 2022-09-04 | Discharge: 2022-09-08 | DRG: 440 | Disposition: A | Discharge status: Home / Self Care | Payer: 59 | Attending: Family Medicine | Admitting: Family Medicine

## 2022-09-04 ENCOUNTER — Encounter (HOSPITAL_COMMUNITY): Payer: Self-pay

## 2022-09-04 ENCOUNTER — Other Ambulatory Visit: Payer: Self-pay

## 2022-09-04 DIAGNOSIS — Z789 Other specified health status: Secondary | ICD-10-CM

## 2022-09-04 DIAGNOSIS — K589 Irritable bowel syndrome without diarrhea: Secondary | ICD-10-CM | POA: Diagnosis present

## 2022-09-04 DIAGNOSIS — E876 Hypokalemia: Secondary | ICD-10-CM | POA: Diagnosis present

## 2022-09-04 DIAGNOSIS — Z8249 Family history of ischemic heart disease and other diseases of the circulatory system: Secondary | ICD-10-CM

## 2022-09-04 DIAGNOSIS — Z6831 Body mass index (BMI) 31.0-31.9, adult: Secondary | ICD-10-CM

## 2022-09-04 DIAGNOSIS — K760 Fatty (change of) liver, not elsewhere classified: Secondary | ICD-10-CM | POA: Diagnosis present

## 2022-09-04 DIAGNOSIS — Z79899 Other long term (current) drug therapy: Secondary | ICD-10-CM

## 2022-09-04 DIAGNOSIS — E785 Hyperlipidemia, unspecified: Secondary | ICD-10-CM | POA: Diagnosis present

## 2022-09-04 DIAGNOSIS — E669 Obesity, unspecified: Secondary | ICD-10-CM | POA: Diagnosis present

## 2022-09-04 DIAGNOSIS — K852 Alcohol induced acute pancreatitis without necrosis or infection: Principal | ICD-10-CM

## 2022-09-04 DIAGNOSIS — K859 Acute pancreatitis without necrosis or infection, unspecified: Secondary | ICD-10-CM | POA: Diagnosis present

## 2022-09-04 DIAGNOSIS — I10 Essential (primary) hypertension: Secondary | ICD-10-CM | POA: Diagnosis present

## 2022-09-04 DIAGNOSIS — K753 Granulomatous hepatitis, not elsewhere classified: Secondary | ICD-10-CM | POA: Diagnosis present

## 2022-09-04 DIAGNOSIS — K219 Gastro-esophageal reflux disease without esophagitis: Secondary | ICD-10-CM | POA: Diagnosis present

## 2022-09-04 DIAGNOSIS — R911 Solitary pulmonary nodule: Secondary | ICD-10-CM | POA: Diagnosis present

## 2022-09-04 DIAGNOSIS — Z20822 Contact with and (suspected) exposure to covid-19: Secondary | ICD-10-CM | POA: Diagnosis present

## 2022-09-04 DIAGNOSIS — Z885 Allergy status to narcotic agent status: Secondary | ICD-10-CM

## 2022-09-04 HISTORY — DX: Acute pancreatitis without necrosis or infection, unspecified: K85.90

## 2022-09-04 LAB — COMPREHENSIVE METABOLIC PANEL
ALT: 71 U/L — ABNORMAL HIGH (ref 0–44)
AST: 58 U/L — ABNORMAL HIGH (ref 15–41)
Albumin: 4.2 g/dL (ref 3.5–5.0)
Alkaline Phosphatase: 39 U/L (ref 38–126)
Anion gap: 7 (ref 5–15)
BUN: 15 mg/dL (ref 6–20)
CO2: 28 mmol/L (ref 22–32)
Calcium: 9 mg/dL (ref 8.9–10.3)
Chloride: 104 mmol/L (ref 98–111)
Creatinine, Ser: 1.09 mg/dL (ref 0.61–1.24)
GFR, Estimated: >60 mL/min (ref >60)
Glucose, Bld: 114 mg/dL — ABNORMAL HIGH (ref 70–99)
Potassium: 3.5 mmol/L (ref 3.5–5.1)
Sodium: 139 mmol/L (ref 135–145)
Total Bilirubin: 0.7 mg/dL (ref 0.3–1.2)
Total Protein: 7.8 g/dL (ref 6.5–8.1)

## 2022-09-04 LAB — CBC WITH DIFFERENTIAL/PLATELET
Abs Immature Granulocytes: 0.05 10*3/uL (ref 0.00–0.07)
Basophils Absolute: 0 10*3/uL (ref 0.0–0.1)
Basophils Relative: 1 %
Eosinophils Absolute: 0.2 10*3/uL (ref 0.0–0.5)
Eosinophils Relative: 3 %
HCT: 39.5 % (ref 39.0–52.0)
Hemoglobin: 12.4 g/dL — ABNORMAL LOW (ref 13.0–17.0)
Immature Granulocytes: 1 %
Lymphocytes Relative: 13 %
Lymphs Abs: 1 10*3/uL (ref 0.7–4.0)
MCH: 26.6 pg (ref 26.0–34.0)
MCHC: 31.4 g/dL (ref 30.0–36.0)
MCV: 84.8 fL (ref 80.0–100.0)
Monocytes Absolute: 0.6 10*3/uL (ref 0.1–1.0)
Monocytes Relative: 8 %
Neutro Abs: 6 10*3/uL (ref 1.7–7.7)
Neutrophils Relative %: 74 %
Platelets: 302 10*3/uL (ref 150–400)
RBC: 4.66 MIL/uL (ref 4.22–5.81)
RDW: 17.3 % — ABNORMAL HIGH (ref 11.5–15.5)
WBC: 7.9 10*3/uL (ref 4.0–10.5)
nRBC: 0 % (ref 0.0–0.2)

## 2022-09-04 LAB — URINALYSIS, ROUTINE W REFLEX MICROSCOPIC
Bilirubin Urine: NEGATIVE
Glucose, UA: NEGATIVE mg/dL
Hgb urine dipstick: NEGATIVE
Ketones, ur: NEGATIVE mg/dL
Leukocytes,Ua: NEGATIVE
Nitrite: NEGATIVE
Protein, ur: NEGATIVE mg/dL
Specific Gravity, Urine: >1.046 — ABNORMAL HIGH (ref 1.005–1.030)
pH: 6 (ref 5.0–8.0)

## 2022-09-04 LAB — LIPASE, BLOOD: Lipase: 1531 U/L — ABNORMAL HIGH (ref 11–51)

## 2022-09-04 MED ORDER — ONDANSETRON HCL 4 MG/2ML IJ SOLN
4.0000 mg | Freq: Once | INTRAMUSCULAR | Status: AC
  Administered 2022-09-04: 4 mg via INTRAVENOUS
  Filled 2022-09-04: qty 2

## 2022-09-04 MED ORDER — LACTATED RINGERS IV SOLN: INTRAVENOUS | Status: AC

## 2022-09-04 MED ORDER — OXYCODONE HCL 5 MG PO TABS
5.0000 mg | ORAL_TABLET | ORAL | Status: DC | PRN
  Administered 2022-09-04 – 2022-09-08 (×18): 5 mg via ORAL
  Filled 2022-09-04 (×18): qty 1

## 2022-09-04 MED ORDER — LACTATED RINGERS IV SOLN: INTRAVENOUS | Status: DC

## 2022-09-04 MED ORDER — FENOFIBRATE 54 MG PO TABS
54.0000 mg | ORAL_TABLET | Freq: Every day | ORAL | Status: DC
  Administered 2022-09-05 – 2022-09-08 (×4): 54 mg via ORAL
  Filled 2022-09-04 (×4): qty 1

## 2022-09-04 MED ORDER — IOHEXOL 300 MG/ML  SOLN
100.0000 mL | Freq: Once | INTRAMUSCULAR | Status: AC | PRN
  Administered 2022-09-04: 100 mL via INTRAVENOUS

## 2022-09-04 MED ORDER — HYDROMORPHONE HCL 1 MG/ML IJ SOLN
0.5000 mg | INTRAMUSCULAR | Status: DC | PRN
  Administered 2022-09-04 – 2022-09-05 (×6): 1 mg via INTRAVENOUS
  Filled 2022-09-04 (×6): qty 1

## 2022-09-04 MED ORDER — SODIUM CHLORIDE 0.9 % IV BOLUS
1000.0000 mL | Freq: Once | INTRAVENOUS | Status: AC
  Administered 2022-09-04: 1000 mL via INTRAVENOUS

## 2022-09-04 MED ORDER — ENOXAPARIN SODIUM 60 MG/0.6ML IJ SOSY
50.0000 mg | PREFILLED_SYRINGE | INTRAMUSCULAR | Status: DC
  Administered 2022-09-04 – 2022-09-07 (×4): 50 mg via SUBCUTANEOUS
  Filled 2022-09-04 (×4): qty 0.6

## 2022-09-04 MED ORDER — ONDANSETRON HCL 4 MG/2ML IJ SOLN
4.0000 mg | Freq: Four times a day (QID) | INTRAMUSCULAR | Status: DC | PRN
  Administered 2022-09-04: 4 mg via INTRAVENOUS
  Filled 2022-09-04: qty 2

## 2022-09-04 MED ORDER — FOLIC ACID 1 MG PO TABS
1.0000 mg | ORAL_TABLET | Freq: Every day | ORAL | Status: DC
  Administered 2022-09-05 – 2022-09-08 (×4): 1 mg via ORAL
  Filled 2022-09-04 (×4): qty 1

## 2022-09-04 MED ORDER — THIAMINE HCL 100 MG/ML IJ SOLN: 100.0000 mg | Freq: Every day | INTRAMUSCULAR | Status: DC

## 2022-09-04 MED ORDER — ONDANSETRON HCL 4 MG PO TABS: 4.0000 mg | ORAL_TABLET | Freq: Four times a day (QID) | ORAL | Status: DC | PRN

## 2022-09-04 MED ORDER — HYDROMORPHONE HCL 1 MG/ML IJ SOLN
1.0000 mg | Freq: Once | INTRAMUSCULAR | Status: AC
  Administered 2022-09-04: 1 mg via INTRAVENOUS
  Filled 2022-09-04: qty 1

## 2022-09-04 MED ORDER — AMLODIPINE BESYLATE 10 MG PO TABS
10.0000 mg | ORAL_TABLET | Freq: Every day | ORAL | Status: DC
  Administered 2022-09-05 – 2022-09-08 (×4): 10 mg via ORAL
  Filled 2022-09-04 (×4): qty 1

## 2022-09-04 MED ORDER — THIAMINE MONONITRATE 100 MG PO TABS
100.0000 mg | ORAL_TABLET | Freq: Every day | ORAL | Status: DC
  Administered 2022-09-05 – 2022-09-08 (×4): 100 mg via ORAL
  Filled 2022-09-04 (×4): qty 1

## 2022-09-04 MED ORDER — PANTOPRAZOLE SODIUM 40 MG IV SOLR
40.0000 mg | INTRAVENOUS | Status: DC
  Administered 2022-09-04 – 2022-09-07 (×4): 40 mg via INTRAVENOUS
  Filled 2022-09-04 (×4): qty 10

## 2022-09-04 MED ORDER — FENTANYL CITRATE PF 50 MCG/ML IJ SOSY
50.0000 ug | PREFILLED_SYRINGE | Freq: Once | INTRAMUSCULAR | Status: AC
  Administered 2022-09-04: 50 ug via INTRAVENOUS
  Filled 2022-09-04: qty 1

## 2022-09-04 NOTE — ED Provider Notes (Signed)
[facility] DEPT Provider Note   CSN: [id number] Arrival date & time: 09/04/22  1509     History  Chief Complaint  Patient presents with   Abdominal Pain    Christopher [name] is a 46 y.o. male.   Abdominal Pain    Patient with history of pancreatitis, hypertension, hyperlipidemia, hepatic steatosis, GERD, anemia presents today due to upper abdominal pain x 1 day.  Started acutely at noon..  Started acutely, its constant and radiates to his back.  It is epigastric and also right upper quadrant.  He is nauseated but denies any vomiting, no change in bowel habits.  He does drink alcohol.  Denies any previous surgeries to his abdomen.  Family history of ACS, no personal history of ACS.  Denies any chest pain or shortness of breath.  Home Medications Prior to Admission medications   Medication Sig Start Date End Date Taking? Authorizing Provider  acetaminophen (TYLENOL) 500 MG tablet Take 1,000 mg by mouth every 6 (six) hours as needed for mild pain or headache.   Yes [provider]  amLODipine (NORVASC) 10 MG tablet Take 1 tablet (10 mg total) by mouth daily. Patient taking differently: Take 10 mg by mouth daily with lunch. 12/16/21  Yes Burns, Claudina Lick, MD  fenofibrate (TRICOR) 145 MG tablet TAKE 1 TABLET BY MOUTH  DAILY Patient taking differently: Take 145 mg by mouth daily with lunch. 08/28/22  Yes Burns, Claudina Lick, MD  ibuprofen (ADVIL) 200 MG tablet Take 400 mg by mouth every 6 (six) hours as needed for mild pain or headache.   Yes [provider]  Multiple Vitamin (MULTIVITAMIN WITH MINERALS) TABS tablet Take 1 tablet by mouth daily.   Yes [provider]  pantoprazole (PROTONIX) 40 MG tablet TAKE 1 TABLET BY MOUTH  DAILY Patient taking differently: Take 40 mg by mouth daily before lunch. 08/28/22  Yes Burns, Claudina Lick, MD  valsartan-hydrochlorothiazide (DIOVAN-HCT) 320-25 MG tablet TAKE 1 TABLET BY MOUTH  DAILY Patient taking  differently: Take 1 tablet by mouth daily with lunch. 10/17/21  Yes Burns, Claudina Lick, MD  FeFum-FePoly-FA-B Cmp-C-Biot (INTEGRA PLUS) CAPS Take 1 capsule by mouth every morning. Patient not taking: Reported on 09/04/2022 08/15/22   Heilingoetter, Cassandra L, PA-C  tadalafil (CIALIS) 20 MG tablet Take 20 mg by mouth daily as needed. 10/15/21   [provider]      Allergies    Morphine and related    Review of Systems   Review of Systems  Gastrointestinal:  Positive for abdominal pain.    Physical Exam Updated Vital Signs BP (!) 157/97   Pulse 87   Temp 97.9 F (36.6 C) (Oral)   Resp 20   Ht 6' (1.829 m)   Wt 104.3 kg   SpO2 94%   BMI 31.19 kg/m  Physical Exam Vitals and nursing note reviewed. Exam conducted with a chaperone present.  Constitutional:      Appearance: Normal appearance.  HENT:     Head: Normocephalic and atraumatic.  Eyes:     General: No scleral icterus.       Right eye: No discharge.        Left eye: No discharge.     Extraocular Movements: Extraocular movements intact.     Pupils: Pupils are equal, round, and reactive to light.  Cardiovascular:     Rate and Rhythm: Normal rate and regular rhythm.     Pulses: Normal pulses.     Heart sounds:  Normal heart sounds. No murmur heard.    No friction rub. No gallop.     Comments: Regular rhythm, upper and lower extremity pulses 2+ symmetric bilaterally Pulmonary:     Effort: Pulmonary effort is normal. No respiratory distress.     Breath sounds: Normal breath sounds.  Abdominal:     General: Abdomen is flat. Bowel sounds are normal. There is no distension.     Palpations: Abdomen is soft.     Tenderness: There is abdominal tenderness in the right upper quadrant and epigastric area. There is guarding.  Skin:    General: Skin is warm and dry.     Coloration: Skin is not jaundiced.  Neurological:     Mental Status: He is alert. Mental status is at baseline.     Coordination: Coordination normal.      ED Results / Procedures / Treatments   Labs (all labs ordered are listed, but only abnormal results are displayed) Labs Reviewed  CBC WITH DIFFERENTIAL/PLATELET - Abnormal; Notable for the following components:      Result Value   Hemoglobin 12.4 (*)    RDW 17.3 (*)    All other components within normal limits  COMPREHENSIVE METABOLIC PANEL - Abnormal; Notable for the following components:   Glucose, Bld 114 (*)    AST 58 (*)    ALT 71 (*)    All other components within normal limits  LIPASE, BLOOD - Abnormal; Notable for the following components:   Lipase 1,531 (*)    All other components within normal limits  URINALYSIS, ROUTINE W REFLEX MICROSCOPIC    EKG EKG Interpretation  Date/Time:  Monday September 04 2022 15:53:06 EDT Ventricular Rate:  77 PR Interval:  178 QRS Duration: 87 QT Interval:  389 QTC Calculation: 441 R Axis:   -7 Text Interpretation: Sinus rhythm Low voltage, precordial leads Confirmed by Dene Gentry [phone]) on 09/04/2022 3:54:50 PM  Radiology CT ABDOMEN PELVIS W CONTRAST  Result Date: 09/04/2022 CLINICAL DATA:  Nausea and vomiting. EXAM: CT ABDOMEN AND PELVIS WITH CONTRAST TECHNIQUE: Multidetector CT imaging of the abdomen and pelvis was performed using the standard protocol following bolus administration of intravenous contrast. RADIATION DOSE REDUCTION: This exam was performed according to the departmental dose-optimization program which includes automated exposure control, adjustment of the mA and/or kV according to patient size and/or use of iterative reconstruction technique. CONTRAST:  166m OMNIPAQUE IOHEXOL 300 MG/ML  SOLN COMPARISON:  MRI examination dated December 23, 2020 FINDINGS: Lower chest: Bibasilar dependent atelectasis.  No acute abnormality. Hepatobiliary: Low attenuation of hepatic parenchyma concerning for hepatic steatosis. No focal liver abnormality is seen. No gallstones, gallbladder wall thickening, or biliary dilatation.  Pancreas: Edema of the head of the pancreas and surrounding inflammatory changes concerning for acute pancreatitis. No peripancreatic fluid collection. Spleen: Normal in size without focal abnormality. Adrenals/Urinary Tract: Adrenal glands are unremarkable. Simple parapelvic cyst in the right kidney. Kidneys are normal, without renal calculi, focal lesion, or hydronephrosis. Bladder is unremarkable. Stomach/Bowel: Stomach is within normal limits. Appendix appears normal. No evidence of bowel wall thickening, distention, or inflammatory changes. Vascular/Lymphatic: No significant vascular findings are present. No enlarged abdominal or pelvic lymph nodes. Reproductive: Prostate is unremarkable. Other: Fat containing left inguinal hernia. No abdominopelvic ascites. Musculoskeletal: Mild multilevel degenerate disc disease. No acute osseous abnormality. IMPRESSION: 1. Edema of the head of the pancreas with surrounding inflammatory changes concerning for acute pancreatitis. No peripancreatic fluid collection. 2. Hepatic steatosis. 3. No evidence of bowel obstruction or bowel  wall thickening. Normal appendix. 4. Fat containing left inguinal hernia. Electronically Signed   By: Keane Police D.O.   On: 09/04/2022 17:22    Procedures Procedures    Medications Ordered in ED Medications  ondansetron (ZOFRAN) injection 4 mg (4 mg Intravenous Given 09/04/22 1546)  fentaNYL (SUBLIMAZE) injection 50 mcg (50 mcg Intravenous Given 09/04/22 1547)  HYDROmorphone (DILAUDID) injection 1 mg (1 mg Intravenous Given 09/04/22 1637)  iohexol (OMNIPAQUE) 300 MG/ML solution 100 mL (100 mLs Intravenous Contrast Given 09/04/22 1649)  sodium chloride 0.9 % bolus 1,000 mL (1,000 mLs Intravenous New Bag/Given 09/04/22 1739)    ED Course/ Medical Decision Making/ A&P                           Medical Decision Making Amount and/or Complexity of Data Reviewed Labs: ordered. Radiology: ordered.  Risk Prescription drug  management. Decision regarding hospitalization.   Patient presents due to epigastric pain.  Differential includes but not limited to pancreatitis, gastritis, gastric ulcer, cholecystitis, atypical ACS, dissection, AKI, SBO.  On exam patient is epigastric numbness and voluntary guarding.  Some mild right upper quadrant tenderness but no Murphy sign.  Upper and lower extremity pulses are symmetric bilaterally, afebrile and does not appear septic on exam.  Regular rhythm and lungs are clear to auscultation bilaterally. -BP (!) 147/98 (BP Location: Left Arm)   Pulse 86   Temp 97.9 F (36.6 C) (Oral)   Resp 16   Ht 6' (1.829 m)   Wt 104.3 kg   SpO2 98%   BMI 31.19 kg/m   I reviewed external medical records, patient is followed by gastroenterology for IBS.  History of pancreatitis in the setting of alcohol use.  I ordered, viewed and interpreted laboratory work-up. CBC without leukocytosis, stable anemia with hemoglobin of 12.4. -CMP shows slight elevation of AST and ALT, no gross electrolyte derangement or AKI. -Lipase is elevated at 1531.  Clinical picture is concerning for pancreatitis.  I ordered, viewed and interpreted CT abdomen with pelvis, agree with radiologist interpretation.  Patient has acute pancreatitis.  I reviewed patient's medication list.  I ordered a liter fluid bolus, fentanyl, Dilaudid, Zofran.  Patient's pain has improved but is still pretty severe.  I considered sending patient home but given he is having acute pancreatitis, severe pain I do think he would benefit from admission.  I will consult hospitalist.        Final Clinical Impression(s) / ED Diagnoses Final diagnoses:  Alcohol-induced acute pancreatitis, unspecified complication status    Rx / DC Orders ED Discharge Orders     None         Sherrill Raring, PA-C 09/04/22 1752    Valarie Merino, MD 09/04/22 2242

## 2022-09-04 NOTE — ED Triage Notes (Signed)
Patient c/o RUQ pain that radiates into the back. Patient states pain started around noon today. Patient also c/o nausea since noon. Patient reports a history of pancreatitis.

## 2022-09-04 NOTE — H&P (Signed)
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  In the ED blood pressure 140s to 150s, afebrile saturating 93 to 98% on room air heart rate in the 70s, Lipase 1531, AST/ALT 58/71, total bili normal at 0.7, CBC fairly stable. CT abdomen pelvis with contrast acute pancreatitis without peripancreatic fluid collection, hepatic steatosis, no bowel obstruction, fat-containing left inguinal hernia. Patient was given IV opiates for pain control, 1 L bolus and admission was requested Patient otherwise denies any chest pain, shortness of breath, fever, chills, headache, focal weakness, numbness tingling, speech difficulties , He had regular bowel movements. His girlfriend is at bedside.  Assessment/Plan Active Problems:   Acute pancreatitis  Acute pancreatitis most likely alcohol induced like his previous episodd: Has previous history of pericarditis.  We will admit the patient manage converted with aggressive IV fluid hydration, pain control with oral/IV opiates, antiemetic, PPI, trend LFTs, clear liquid diet and advance as tolerated  Recent alcohol use holding close, add CIWA scale Ativan, monitor for withdrawal, add thiamine folate  Mild transaminitis suspect in the setting of above, trend  LFTs.  Hypertension HyperLipidemia: BP stable hold home meds, resume antihypertensive and Tricor slowly  History of iron-deficiency anemia hemoglobin stable History of granulomatous hepatitis/hepatic steatosis/possible sarcoidosis he is followed by [facility] Pulmonary nodule decreased in size based on recent CT 8/18 no further nodule follow-up  wasadvised Class I obesity BMI 31 will benefit with weight loss healthy lifestyle as outpatient. Body mass index is 31.19 kg/m.   Severity of Illness: The appropriate patient status for this patient is OBSERVATION. Observation status is judged to be reasonable and necessary in order to provide the required intensity of service to ensure the patient's safety. The patient's presenting symptoms, physical exam findings, and initial radiographic and laboratory data in the context of their medical condition is felt to place them at decreased risk for further clinical deterioration. Furthermore, it is anticipated that the patient will be medically stable for discharge from the hospital within 2 midnights of admission.    DVT prophylaxis: enoxaparin (LOVENOX) injection 40 mg Start: 09/04/22 1800 Code Status:   Code Status: Full Code  Family Communication: Admission, patients condition and plan of care including tests being ordered have been discussed with the patient who indicate understanding and agree with the plan and Code Status.  Consults called:  None  Review of Systems: All systems were reviewed and were negative except as mentioned in HPI above. Negative for fever Negative for chest pain Negative for shortness of breath  Past Medical History:  Diagnosis Date   Anemia    GERD (gastroesophageal reflux disease)    H/O hiatal hernia    Hepatic steatosis    Hyperlipidemia    Hypertension    IBS (irritable bowel syndrome)    Kidney cyst, acquired    Migraine    "probably monthly" (07/22/2014)   Myxoid  cyst 03/02/2014   Pain in lower limb  03/02/2014   Pancreatitis     Past Surgical History:  Procedure Laterality Date   CARPAL TUNNEL RELEASE Right 2008   COLONOSCOPY     IBS   CYST EXCISION Right 2015   "2nd toe"   ESOPHAGOGASTRODUODENOSCOPY     KNEE ARTHROSCOPY Right 1992   LASIK     REFRACTIVE SURGERY Bilateral 2013   SHOULDER SURGERY  10/2016   UPPER GI ENDOSCOPY     chest pain   WRIST FRACTURE SURGERY Right 2008   "got plate and screws"     reports that he has never smoked. He has never used smokeless tobacco. He reports current alcohol use. He reports that he does not use drugs.  Allergies  Allergen Reactions   Morphine And Related Nausea And Vomiting and Other (See Comments)    Sweats, also    Family History  Problem Relation Age of Onset   Hypertension Mother    Hyperlipidemia Mother    Depression Mother    Hypertension Father    Alzheimer's disease Maternal Grandmother    Alcohol abuse Maternal Grandfather    Heart disease Maternal Grandfather    Heart failure Maternal Grandfather    Alzheimer's disease Paternal Grandmother    Alcohol abuse Paternal Grandfather    Depression Sister    Heart disease Maternal Uncle    Colon cancer Neg Hx      Prior to Admission medications   Medication Sig Start Date End Date Taking? Authorizing Provider  acetaminophen (TYLENOL) 500 MG tablet Take 1,000 mg by mouth every 6 (six) hours as needed for mild pain or headache.   Yes [provider]  amLODipine (NORVASC) 10 MG tablet Take 1 tablet (10 mg total) by mouth daily. Patient taking differently: Take 10 mg by mouth daily with lunch. 12/16/21  Yes Burns, Claudina Lick, MD  fenofibrate (TRICOR) 145 MG tablet TAKE 1 TABLET BY MOUTH  DAILY Patient taking differently: Take 145 mg by mouth daily with lunch. 08/28/22  Yes Burns, Claudina Lick, MD  ibuprofen (ADVIL) 200 MG tablet Take 400 mg by mouth every 6 (six) hours as needed for mild pain or headache.   Yes [provider]  Multiple Vitamin (MULTIVITAMIN  WITH MINERALS) TABS tablet Take 1 tablet by mouth daily.   Yes [provider]  pantoprazole (PROTONIX) 40 MG tablet TAKE 1 TABLET BY MOUTH  DAILY Patient taking differently: Take 40 mg by mouth daily before lunch. 08/28/22  Yes Burns, Claudina Lick, MD  valsartan-hydrochlorothiazide (DIOVAN-HCT) 320-25 MG tablet TAKE 1 TABLET BY MOUTH  DAILY Patient taking differently: Take 1 tablet by mouth daily with lunch. 10/17/21  Yes Burns, Claudina Lick, MD  FeFum-FePoly-FA-B Cmp-C-Biot (INTEGRA PLUS) CAPS Take 1 capsule by mouth every morning. Patient not taking: Reported on 09/04/2022 08/15/22   Heilingoetter, Cassandra L, PA-C  tadalafil (CIALIS) 20 MG tablet Take 20 mg by mouth daily as needed. 10/15/21   [provider]    Physical Exam: Vitals:   09/04/22 1600 09/04/22 1630 09/04/22 1700 09/04/22 1730  BP: (!) 147/90 (!) 148/96 (!) 157/97 139/86  Pulse: 72 76 87 83  Resp: (!) '21 18 20 20  '$ Temp:      TempSrc:      SpO2: 93% 98% 94% 95%  Weight:      Height:        General exam: AAOx3 , NAD, weak appearing. HEENT:Oral mucosa moist, Ear/Nose WNL grossly, dentition normal. Respiratory system:  bilaterally clear,no wheezing or crackles,no use of accessory muscle Cardiovascular system: S1 & S2 +, No JVD,. Gastrointestinal system: Abdomen soft, Tender in mid abdomen,ND, BS+ Nervous System:Alert, awake, moving extremities and grossly nonfocal Extremities: No edema, distal peripheral pulses palpable.  Skin: No rashes,no icterus. MSK: Normal muscle bulk,tone, power   Labs on Admission: I have personally reviewed following labs and imaging studies  CBC: Recent Labs  Lab 09/04/22 1512  WBC 7.9  NEUTROABS 6.0  HGB 12.4*  HCT 39.5  MCV 84.8  PLT 536   Basic Metabolic Panel: Recent Labs  Lab 09/04/22 1512  NA 139  K 3.5  CL 104  CO2 28  GLUCOSE 114*  BUN 15  CREATININE 1.09  CALCIUM 9.0   GFR: Estimated Creatinine Clearance: 105.8 mL/min (by C-G formula based on SCr of  1.09 mg/dL). Liver Function Tests: Recent Labs  Lab 09/04/22 1512  AST 58*  ALT 71*  ALKPHOS 39  BILITOT 0.7  PROT 7.8  ALBUMIN 4.2   Recent Labs  Lab 09/04/22 1512  LIPASE 1,531*   No results for input(s): "AMMONIA" in the last 168 hours. Coagulation Profile: No results for input(s): "INR", "PROTIME" in the last 168 hours. Cardiac Enzymes: No results for input(s): "CKTOTAL", "CKMB", "CKMBINDEX", "TROPONINI" in the last 168 hours. BNP (last 3 results) No results for input(s): "PROBNP" in the last 8760 hours. HbA1C: No results for input(s): "HGBA1C" in the last 72 hours. CBG: No results for input(s): "GLUCAP" in the last 168 hours. Lipid Profile: No results for input(s): "CHOL", "HDL", "LDLCALC", "TRIG", "CHOLHDL", "LDLDIRECT" in the last 72 hours. Thyroid Function Tests: No results for input(s): "TSH", "T4TOTAL", "FREET4", "T3FREE", "THYROIDAB" in the last 72 hours. Anemia Panel: No results for input(s): "VITAMINB12", "FOLATE", "FERRITIN", "TIBC", "IRON", "RETICCTPCT" in the last 72 hours. Urine analysis:    Component Value Date/Time   COLORURINE YELLOW 12/04/2020 1527   APPEARANCEUR CLEAR 12/04/2020 1527   LABSPEC 1.015 12/04/2020 1527   PHURINE 7.5 12/04/2020 1527   GLUCOSEU NEGATIVE 12/04/2020 1527   HGBUR NEGATIVE 12/04/2020 1527   HGBUR negative 02/15/2009 0833   BILIRUBINUR NEGATIVE 12/04/2020 1527   BILIRUBINUR negative 05/11/2016 0825   KETONESUR NEGATIVE 12/04/2020 1527   PROTEINUR NEGATIVE 12/04/2020 1527   UROBILINOGEN 0.2 05/11/2016 0825   UROBILINOGEN 1.0 07/24/2014 0313   NITRITE NEGATIVE 12/04/2020 1527   LEUKOCYTESUR NEGATIVE 12/04/2020 1527    Radiological Exams on Admission: CT ABDOMEN PELVIS W CONTRAST  Result Date: 09/04/2022 CLINICAL DATA:  Nausea and vomiting. EXAM: CT ABDOMEN AND PELVIS WITH CONTRAST TECHNIQUE: Multidetector CT imaging of the abdomen and pelvis was performed using the standard protocol following bolus administration of  intravenous contrast. RADIATION DOSE REDUCTION: This exam was performed according to the departmental dose-optimization program which includes automated exposure control, adjustment of the mA and/or kV according to patient size and/or use of iterative reconstruction technique. CONTRAST:  147m OMNIPAQUE IOHEXOL 300 MG/ML  SOLN COMPARISON:  MRI examination dated December 23, 2020 FINDINGS: Lower chest: Bibasilar dependent atelectasis.  No acute abnormality. Hepatobiliary: Low attenuation of hepatic parenchyma concerning for hepatic steatosis. No focal liver abnormality is seen. No gallstones, gallbladder wall thickening, or biliary dilatation. Pancreas: Edema of the head of the pancreas and surrounding inflammatory changes concerning for acute pancreatitis. No peripancreatic fluid collection. Spleen: Normal in size without focal abnormality. Adrenals/Urinary Tract: Adrenal glands are unremarkable. Simple parapelvic cyst in the right kidney. Kidneys are normal, without renal calculi, focal lesion, or hydronephrosis. Bladder is unremarkable. Stomach/Bowel: Stomach is within  normal limits. Appendix appears normal. No evidence of bowel wall thickening, distention, or inflammatory changes. Vascular/Lymphatic: No significant vascular findings are present. No enlarged abdominal or pelvic lymph nodes. Reproductive: Prostate is unremarkable. Other: Fat containing left inguinal hernia. No abdominopelvic ascites. Musculoskeletal: Mild multilevel degenerate disc disease. No acute osseous abnormality. IMPRESSION: 1. Edema of the head of the pancreas with surrounding inflammatory changes concerning for acute pancreatitis. No peripancreatic fluid collection. 2. Hepatic steatosis. 3. No evidence of bowel obstruction or bowel wall thickening. Normal appendix. 4. Fat containing left inguinal hernia. Electronically Signed   By: Keane Police D.O.   On: 09/04/2022 17:22      Antonieta Pert MD [facility]  If 7PM-7AM, please  contact night-coverage [email or web address]  09/04/2022, 6:01 PM

## 2022-09-04 NOTE — Hospital Course (Addendum)
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  In the ED blood pressure 140s to 150s, afebrile saturating 93 to 98% on room air heart rate in the 70s, Lipase 1531, AST/ALT 58/71, total bili normal at 0.7, CBC fairly stable. CT abdomen pelvis with contrast acute pancreatitis without peripancreatic fluid collection, hepatic steatosis, no bowel obstruction, fat-containing left inguinal hernia. Patient was given IV opiates for pain control, 1 L bolus and admission was requested

## 2022-09-05 DIAGNOSIS — E876 Hypokalemia: Secondary | ICD-10-CM | POA: Diagnosis present

## 2022-09-05 DIAGNOSIS — I10 Essential (primary) hypertension: Secondary | ICD-10-CM | POA: Diagnosis present

## 2022-09-05 DIAGNOSIS — R911 Solitary pulmonary nodule: Secondary | ICD-10-CM | POA: Diagnosis present

## 2022-09-05 DIAGNOSIS — Z885 Allergy status to narcotic agent status: Secondary | ICD-10-CM | POA: Diagnosis not present

## 2022-09-05 DIAGNOSIS — Z8249 Family history of ischemic heart disease and other diseases of the circulatory system: Secondary | ICD-10-CM | POA: Diagnosis not present

## 2022-09-05 DIAGNOSIS — Z789 Other specified health status: Secondary | ICD-10-CM | POA: Diagnosis not present

## 2022-09-05 DIAGNOSIS — E785 Hyperlipidemia, unspecified: Secondary | ICD-10-CM | POA: Diagnosis present

## 2022-09-05 DIAGNOSIS — K219 Gastro-esophageal reflux disease without esophagitis: Secondary | ICD-10-CM | POA: Diagnosis present

## 2022-09-05 DIAGNOSIS — E669 Obesity, unspecified: Secondary | ICD-10-CM | POA: Diagnosis present

## 2022-09-05 DIAGNOSIS — K852 Alcohol induced acute pancreatitis without necrosis or infection: Secondary | ICD-10-CM | POA: Diagnosis present

## 2022-09-05 DIAGNOSIS — K760 Fatty (change of) liver, not elsewhere classified: Secondary | ICD-10-CM | POA: Diagnosis present

## 2022-09-05 DIAGNOSIS — Z79899 Other long term (current) drug therapy: Secondary | ICD-10-CM | POA: Diagnosis not present

## 2022-09-05 DIAGNOSIS — K859 Acute pancreatitis without necrosis or infection, unspecified: Secondary | ICD-10-CM | POA: Diagnosis present

## 2022-09-05 DIAGNOSIS — Z6831 Body mass index (BMI) 31.0-31.9, adult: Secondary | ICD-10-CM | POA: Diagnosis not present

## 2022-09-05 DIAGNOSIS — K753 Granulomatous hepatitis, not elsewhere classified: Secondary | ICD-10-CM | POA: Diagnosis present

## 2022-09-05 DIAGNOSIS — Z20822 Contact with and (suspected) exposure to covid-19: Secondary | ICD-10-CM | POA: Diagnosis present

## 2022-09-05 DIAGNOSIS — K589 Irritable bowel syndrome without diarrhea: Secondary | ICD-10-CM | POA: Diagnosis present

## 2022-09-05 LAB — HIV ANTIBODY (ROUTINE TESTING W REFLEX): HIV Screen 4th Generation wRfx: NONREACTIVE

## 2022-09-05 LAB — COMPREHENSIVE METABOLIC PANEL
ALT: 54 U/L — ABNORMAL HIGH (ref 0–44)
AST: 34 U/L (ref 15–41)
Albumin: 3.8 g/dL (ref 3.5–5.0)
Alkaline Phosphatase: 38 U/L (ref 38–126)
Anion gap: 8 (ref 5–15)
BUN: 18 mg/dL (ref 6–20)
CO2: 26 mmol/L (ref 22–32)
Calcium: 8.4 mg/dL — ABNORMAL LOW (ref 8.9–10.3)
Chloride: 106 mmol/L (ref 98–111)
Creatinine, Ser: 0.99 mg/dL (ref 0.61–1.24)
GFR, Estimated: >60 mL/min (ref >60)
Glucose, Bld: 129 mg/dL — ABNORMAL HIGH (ref 70–99)
Potassium: 3.6 mmol/L (ref 3.5–5.1)
Sodium: 140 mmol/L (ref 135–145)
Total Bilirubin: 0.9 mg/dL (ref 0.3–1.2)
Total Protein: 6.7 g/dL (ref 6.5–8.1)

## 2022-09-05 LAB — CBC
HCT: 38.3 % — ABNORMAL LOW (ref 39.0–52.0)
Hemoglobin: 12 g/dL — ABNORMAL LOW (ref 13.0–17.0)
MCH: 26.8 pg (ref 26.0–34.0)
MCHC: 31.3 g/dL (ref 30.0–36.0)
MCV: 85.7 fL (ref 80.0–100.0)
Platelets: 297 10*3/uL (ref 150–400)
RBC: 4.47 MIL/uL (ref 4.22–5.81)
RDW: 17.4 % — ABNORMAL HIGH (ref 11.5–15.5)
WBC: 9.7 10*3/uL (ref 4.0–10.5)
nRBC: 0 % (ref 0.0–0.2)

## 2022-09-05 LAB — LIPASE, BLOOD: Lipase: 1373 U/L — ABNORMAL HIGH (ref 11–51)

## 2022-09-05 MED ORDER — LACTATED RINGERS IV SOLN: INTRAVENOUS | Status: AC

## 2022-09-05 MED ORDER — ADULT MULTIVITAMIN W/MINERALS CH
1.0000 | ORAL_TABLET | Freq: Every day | ORAL | Status: DC
  Administered 2022-09-05 – 2022-09-08 (×4): 1 via ORAL
  Filled 2022-09-05 (×4): qty 1

## 2022-09-05 MED ORDER — LORAZEPAM 2 MG/ML IJ SOLN: 1.0000 mg | INTRAMUSCULAR | Status: AC | PRN

## 2022-09-05 MED ORDER — LORAZEPAM 1 MG PO TABS: 1.0000 mg | ORAL_TABLET | ORAL | Status: AC | PRN

## 2022-09-05 MED ORDER — LACTATED RINGERS IV SOLN: INTRAVENOUS | Status: DC

## 2022-09-05 NOTE — Plan of Care (Signed)
  Problem: Pain Managment: Goal: General experience of comfort will improve Outcome: Not Progressing   Problem: Education: Goal: Knowledge of General Education information will improve Description: Including pain rating scale, medication(s)/side effects and non-pharmacologic comfort measures Outcome: Progressing   Problem: Health Behavior/Discharge Planning: Goal: Ability to manage health-related needs will improve Outcome: Progressing   Problem: Clinical Measurements: Goal: Ability to maintain clinical measurements within normal limits will improve Outcome: Progressing Goal: Will remain free from infection Outcome: Progressing Goal: Diagnostic test results will improve Outcome: Progressing Goal: Respiratory complications will improve Outcome: Progressing Goal: Cardiovascular complication will be avoided Outcome: Progressing   Problem: Activity: Goal: Risk for activity intolerance will decrease Outcome: Progressing   Problem: Nutrition: Goal: Adequate nutrition will be maintained Outcome: Progressing   Problem: Coping: Goal: Level of anxiety will decrease Outcome: Progressing   Problem: Elimination: Goal: Will not experience complications related to bowel motility Outcome: Progressing Goal: Will not experience complications related to urinary retention Outcome: Progressing   Problem: Safety: Goal: Ability to remain free from injury will improve Outcome: Progressing   Problem: Skin Integrity: Goal: Risk for impaired skin integrity will decrease Outcome: Progressing   

## 2022-09-05 NOTE — Progress Notes (Signed)
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 , pulmonary nodules, possible sarcoidosis, followed by [facility] gastroenterology presented with upper abdominal pain x1 day started suddenly constant, radiation to the back, epigastrium and right upper quadrant with associated nausea but no vomiting, no melena hematochezia, patient returned from cruise on 10/.He was in cruise last week and returned on the weekend and had alcohol use over the In the ED blood work showed elevated lipase 1531, AST/ALT 58/71, total bili normal at 0.7, CBC fairly stable.CT abdomen pelvis with contrast acute pancreatitis without peripancreatic fluid collection, hepatic steatosis, no bowel obstruction, fat-containing left inguinal hernia. Patient admitted for acute alcoholic pancreatitis    Subjective: Seen and examined this morning.  His father is at the bedside.  Still complains of mid abdominal pain needing IV Dilaudid, has gotten 3 doses since admission iv, feels better overall no nausea vomiting Overnight patient has been afebrile AST improved 34 ALT 54, lipase 1373 Oral mucosa dry  Assessment and Plan: Active Problems:   Acute pancreatitis  Acute Alcoholic pancreatitis: had previous episode few years ago.  Continue on aggressive IV fluid hydration> increased to 200 cc for next 24 hours, cont clear liquid diet oral and IV opiates for pain control PPI, trend LFTs.  Advised alcohol cessation.  Continue to monitor clinical course  Recent alcohol use: Monitor closely for withdrawal symptoms continue Ativan CIWA scale thiamine folate Mild transaminitis suspect in the setting of alcohol use, AST improved.  Trend lfts. Hypertension/HyperLipidemia:BP borderline controlled continue  amlodipine, continue Tricor blood pressure if remains uncontrolled resume his ARB/HCTZ   History of iron-deficiency anemia hemoglobin stable-is due to see GI this month for EGD colonoscopy, he has had received IV iron History of granulomatous hepatitis/hepatic steatosis/possible sarcoidosis he is followed by [facility] Pulmonary nodule decreased in size based on recent CT 8/18 no further nodule follow-up  wasadvised  Class I Obesity:Patient's Body mass index is 31.54 kg/m. : Will benefit with PCP follow-up, weight loss  healthy lifestyle and outpatient sleep evaluation.   DVT prophylaxis: lovenox Code Status:   Code Status: Full Code Family Communication: plan of care discussed with patient at bedside. Patient status is: Observation changed to inpatient because of acute pancreatitis and need for IV fluids and IV pain medicine Level of care: Med-Surg   Dispo: The patient is from: Home            Anticipated disposition: Home in 2 to 3 days Objective: Vitals last 24 hrs: Vitals:   09/04/22 2034 09/05/22 0019 09/05/22 0443 09/05/22 0810  BP: (!) 162/102 (!) 176/98 (!) 146/92 (!) 151/96  Pulse: 87 89 88 93  Resp: '16 16 16 20  '$ Temp: 97.9 F (36.6 C) 97.9 F (36.6 C) (!) 97.5 F (36.4 C) 98.5 F (36.9 C)  TempSrc: Oral Oral Oral Oral  SpO2: 92% 93% 95% 91%  Weight: 105.5 kg     Height:       Weight change:   Physical Examination: General exam: alert awake, older than stated age HEENT:Oral mucosa dry, ear/Nose WNL grossly Respiratory system: bilaterally clear BS, no use of accessory muscle Cardiovascular system: S1 & S2 +, No JVD. Gastrointestinal system: Abdomen soft, mildly tender mid abdomen,ND, BS+ Nervous System:Alert, awake, moving extremities. Extremities: LE edema neg,distal peripheral pulses palpable.  Skin: No rashes,no icterus. MSK: Normal  muscle bulk,tone, power  Medications reviewed:  Scheduled Meds:  amLODipine  10 mg Oral Q lunch   enoxaparin (LOVENOX)  injection  50 mg Subcutaneous Q24H   fenofibrate  54 mg Oral Daily   folic acid  1 mg Oral Daily   pantoprazole (PROTONIX) IV  40 mg Intravenous Q24H   thiamine  100 mg Oral Daily  Continuous Infusions:  lactated ringers 150 mL/hr at 09/05/22 0758    Diet Order             Diet clear liquid Room service appropriate? Yes; Fluid consistency: Thin  Diet effective now                  Intake/Output Summary (Last 24 hours) at 09/05/2022 0857 Last data filed at 09/05/2022 0503 Gross per 24 hour  Intake 2413.48 ml  Output 1 ml  Net 2412.48 ml   Net IO Since Admission: 2,412.48 mL [09/05/22 0857]  Wt Readings from Last 3 Encounters:  09/04/22 105.5 kg  08/24/22 105.6 kg  08/23/22 104.3 kg     Unresulted Labs (From admission, onward)     Start     Ordered   09/11/22 0500  Creatinine, serum  (enoxaparin (LOVENOX)    CrCl >/= 30 ml/min)  Weekly,   R     Comments: while on enoxaparin therapy    09/04/22 1758   09/05/22 0500  Comprehensive metabolic panel  Daily at 5am,   R      09/04/22 1758   09/05/22 0500  CBC  Daily at 5am,   R      09/04/22 1758   09/05/22 0500  Lipase, blood  Daily at 5am,   R      09/04/22 1758   09/05/22 0500  HIV Antibody (routine testing w rflx)  (HIV Antibody (Routine testing w reflex) panel)  Tomorrow morning,   R        09/04/22 2052          Data Reviewed: I have personally reviewed following labs and imaging studies CBC: Recent Labs  Lab 09/04/22 1512 09/05/22 0559  WBC 7.9 9.7  NEUTROABS 6.0  --   HGB 12.4* 12.0*  HCT 39.5 38.3*  MCV 84.8 85.7  PLT 302 974   Basic Metabolic Panel: Recent Labs  Lab 09/04/22 1512 09/05/22 0559  NA 139 140  K 3.5 3.6  CL 104 106  CO2 28 26  GLUCOSE 114* 129*  BUN 15 18  CREATININE 1.09 0.99  CALCIUM 9.0 8.4*   GFR: Estimated Creatinine Clearance: 117.1 mL/min (by C-G formula based on SCr of 0.99 mg/dL). Liver Function Tests: Recent Labs  Lab 09/04/22 1512 09/05/22 0559  AST 58* 34   ALT 71* 54*  ALKPHOS 39 38  BILITOT 0.7 0.9  PROT 7.8 6.7  ALBUMIN 4.2 3.8   Recent Labs  Lab 09/04/22 1512 09/05/22 0559  LIPASE 1,531* 1,373*   No results for input(s): "AMMONIA" in the last 168 hours. Coagulation Profile: No results for input(s): "INR", "PROTIME" in the last 168 hours. BNP (last 3 results) No results for input(s): "PROBNP" in the last 8760 hours. HbA1C: No results for input(s): "HGBA1C" in the last 72 hours. CBG: No results for input(s): "GLUCAP" in the last 168 hours. Lipid Profile: No results for input(s): "CHOL", "HDL", "LDLCALC", "TRIG", "CHOLHDL", "LDLDIRECT" in the last 72 hours. Thyroid Function Tests: No results for input(s): "TSH", "T4TOTAL", "FREET4", "T3FREE", "THYROIDAB" in the last 72 hours. Sepsis Labs: No results for  input(s): "PROCALCITON", "LATICACIDVEN" in the last 168 hours.  No results found for this or any previous visit (from the past 240 hour(s)).  Antimicrobials: Anti-infectives (From admission, onward)    None      Culture/Microbiology    Component Value Date/Time   SDES BLOOD RIGHT ARM 07/23/2014 2357   SPECREQUEST BOTTLES DRAWN AEROBIC AND ANAEROBIC 10CC 07/23/2014 2357   CULT  07/23/2014 2357    NO GROWTH 5 DAYS Performed at [place] 07/30/2014 FINAL 07/23/2014 2357   Other culture-see note  Radiology Studies: CT ABDOMEN PELVIS W CONTRAST  Result Date: 09/04/2022 CLINICAL DATA:  Nausea and vomiting. EXAM: CT ABDOMEN AND PELVIS WITH CONTRAST TECHNIQUE: Multidetector CT imaging of the abdomen and pelvis was performed using the standard protocol following bolus administration of intravenous contrast. RADIATION DOSE REDUCTION: This exam was performed according to the departmental dose-optimization program which includes automated exposure control, adjustment of the mA and/or kV according to patient size and/or use of iterative reconstruction technique. CONTRAST:  139m OMNIPAQUE IOHEXOL 300 MG/ML   SOLN COMPARISON:  MRI examination dated December 23, 2020 FINDINGS: Lower chest: Bibasilar dependent atelectasis.  No acute abnormality. Hepatobiliary: Low attenuation of hepatic parenchyma concerning for hepatic steatosis. No focal liver abnormality is seen. No gallstones, gallbladder wall thickening, or biliary dilatation. Pancreas: Edema of the head of the pancreas and surrounding inflammatory changes concerning for acute pancreatitis. No peripancreatic fluid collection. Spleen: Normal in size without focal abnormality. Adrenals/Urinary Tract: Adrenal glands are unremarkable. Simple parapelvic cyst in the right kidney. Kidneys are normal, without renal calculi, focal lesion, or hydronephrosis. Bladder is unremarkable. Stomach/Bowel: Stomach is within normal limits. Appendix appears normal. No evidence of bowel wall thickening, distention, or inflammatory changes. Vascular/Lymphatic: No significant vascular findings are present. No enlarged abdominal or pelvic lymph nodes. Reproductive: Prostate is unremarkable. Other: Fat containing left inguinal hernia. No abdominopelvic ascites. Musculoskeletal: Mild multilevel degenerate disc disease. No acute osseous abnormality. IMPRESSION: 1. Edema of the head of the pancreas with surrounding inflammatory changes concerning for acute pancreatitis. No peripancreatic fluid collection. 2. Hepatic steatosis. 3. No evidence of bowel obstruction or bowel wall thickening. Normal appendix. 4. Fat containing left inguinal hernia. Electronically Signed   By: IKeane PoliceD.O.   On: 09/04/2022 17:22     LOS: 0 days   RAntonieta Pert MD [facility]  09/05/2022, 8:57 AM

## 2022-09-05 NOTE — TOC Progression Note (Signed)
Transition of Care [facility]) - Progression Note    Patient Details  Name: Christopher [name] MRN: [id number] Date of Birth: June 26, 1976  Transition of Care [facility]) CM/SW Contact  Servando Snare, [place] Phone Number: 09/05/2022, 8:43 AM  Clinical Narrative:     Transition of Care [facility]) Screening Note   Patient Details  Name: Christopher [name] Date of Birth: January 02, 1976   Transition of Care [facility]) CM/SW Contact:    Servando Snare, LCSW Phone Number: 09/05/2022, 8:43 AM    Transition of Care Department [facility]) has reviewed patient and no TOC needs have been identified at this time. We will continue to monitor patient advancement through interdisciplinary progression rounds. If new patient transition needs arise, please place a TOC consult.          Expected Discharge Plan and Services                                                 Social Determinants of Health (SDOH) Interventions    Readmission Risk Interventions     No data to display

## 2022-09-05 NOTE — Plan of Care (Signed)
  Problem: Pain Managment: Goal: General experience of comfort will improve Outcome: Progressing   Problem: Activity: Goal: Risk for activity intolerance will decrease Outcome: Progressing   

## 2022-09-06 ENCOUNTER — Ambulatory Visit: Payer: 59

## 2022-09-06 DIAGNOSIS — K852 Alcohol induced acute pancreatitis without necrosis or infection: Secondary | ICD-10-CM | POA: Diagnosis not present

## 2022-09-06 DIAGNOSIS — Z789 Other specified health status: Secondary | ICD-10-CM

## 2022-09-06 LAB — CBC
HCT: 34.9 % — ABNORMAL LOW (ref 39.0–52.0)
Hemoglobin: 11.2 g/dL — ABNORMAL LOW (ref 13.0–17.0)
MCH: 27.5 pg (ref 26.0–34.0)
MCHC: 32.1 g/dL (ref 30.0–36.0)
MCV: 85.5 fL (ref 80.0–100.0)
Platelets: 256 10*3/uL (ref 150–400)
RBC: 4.08 MIL/uL — ABNORMAL LOW (ref 4.22–5.81)
RDW: 17.8 % — ABNORMAL HIGH (ref 11.5–15.5)
WBC: 9.4 10*3/uL (ref 4.0–10.5)
nRBC: 0 % (ref 0.0–0.2)

## 2022-09-06 LAB — COMPREHENSIVE METABOLIC PANEL
ALT: 37 U/L (ref 0–44)
AST: 25 U/L (ref 15–41)
Albumin: 3.3 g/dL — ABNORMAL LOW (ref 3.5–5.0)
Alkaline Phosphatase: 32 U/L — ABNORMAL LOW (ref 38–126)
Anion gap: 7 (ref 5–15)
BUN: 14 mg/dL (ref 6–20)
CO2: 25 mmol/L (ref 22–32)
Calcium: 7.8 mg/dL — ABNORMAL LOW (ref 8.9–10.3)
Chloride: 103 mmol/L (ref 98–111)
Creatinine, Ser: 1.07 mg/dL (ref 0.61–1.24)
GFR, Estimated: >60 mL/min (ref >60)
Glucose, Bld: 104 mg/dL — ABNORMAL HIGH (ref 70–99)
Potassium: 3.2 mmol/L — ABNORMAL LOW (ref 3.5–5.1)
Sodium: 135 mmol/L (ref 135–145)
Total Bilirubin: 1.4 mg/dL — ABNORMAL HIGH (ref 0.3–1.2)
Total Protein: 6 g/dL — ABNORMAL LOW (ref 6.5–8.1)

## 2022-09-06 LAB — LIPASE, BLOOD: Lipase: 648 U/L — ABNORMAL HIGH (ref 11–51)

## 2022-09-06 MED ORDER — POTASSIUM CHLORIDE CRYS ER 20 MEQ PO TBCR
40.0000 meq | EXTENDED_RELEASE_TABLET | ORAL | Status: AC
  Administered 2022-09-06 (×2): 40 meq via ORAL
  Filled 2022-09-06 (×2): qty 2

## 2022-09-06 NOTE — Plan of Care (Signed)
  Problem: Activity: Goal: Risk for activity intolerance will decrease Outcome: Progressing   Problem: Pain Managment: Goal: General experience of comfort will improve Outcome: Progressing   Problem: Clinical Measurements: Goal: Diagnostic test results will improve Outcome: Progressing

## 2022-09-06 NOTE — Progress Notes (Signed)
  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  Assessment and Plan: Acute Alcoholic pancreatitis --Recommend strict abstinence from alcohol.  Still having some abdominal pain.  We will continue present diet, no opportunity for advancement yet.  Continue analgesics as needed.  Recent alcohol use --denies daily use or abuse --no s/s of withdrawal  Mild transaminitis --known h/o hepatic steatosis, now WNL, no further evaluation  Hypokalemia --replete  Essential hypertension/hyperLipidemia --stable  History of granulomatous hepatitis/hepatic steatosis/possible sarcoidosis  --followed by [facility]  Pulmonary nodule  --no follow-up advised  Class I Obesity --Body mass index is 31.54 kg/m.       Subjective:  Better since admission but still has some abd pain with jello and liquids, still using pain meds Doesn't drink daily, but did indulge on recent cruise Prior episode related to alcohol  Physical Exam: Vitals:   09/05/22 2225 09/06/22 0546 09/06/22 1302 09/06/22 1344  BP: (!) 146/85 (!) 154/91 (!) 156/86 (!) 147/85  Pulse: 92 93  85  Resp: '20 18  18  '$ Temp: 98.8 F (37.1 C) 99.5 F (37.5 C)  (!) 100.7 F (38.2 C)  TempSrc: Oral Oral  Oral  SpO2: 95% 92%  96%  Weight:      Height:       Physical Exam Vitals reviewed.  Constitutional:      General: He is not in acute distress.    Appearance: He is not ill-appearing or toxic-appearing.  Cardiovascular:     Rate and Rhythm: Normal rate and regular rhythm.     Heart sounds: No murmur heard.    Comments:  Telemetry SR Pulmonary:     Effort: Pulmonary effort is normal. No respiratory distress.     Breath sounds: No wheezing, rhonchi or rales.  Abdominal:     Palpations: Abdomen is soft.     Tenderness: There is abdominal tenderness.  Musculoskeletal:     Right lower leg: No edema.     Left lower leg: No edema.  Neurological:     Mental Status: He is alert.  Psychiatric:        Mood and Affect: Mood normal.        Behavior: Behavior normal.     Data Reviewed:  UOP not quantified K+ 3.2 Lipase down to 648 Transminases now WNL CBC noted  Family Communication: none  Disposition: Status is: Inpatient Remains inpatient appropriate because: acute pancreatitis  Planned Discharge Destination: Home    Time spent: 35 minutes  Author: Murray Hodgkins, MD 09/06/2022 5:16 PM  For on call review [email or web address].

## 2022-09-07 DIAGNOSIS — Z789 Other specified health status: Secondary | ICD-10-CM | POA: Diagnosis not present

## 2022-09-07 DIAGNOSIS — K852 Alcohol induced acute pancreatitis without necrosis or infection: Secondary | ICD-10-CM | POA: Diagnosis not present

## 2022-09-07 LAB — COMPREHENSIVE METABOLIC PANEL
ALT: 27 U/L (ref 0–44)
AST: 19 U/L (ref 15–41)
Albumin: 3 g/dL — ABNORMAL LOW (ref 3.5–5.0)
Alkaline Phosphatase: 31 U/L — ABNORMAL LOW (ref 38–126)
Anion gap: 6 (ref 5–15)
BUN: 10 mg/dL (ref 6–20)
CO2: 24 mmol/L (ref 22–32)
Calcium: 7.9 mg/dL — ABNORMAL LOW (ref 8.9–10.3)
Chloride: 104 mmol/L (ref 98–111)
Creatinine, Ser: 1.03 mg/dL (ref 0.61–1.24)
GFR, Estimated: >60 mL/min (ref >60)
Glucose, Bld: 97 mg/dL (ref 70–99)
Potassium: 3.4 mmol/L — ABNORMAL LOW (ref 3.5–5.1)
Sodium: 134 mmol/L — ABNORMAL LOW (ref 135–145)
Total Bilirubin: 1.6 mg/dL — ABNORMAL HIGH (ref 0.3–1.2)
Total Protein: 5.9 g/dL — ABNORMAL LOW (ref 6.5–8.1)

## 2022-09-07 LAB — LIPASE, BLOOD: Lipase: 131 U/L — ABNORMAL HIGH (ref 11–51)

## 2022-09-07 MED ORDER — GUAIFENESIN ER 600 MG PO TB12
600.0000 mg | ORAL_TABLET | Freq: Two times a day (BID) | ORAL | Status: DC
  Administered 2022-09-07 – 2022-09-08 (×2): 600 mg via ORAL
  Filled 2022-09-07 (×2): qty 1

## 2022-09-07 MED ORDER — GUAIFENESIN-DM 100-10 MG/5ML PO SYRP
10.0000 mL | ORAL_SOLUTION | Freq: Four times a day (QID) | ORAL | Status: AC | PRN
  Administered 2022-09-07 – 2022-09-08 (×2): 10 mL via ORAL
  Filled 2022-09-07 (×2): qty 10

## 2022-09-07 MED ORDER — POTASSIUM CHLORIDE CRYS ER 20 MEQ PO TBCR
40.0000 meq | EXTENDED_RELEASE_TABLET | ORAL | Status: AC
  Administered 2022-09-07 (×2): 40 meq via ORAL
  Filled 2022-09-07 (×2): qty 2

## 2022-09-07 NOTE — TOC Progression Note (Addendum)
Transition of Care [facility]) - Progression Note    Patient Details  Name: Christopher [name] MRN: [id number] Date of Birth: [birth year]  Transition of Care [facility]) CM/SW [place], RN Phone Number:[phone]  09/07/2022, 10:07 AM  Clinical Narrative:    TOC acknowledges consult for substance abuse counseling. CAGE aid has been completed and resources added to AVS. No other needs noted at this time.         Expected Discharge Plan and Services                                                 Social Determinants of Health (SDOH) Interventions    Readmission Risk Interventions     No data to display

## 2022-09-07 NOTE — Progress Notes (Signed)
  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  Assessment and Plan: Acute Alcoholic pancreatitis --Recommend strict abstinence from alcohol.   -- Improved today.  Will advance diet.  Continue supportive care.   Recent alcohol use --denies daily use or abuse --no s/s of withdrawal   Mild transaminitis --known h/o hepatic steatosis, now WNL, no further evaluation   Hypokalemia --will replete   Essential hypertension/hyperlipidemia --stable   History of granulomatous hepatitis/hepatic steatosis/possible sarcoidosis  --followed by [facility]   Pulmonary nodule  --no follow-up advised   Class I Obesity --Body mass index is 31.54 kg/m.      Subjective:  Feels better, less pain, tolerating liquids BM x2  Physical Exam: Vitals:   09/06/22 1302 09/06/22 1344 09/06/22 2106 09/07/22 0506  BP: (!) 156/86 (!) 147/85 (!) 156/86 (!) 142/81  Pulse:  85 97 88  Resp:  '18 18 20  '$ Temp:  (!) 100.7 F (38.2 C) (!) 100.4 F (38 C) 99.8 F (37.7 C)  TempSrc:  Oral Oral Oral  SpO2:  96% 92% 94%  Weight:      Height:       Physical Exam Vitals and nursing note reviewed.  Constitutional:      General: He is not in acute distress.    Appearance: He is not ill-appearing or toxic-appearing.  Cardiovascular:     Rate and Rhythm: Normal rate and regular rhythm.     Heart sounds: No murmur heard. Pulmonary:     Effort: Pulmonary effort is normal. No respiratory distress.     Breath sounds: No wheezing or rales.  Abdominal:     Palpations: Abdomen is soft.     Tenderness: There  is no abdominal tenderness.  Neurological:     Mental Status: He is alert.  Psychiatric:        Mood and Affect: Mood normal.        Behavior: Behavior normal.     Data Reviewed:  K+ 3.4 Lipase 648 > 131  Family Communication: mother at bedside  Disposition: Status is: Inpatient Remains inpatient appropriate because: acute pancreatitis   Planned Discharge Destination: Home    Time spent: 25 minutes  Author: Murray Hodgkins, MD 09/07/2022 10:44 AM  For on call review [email or web address].

## 2022-09-08 ENCOUNTER — Inpatient Hospital Stay (HOSPITAL_COMMUNITY): Payer: 59

## 2022-09-08 LAB — COMPREHENSIVE METABOLIC PANEL
ALT: 29 U/L (ref 0–44)
AST: 23 U/L (ref 15–41)
Albumin: 3 g/dL — ABNORMAL LOW (ref 3.5–5.0)
Alkaline Phosphatase: 33 U/L — ABNORMAL LOW (ref 38–126)
Anion gap: 8 (ref 5–15)
BUN: 9 mg/dL (ref 6–20)
CO2: 22 mmol/L (ref 22–32)
Calcium: 7.9 mg/dL — ABNORMAL LOW (ref 8.9–10.3)
Chloride: 103 mmol/L (ref 98–111)
Creatinine, Ser: 0.82 mg/dL (ref 0.61–1.24)
GFR, Estimated: >60 mL/min (ref >60)
Glucose, Bld: 118 mg/dL — ABNORMAL HIGH (ref 70–99)
Potassium: 3.3 mmol/L — ABNORMAL LOW (ref 3.5–5.1)
Sodium: 133 mmol/L — ABNORMAL LOW (ref 135–145)
Total Bilirubin: 1.2 mg/dL (ref 0.3–1.2)
Total Protein: 6.1 g/dL — ABNORMAL LOW (ref 6.5–8.1)

## 2022-09-08 LAB — SARS CORONAVIRUS 2 BY RT PCR: SARS Coronavirus 2 by RT PCR: NEGATIVE

## 2022-09-08 MED ORDER — POTASSIUM CHLORIDE CRYS ER 20 MEQ PO TBCR
40.0000 meq | EXTENDED_RELEASE_TABLET | ORAL | Status: AC
  Administered 2022-09-08 (×2): 40 meq via ORAL
  Filled 2022-09-08 (×2): qty 2

## 2022-09-08 MED ORDER — PANTOPRAZOLE SODIUM 40 MG PO TBEC
40.0000 mg | DELAYED_RELEASE_TABLET | Freq: Every day | ORAL | Status: DC
  Administered 2022-09-08: 40 mg via ORAL
  Filled 2022-09-08: qty 1

## 2022-09-08 NOTE — Plan of Care (Signed)
  Problem: Education: Goal: Knowledge of General Education information will improve Description Including pain rating scale, medication(s)/side effects and non-pharmacologic comfort measures Outcome: Progressing   Problem: Health Behavior/Discharge Planning: Goal: Ability to manage health-related needs will improve Outcome: Progressing   

## 2022-09-08 NOTE — Discharge Summary (Signed)
Physician Discharge Summary   Patient: Christopher [name] MRN: [id number] DOB: February 28, 1976  Admit date:     09/04/2022  Discharge date: 09/08/22  Discharge Physician: Murray Hodgkins   PCP: [name], MD   Recommendations at discharge:    Resolution of pancreatitis  Discharge Diagnoses: Principal Problem:   Acute pancreatitis Active Problems:   Essential hypertension   Alcohol use  Resolved Problems:   * No resolved hospital problems. 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  Acute Alcoholic pancreatitis -- Recommend strict abstinence from alcohol.   -- resolved   Fever and cough. --SARS-CoV-2 negative.  Chest x-ray showed atelectasis. --Incentive spirometry. --no further evaluation   Recent alcohol use --denies daily use or abuse --no s/s of withdrawal   Mild transaminitis --known h/o hepatic steatosis, now WNL, no further evaluation   Essential hypertension/hyperlipidemia --stable   History of granulomatous hepatitis/hepatic steatosis/possible sarcoidosis  --followed by [facility]  Class I Obesity --Body mass index is 31.54 kg/m.      Consultants:  None  Procedures performed:  None   Disposition: Home Diet recommendation:  Low fat DISCHARGE MEDICATION: Allergies as of 09/08/2022       Reactions   Morphine And Related Nausea And Vomiting, Other (See Comments)   Sweats, also        Medication List     TAKE these medications    acetaminophen 500 MG tablet Commonly known as: TYLENOL Take 1,000 mg by mouth every 6 (six) hours as needed for mild pain or headache.   amLODipine 10 MG tablet Commonly known as: NORVASC Take 1 tablet (10 mg total) by mouth daily. What changed: when  to take this   fenofibrate 145 MG tablet Commonly known as: TRICOR TAKE 1 TABLET BY MOUTH  DAILY What changed: when to take this   ibuprofen 200 MG tablet Commonly known as: ADVIL Take 400 mg by mouth every 6 (six) hours as needed for mild pain or headache.   Integra Plus Caps Take 1 capsule by mouth every morning.   multivitamin with minerals Tabs tablet Take 1 tablet by mouth daily.   pantoprazole 40 MG tablet Commonly known as: PROTONIX TAKE 1 TABLET BY MOUTH  DAILY What changed: when to take this   tadalafil 20 MG tablet Commonly known as: CIALIS Take 20 mg by mouth daily as needed.   valsartan-hydrochlorothiazide 320-25 MG tablet Commonly known as: DIOVAN-HCT TAKE 1 TABLET BY MOUTH  DAILY What changed: when to take this        Follow-up Information     [name], MD Follow up.   Specialty: Internal Medicine Why: As needed Contact information: [address] [place] [address] [phone]                Feels better, tolerating diet Some cough and one episode of fever  Discharge Exam: Filed Weights   09/04/22 1518 09/04/22 2034  Weight: 104.3 kg 105.5 kg   Physical Exam Vitals reviewed.  Constitutional:      General: He is not in acute distress.    Appearance: He is not ill-appearing or toxic-appearing.  Cardiovascular:     Rate and Rhythm: Normal rate and regular rhythm.     Heart sounds: No murmur heard. Pulmonary:     Effort: Pulmonary effort is  normal. No respiratory distress.     Breath sounds: No wheezing, rhonchi or rales.  Abdominal:     Palpations: Abdomen is soft.     Tenderness: There is no abdominal tenderness.  Neurological:     Mental Status: He is alert.  Psychiatric:        Mood and Affect: Mood normal.        Behavior: Behavior normal.      Condition at discharge: good  The results of significant diagnostics from this hospitalization (including imaging, microbiology, ancillary and laboratory) are  listed below for reference.   Imaging Studies: DG Chest 2 View  Result Date: 09/08/2022 CLINICAL DATA:  Productive cough started yesterday. EXAM: CHEST - 2 VIEW COMPARISON:  08/14/2021 FINDINGS: Low lung volumes. Heart size is normal. There is atelectasis in the RIGHT perihilar region. There are small bilateral pleural effusions. No evidence for edema. IMPRESSION: 1. Small bilateral pleural effusions. 2. Atelectasis in the RIGHT perihilar region. Electronically Signed   By: Nolon Nations M.D.   On: 09/08/2022 12:49   CT ABDOMEN PELVIS W CONTRAST  Result Date: 09/04/2022 CLINICAL DATA:  Nausea and vomiting. EXAM: CT ABDOMEN AND PELVIS WITH CONTRAST TECHNIQUE: Multidetector CT imaging of the abdomen and pelvis was performed using the standard protocol following bolus administration of intravenous contrast. RADIATION DOSE REDUCTION: This exam was performed according to the departmental dose-optimization program which includes automated exposure control, adjustment of the mA and/or kV according to patient size and/or use of iterative reconstruction technique. CONTRAST:  116m OMNIPAQUE IOHEXOL 300 MG/ML  SOLN COMPARISON:  MRI examination dated December 23, 2020 FINDINGS: Lower chest: Bibasilar dependent atelectasis.  No acute abnormality. Hepatobiliary: Low attenuation of hepatic parenchyma concerning for hepatic steatosis. No focal liver abnormality is seen. No gallstones, gallbladder wall thickening, or biliary dilatation. Pancreas: Edema of the head of the pancreas and surrounding inflammatory changes concerning for acute pancreatitis. No peripancreatic fluid collection. Spleen: Normal in size without focal abnormality. Adrenals/Urinary Tract: Adrenal glands are unremarkable. Simple parapelvic cyst in the right kidney. Kidneys are normal, without renal calculi, focal lesion, or hydronephrosis. Bladder is unremarkable. Stomach/Bowel: Stomach is within normal limits. Appendix appears normal. No evidence of  bowel wall thickening, distention, or inflammatory changes. Vascular/Lymphatic: No significant vascular findings are present. No enlarged abdominal or pelvic lymph nodes. Reproductive: Prostate is unremarkable. Other: Fat containing left inguinal hernia. No abdominopelvic ascites. Musculoskeletal: Mild multilevel degenerate disc disease. No acute osseous abnormality. IMPRESSION: 1. Edema of the head of the pancreas with surrounding inflammatory changes concerning for acute pancreatitis. No peripancreatic fluid collection. 2. Hepatic steatosis. 3. No evidence of bowel obstruction or bowel wall thickening. Normal appendix. 4. Fat containing left inguinal hernia. Electronically Signed   By: IKeane PoliceD.O.   On: 09/04/2022 17:22    Microbiology: Results for orders placed or performed during the hospital encounter of 09/04/22  SARS Coronavirus 2 by RT PCR (hospital order[facility]) *cepheid single result test* Anterior Nasal Swab     Status: None   Collection Time: 09/08/22 12:32 PM   Specimen: Anterior Nasal Swab  Result Value Ref Range Status   SARS Coronavirus 2 by RT PCR NEGATIVE NEGATIVE Final    Comment: (NOTE) SARS-CoV-2 target nucleic acids are NOT DETECTED.  The SARS-CoV-2 RNA is generally detectable in upper and lower respiratory specimens during the acute phase of infection. The lowest concentration of SARS-CoV-2 viral copies this assay can detect is 250 copies / mL. A negative result  does not preclude SARS-CoV-2 infection and should not be used as the sole basis for treatment or other patient management decisions.  A negative result may occur with improper specimen collection / handling, submission of specimen other than nasopharyngeal swab, presence of viral mutation(s) within the areas targeted by this assay, and inadequate number of viral copies (<250 copies / mL). A negative result must be combined with clinical observations, patient history, and  epidemiological information.  Fact Sheet for Patients:   https://www.patel.info/  Fact Sheet for Healthcare Providers: https://hall.com/  This test is not yet approved or  cleared by the [place] FDA and has been authorized for detection and/or diagnosis of SARS-CoV-2 by FDA under an Emergency Use Authorization (EUA).  This EUA will remain in effect (meaning this test can be used) for the duration of the COVID-19 declaration under Section 564(b)(1) of the Act, 21 U.S.C. section 360bbb-3(b)(1), unless the authorization is terminated or revoked sooner.  Performed at [facility], [place] [address] [place], [place] [address]     Labs: CBC: Recent Labs  Lab 09/04/22 1512 09/05/22 0559 09/06/22 0518  WBC 7.9 9.7 9.4  NEUTROABS 6.0  --   --   HGB 12.4* 12.0* 11.2*  HCT 39.5 38.3* 34.9*  MCV 84.8 85.7 85.5  PLT 302 297 937   Basic Metabolic Panel: Recent Labs  Lab 09/04/22 1512 09/05/22 0559 09/06/22 0518 09/07/22 0524 09/08/22 0516  NA 139 140 135 134* 133*  K 3.5 3.6 3.2* 3.4* 3.3*  CL 104 106 103 104 103  CO2 '28 26 25 24 22  '$ GLUCOSE 114* 129* 104* 97 118*  BUN '15 18 14 10 9  '$ CREATININE 1.09 0.99 1.07 1.03 0.82  CALCIUM 9.0 8.4* 7.8* 7.9* 7.9*   Liver Function Tests: Recent Labs  Lab 09/04/22 1512 09/05/22 0559 09/06/22 0518 09/07/22 0524 09/08/22 0516  AST 58* 34 '25 19 23  '$ ALT 71* 54* 37 27 29  ALKPHOS 39 38 32* 31* 33*  BILITOT 0.7 0.9 1.4* 1.6* 1.2  PROT 7.8 6.7 6.0* 5.9* 6.1*  ALBUMIN 4.2 3.8 3.3* 3.0* 3.0*   CBG: No results for input(s): "GLUCAP" in the last 168 hours.  Discharge time spent: greater than 30 minutes.  Signed: Murray Hodgkins, MD [facility] 09/08/2022

## 2022-09-08 NOTE — Progress Notes (Signed)
PHARMACIST - PHYSICIAN COMMUNICATION  DR:   Sarajane Jews  CONCERNING: IV to Oral Route Change Policy  RECOMMENDATION: This patient is receiving pantoprazole by the intravenous route.  Based on criteria approved by the Pharmacy and Therapeutics Committee, the intravenous medication(s) is/are being converted to the equivalent oral dose form(s).   DESCRIPTION: These criteria include: The patient is eating (either orally or via tube) and/or has been taking other orally administered medications for a least 24 hours The patient has no evidence of active gastrointestinal bleeding or impaired GI absorption (gastrectomy, short bowel, patient on TNA or NPO).  If you have questions about this conversion, please contact the Pharmacy Department  '[]'$   [phone] )  Christopher [name] '[]'$   [phone] )  [facility] '[]'$   [phone] )  [facility] '[]'$   [phone] )  [facility] '[x]'$   [phone] )  Christopher [name], [facility] 09/08/2022 9:42 AM

## 2022-09-11 ENCOUNTER — Encounter: Payer: Self-pay | Admitting: Internal Medicine

## 2022-09-13 ENCOUNTER — Ambulatory Visit (INDEPENDENT_AMBULATORY_CARE_PROVIDER_SITE_OTHER): Payer: 59

## 2022-09-13 VITALS — BP 138/82 | HR 61 | Temp 98.3°F | Resp 18 | Ht 72.0 in | Wt 225.0 lb

## 2022-09-13 DIAGNOSIS — D509 Iron deficiency anemia, unspecified: Secondary | ICD-10-CM

## 2022-09-13 MED ORDER — ACETAMINOPHEN 325 MG PO TABS: 650.0000 mg | ORAL_TABLET | Freq: Once | ORAL | Status: DC

## 2022-09-13 MED ORDER — SODIUM CHLORIDE 0.9 % IV SOLN
300.0000 mg | Freq: Once | INTRAVENOUS | Status: AC
  Administered 2022-09-13: 300 mg via INTRAVENOUS
  Filled 2022-09-13: qty 15

## 2022-09-13 MED ORDER — DIPHENHYDRAMINE HCL 25 MG PO CAPS: 25.0000 mg | ORAL_CAPSULE | Freq: Once | ORAL | Status: DC

## 2022-09-13 NOTE — Progress Notes (Signed)
Diagnosis: Iron Deficiency Anemia  Provider:  Marshell Garfinkel MD  Procedure: Infusion  IV Type: Peripheral, IV Location: L Antecubital  Venofer (Iron Sucrose), Dose: 300 mg  Infusion Start Time: 0923  Infusion Stop Time: 9030  Post Infusion IV Care: Peripheral IV Discontinued  Discharge: Condition: Good, Destination: Home . AVS provided to patient.   Performed by:  Arnoldo Morale, RN

## 2022-09-20 ENCOUNTER — Ambulatory Visit (INDEPENDENT_AMBULATORY_CARE_PROVIDER_SITE_OTHER): Payer: 59

## 2022-09-20 VITALS — BP 124/80 | HR 63 | Temp 98.3°F | Resp 16 | Ht 72.0 in | Wt 225.2 lb

## 2022-09-20 DIAGNOSIS — D509 Iron deficiency anemia, unspecified: Secondary | ICD-10-CM | POA: Diagnosis not present

## 2022-09-20 MED ORDER — SODIUM CHLORIDE 0.9 % IV SOLN
300.0000 mg | Freq: Once | INTRAVENOUS | Status: AC
  Administered 2022-09-20: 300 mg via INTRAVENOUS
  Filled 2022-09-20: qty 15

## 2022-09-20 MED ORDER — DIPHENHYDRAMINE HCL 25 MG PO CAPS: 25.0000 mg | ORAL_CAPSULE | Freq: Once | ORAL | Status: DC

## 2022-09-20 MED ORDER — ACETAMINOPHEN 325 MG PO TABS: 650.0000 mg | ORAL_TABLET | Freq: Once | ORAL | Status: DC

## 2022-09-20 NOTE — Progress Notes (Signed)
Diagnosis: Marshell Garfinkel MD  Provider:  Marshell Garfinkel MD  Procedure: Infusion  IV Type: Peripheral, IV Location: L Antecubital  Venofer (Iron Sucrose), Dose: 300 mg  Infusion Start Time: 7322  Infusion Stop Time: 5672  Post Infusion IV Care: Peripheral IV Discontinued  Discharge: Condition: Good, Destination: Home . AVS provided to patient.   Performed by:  Koren Shiver, RN

## 2022-09-29 ENCOUNTER — Encounter: Payer: Self-pay | Admitting: Internal Medicine

## 2022-10-05 ENCOUNTER — Ambulatory Visit (AMBULATORY_SURGERY_CENTER): Payer: 59 | Admitting: Internal Medicine

## 2022-10-05 ENCOUNTER — Telehealth: Payer: Self-pay

## 2022-10-05 ENCOUNTER — Encounter: Payer: Self-pay | Admitting: Internal Medicine

## 2022-10-05 VITALS — BP 159/91 | HR 59 | Temp 98.4°F | Resp 20 | Ht 72.0 in | Wt 230.0 lb

## 2022-10-05 DIAGNOSIS — D124 Benign neoplasm of descending colon: Secondary | ICD-10-CM | POA: Diagnosis not present

## 2022-10-05 DIAGNOSIS — K317 Polyp of stomach and duodenum: Secondary | ICD-10-CM | POA: Diagnosis not present

## 2022-10-05 DIAGNOSIS — Z86010 Personal history of colonic polyps: Secondary | ICD-10-CM

## 2022-10-05 DIAGNOSIS — D509 Iron deficiency anemia, unspecified: Secondary | ICD-10-CM | POA: Diagnosis present

## 2022-10-05 DIAGNOSIS — K589 Irritable bowel syndrome without diarrhea: Secondary | ICD-10-CM

## 2022-10-05 DIAGNOSIS — Z860101 Personal history of adenomatous and serrated colon polyps: Secondary | ICD-10-CM

## 2022-10-05 DIAGNOSIS — K649 Unspecified hemorrhoids: Secondary | ICD-10-CM

## 2022-10-05 HISTORY — DX: Personal history of colonic polyps: Z86.010

## 2022-10-05 HISTORY — DX: Personal history of adenomatous and serrated colon polyps: Z86.0101

## 2022-10-05 MED ORDER — SODIUM CHLORIDE 0.9 % IV SOLN: 500.0000 mL | Freq: Once | INTRAVENOUS | Status: DC

## 2022-10-05 NOTE — Patient Instructions (Addendum)
I found several tiny stomach polyps that look innocent - biopsies taken. I removed one tiny colon polyp.  I think your low iron levels are most likely from donating blood and probably never catching up from iron deficiency years ago.  I am referring you to the liver clinic for help with your abnormal liver biopsy from 2022.  I will let you know pathology results and when to have another routine endoscopy and/or colonoscopy by mail and/or My Chart.  I appreciate the opportunity to care for you. Gatha Mayer, MD, [facility]  Polyp information sent home with you today.  No cause of iron deficiency found in either the colonoscopy or the EGD.  Stop blood donations indefinitely.  Will refer to [facility] re: granulomatous hepatitis.  Await pathology results.  Resume previous diet and medications.  YOU HAD AN ENDOSCOPIC PROCEDURE TODAY AT [facility]:   Refer to the procedure report that was given to you for any specific questions about what was found during the examination.  If the procedure report does not answer your questions, please call your gastroenterologist to clarify.  If you requested that your care partner not be given the details of your procedure findings, then the procedure report has been included in a sealed envelope for you to review at your convenience later.  YOU SHOULD EXPECT: Some feelings of bloating in the abdomen. Passage of more gas than usual.  Walking can help get rid of the air that was put into your GI tract during the procedure and reduce the bloating. If you had a lower endoscopy (such as a colonoscopy or flexible sigmoidoscopy) you may notice spotting of blood in your stool or on the toilet paper. If you underwent a bowel prep for your procedure, you may not have a normal bowel movement for a few days.  Please Note:  You might notice some irritation and congestion in your nose or some drainage.  This is from the oxygen used during your  procedure.  There is no need for concern and it should clear up in a day or so.  SYMPTOMS TO REPORT IMMEDIATELY:  Following lower endoscopy (colonoscopy or flexible sigmoidoscopy):  Excessive amounts of blood in the stool  Significant tenderness or worsening of abdominal pains  Swelling of the abdomen that is new, acute  Fever of 100F or higher  Following upper endoscopy (EGD)  Vomiting of blood or coffee ground material  New chest pain or pain under the shoulder blades  Painful or persistently difficult swallowing  New shortness of breath  Fever of 100F or higher  Black, tarry-looking stools  For urgent or emergent issues, a gastroenterologist can be reached at any hour by calling [phone]. Do not use MyChart messaging for urgent concerns.    DIET:  We do recommend a small meal at first, but then you may proceed to your regular diet.  Drink plenty of fluids but you should avoid alcoholic beverages for 24 hours.  ACTIVITY:  You should plan to take it easy for the rest of today and you should NOT DRIVE or use heavy machinery until tomorrow (because of the sedation medicines used during the test).    FOLLOW UP: Our staff will call the number listed on your records the next business day following your procedure.  We will call around 7:15- 8:00 am to check on you and address any questions or concerns that you may have regarding the information given to you following your procedure.  If we do not reach you, we will leave a message.     If any biopsies were taken you will be contacted by phone or by letter within the next 1-3 weeks.  Please call us at [phone] if you have not heard about the biopsies in 3 weeks.    SIGNATURES/CONFIDENTIALITY: You and/or your care partner have signed paperwork which will be entered into your electronic medical record.  These signatures attest to the fact that that the information above on your After Visit Summary has been reviewed and is  understood.  Full responsibility of the confidentiality of this discharge information lies with you and/or your care-partner.

## 2022-10-05 NOTE — Op Note (Addendum)
[place] Patient Name: Christopher [name] Procedure Date: 10/05/2022 1:22 PM MRN: [id number] Endoscopist: Gatha Mayer , MD, [id number] Age: 46 Referring MD:  Date of Birth: Dec 30, 1975 Gender: Male Account #: [id number] Procedure:                Colonoscopy Indications:              Iron deficiency anemia Medicines:                Monitored Anesthesia Care Procedure:                Pre-Anesthesia Assessment:                           - Prior to the procedure, a History and Physical                            was performed, and patient medications and                            allergies were reviewed. The patient's tolerance of                            previous anesthesia was also reviewed. The risks                            and benefits of the procedure and the sedation                            options and risks were discussed with the patient.                            All questions were answered, and informed consent                            was obtained. Prior Anticoagulants: The patient has                            taken no anticoagulant or antiplatelet agents. ASA                            Grade Assessment: II - A patient with mild systemic                            disease. After reviewing the risks and benefits,                            the patient was deemed in satisfactory condition to                            undergo the procedure.                           After obtaining informed consent, the colonoscope  was passed under direct vision. Throughout the                            procedure, the patient's blood pressure, pulse, and                            oxygen saturations were monitored continuously. The                            CF [id number] #[id number] was introduced through the anus                            and advanced to the the cecum, identified by                            appendiceal orifice and ileocecal valve.  The                            colonoscopy was performed without difficulty. The                            patient tolerated the procedure well. The quality                            of the bowel preparation was excellent. The                            ileocecal valve, appendiceal orifice, and rectum                            were photographed. Scope In: 2:00:35 PM Scope Out: 2:14:10 PM Scope Withdrawal Time: 0 hours 8 minutes 32 seconds  Total Procedure Duration: 0 hours 13 minutes 35 seconds  Findings:                 The perianal and digital rectal examinations were                            normal. Pertinent negatives include normal prostate                            (size, shape, and consistency).                           A diminutive polyp was found in the descending                            colon. The polyp was sessile. The polyp was removed                            with a cold snare. Resection and retrieval were                            complete. Verification of patient identification  for the specimen was done. Estimated blood loss was                            minimal.                           Internal hemorrhoids were found.                           The exam was otherwise without abnormality on                            direct and retroflexion views. Complications:            No immediate complications. Estimated Blood Loss:     Estimated blood loss was minimal. Impression:               - One diminutive polyp in the descending colon,                            removed with a cold snare. Resected and retrieved.                           - Internal hemorrhoids.                           - The examination was otherwise normal on direct                            and retroflexion views. Recommendation:           - Patient has a contact number available for                            emergencies. The signs and symptoms of potential                             delayed complications were discussed with the                            patient. Return to normal activities tomorrow.                            Written discharge instructions were provided to the                            patient.                           - Resume previous diet.                           - Continue present medications.                           - Await pathology results.                           -  Repeat colonoscopy is recommended for                            surveillance. The colonoscopy date will be                            determined after pathology results from today's                            exam become available for review.                           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

## 2022-10-05 NOTE — Telephone Encounter (Signed)
Referral along with Pt records were sent to  [facility] in [place] [facility]) Faxed To [phone]: Confirmation page received:

## 2022-10-05 NOTE — Telephone Encounter (Signed)
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

## 2022-10-05 NOTE — Op Note (Signed)
[facility] Patient Name: Christopher [name] Procedure Date: 10/05/2022 1:23 PM MRN: [id number] Endoscopist: Gatha Mayer , MD, [id number] Age: 46 Referring MD:  Date of Birth: [birth year] Gender: Male Account #: [id number] Procedure:                Upper GI endoscopy Indications:              Iron deficiency anemia Medicines:                Monitored Anesthesia Care Procedure:                Pre-Anesthesia Assessment:                           - Prior to the procedure, a History and Physical                            was performed, and patient medications and                            allergies were reviewed. The patient's tolerance of                            previous anesthesia was also reviewed. The risks                            and benefits of the procedure and the sedation                            options and risks were discussed with the patient.                            All questions were answered, and informed consent                            was obtained. Prior Anticoagulants: The patient has                            taken no anticoagulant or antiplatelet agents. ASA                            Grade Assessment: II - A patient with mild systemic                            disease. After reviewing the risks and benefits,                            the patient was deemed in satisfactory condition to                            undergo the procedure.                           After obtaining informed consent, the endoscope was  passed under direct vision. Throughout the                            procedure, the patient's blood pressure, pulse, and                            oxygen saturations were monitored continuously. The                            GIF [id number] #[id number] was introduced through the                            mouth, and advanced to the second part of duodenum.                            The upper GI endoscopy was  accomplished without                            difficulty. The patient tolerated the procedure                            well. Scope In: Scope Out: Findings:                 The examined esophagus was normal.                           Multiple diminutive sessile polyps with no stigmata                            of recent bleeding were found in the gastric body.                            Biopsies were taken with a cold forceps for                            histology. Verification of patient identification                            for the specimen was done. Estimated blood loss was                            minimal.                           The examined duodenum was normal.                           The cardia and gastric fundus were normal on                            retroflexion. Complications:            No immediate complications. Estimated Blood Loss:     Estimated blood loss was minimal. Impression:               -  Normal esophagus.                           - Multiple gastric polyps. Biopsied.                           - Normal examined duodenum.                           - No cause of iron deficiency found (celiac                            serology previously negative) Recommendation:           - Patient has a contact number available for                            emergencies. The signs and symptoms of potential                            delayed complications were discussed with the                            patient. Return to normal activities tomorrow.                            Written discharge instructions were provided to the                            patient.                           - Resume previous diet.                           - Continue present medications.                           - Await pathology results.                           - See the other procedure note for documentation of                            additional recommendations. Gatha Mayer, MD 10/05/2022 2:24:37 PM This report has been signed electronically.

## 2022-10-05 NOTE — Progress Notes (Signed)
Called to room to assist during endoscopic procedure.  Patient ID and intended procedure confirmed with present staff. Received instructions for my participation in the procedure from the performing physician.  

## 2022-10-05 NOTE — Progress Notes (Signed)
To pacu, VSS. Report to rn.tb °

## 2022-10-05 NOTE — Progress Notes (Signed)
[facility] History and Physical   Primary Care Physician:  [name], MD   Reason for Procedure:   Iron deficiency anemia  Plan:    EGD, colonoscopy     HPI: Christopher [name] is a 46 y.o. male here for evaluation of iron deficiency anemia He was seen 08/23/22 in our clinic and these procedures were scheduled. Hospitalized in October for pancreatitis  Past Medical History:  Diagnosis Date   Allergy    Anemia    GERD (gastroesophageal reflux disease)    H/O hiatal hernia    Hepatic steatosis    Hyperlipidemia    Hypertension    IBS (irritable bowel syndrome)    Kidney cyst, acquired    Migraine    "probably monthly" (07/22/2014)   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/02/2014   Pain in lower limb 03/02/2014   Pancreatitis 09/04/2022    Past Surgical History:  Procedure Laterality Date   CARPAL TUNNEL RELEASE Right 2008   COLONOSCOPY     IBS   CYST EXCISION Right 2015   "2nd toe"   ESOPHAGOGASTRODUODENOSCOPY     KNEE ARTHROSCOPY Right 1992   LASIK     REFRACTIVE SURGERY Bilateral 2013   SHOULDER SURGERY  10/2016   UPPER GI ENDOSCOPY     chest pain   WRIST FRACTURE SURGERY Right 2008   "got plate and screws"    Prior to Admission medications   Medication Sig Start Date End Date Taking? Authorizing Provider  amLODipine (NORVASC) 10 MG tablet Take 1 tablet (10 mg total) by mouth daily. Patient taking differently: Take 10 mg by mouth daily with lunch. 12/16/21  Yes Burns, Claudina Lick, MD  fenofibrate (TRICOR) 145 MG tablet TAKE 1 TABLET BY MOUTH  DAILY Patient taking differently: Take 145 mg by mouth daily with lunch. 08/28/22  Yes Burns, Claudina Lick, MD  pantoprazole (PROTONIX) 40 MG tablet TAKE 1 TABLET BY MOUTH  DAILY Patient taking differently: Take 40 mg by mouth daily before lunch. 08/28/22  Yes Burns, Claudina Lick, MD  tadalafil (CIALIS) 20 MG tablet Take 20 mg by mouth daily as needed. 10/15/21  Yes [provider]  valsartan-hydrochlorothiazide (DIOVAN-HCT) 320-25  MG tablet TAKE 1 TABLET BY MOUTH  DAILY Patient taking differently: Take 1 tablet by mouth daily with lunch. 10/17/21  Yes Burns, Claudina Lick, MD  Multiple Vitamin (MULTIVITAMIN WITH MINERALS) TABS tablet Take 1 tablet by mouth daily.    [provider]    Current Outpatient Medications  Medication Sig Dispense Refill   amLODipine (NORVASC) 10 MG tablet Take 1 tablet (10 mg total) by mouth daily. (Patient taking differently: Take 10 mg by mouth daily with lunch.) 90 tablet 3   fenofibrate (TRICOR) 145 MG tablet TAKE 1 TABLET BY MOUTH  DAILY (Patient taking differently: Take 145 mg by mouth daily with lunch.) 90 tablet 3   pantoprazole (PROTONIX) 40 MG tablet TAKE 1 TABLET BY MOUTH  DAILY (Patient taking differently: Take 40 mg by mouth daily before lunch.) 90 tablet 3   tadalafil (CIALIS) 20 MG tablet Take 20 mg by mouth daily as needed.     valsartan-hydrochlorothiazide (DIOVAN-HCT) 320-25 MG tablet TAKE 1 TABLET BY MOUTH  DAILY (Patient taking differently: Take 1 tablet by mouth daily with lunch.) 90 tablet 3   Multiple Vitamin (MULTIVITAMIN WITH MINERALS) TABS tablet Take 1 tablet by mouth daily.     Current Facility-Administered Medications  Medication Dose Route Frequency Provider Last Rate Last Admin   0.9 %  sodium  chloride infusion  500 mL Intravenous Once Gatha Mayer, MD        Allergies as of 10/05/2022 - Review Complete 10/05/2022  Allergen Reaction Noted   Morphine and related Nausea And Vomiting and Other (See Comments) 04/26/2016    Family History  Problem Relation Age of Onset   Hypertension Mother    Hyperlipidemia Mother    Depression Mother    Hypertension Father    Depression Sister    Heart disease Maternal Uncle    Alzheimer's disease Maternal Grandmother    Alcohol abuse Maternal Grandfather    Heart disease Maternal Grandfather    Heart failure Maternal Grandfather    Alzheimer's disease Paternal Grandmother    Alcohol abuse Paternal Grandfather     Throat cancer Paternal Grandfather    Colon cancer Neg Hx    Rectal cancer Neg Hx     Social History   Socioeconomic History   Marital status: Single    Spouse name: Not on file   Number of children: 2   Years of education: Not on file   Highest education level: Not on file  Occupational History   Occupation: ENGINEERING SPEALIST    Employer: [employer]  Tobacco Use   Smoking status: Never   Smokeless tobacco: Never   Tobacco comments:    Occasional cigar. 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[place]  3 caffeinated beverages daily   Alcohol several at a time but not every day maybe once or twice a week never smoker no other tobacco no drug use   Social Determinants of HRadio broadcast assistantStrain: Not on file  Food Insecurity: No Food Insecurity (09/04/2022)   Hunger Vital Sign    Worried About Running Out of Food in the Last Year: Never true    RAckleyin the Last Year: Never true  Transportation Needs: No Transportation Needs (09/04/2022)   PRAPARE - THydrologist(Medical): No    Lack of Transportation (Non-Medical): No  Physical Activity: Not on file  Stress: Not on file  Social Connections: Not on file  Intimate Partner Violence: Not At Risk (09/04/2022)   Humiliation, Afraid, Rape, and Kick questionnaire    Fear of Current or Ex-Partner: No    Emotionally Abused: No    Physically Abused: No    Sexually Abused: No    Review of Systems:  All other review of systems negative except as mentioned in the HPI.  Physical Exam: Vital signs BP (!) 137/93   Pulse 60    Temp 98.4 F (36.9 C)   Ht 6' (1.829 m)   Wt 230 lb (104.3 kg)   SpO2 97%   BMI 31.19 kg/m   General:   Alert,  Well-developed, well-nourished, pleasant and cooperative in NAD Lungs:  Clear throughout to auscultation.   Heart:  Regular rate and rhythm; no murmurs, clicks, rubs,  or gallops. Abdomen:  Soft, nontender and nondistended. Normal bowel sounds.   Neuro/Psych:  Alert  and cooperative. Normal mood and affect. A and O x 3   '@Tighe Gitto'$  Simonne Maffucci, MD, [facility] [phone] (pager) 10/05/2022 1:41 PM@

## 2022-10-05 NOTE — Progress Notes (Signed)
Pt's states no medical or surgical changes since previsit or office visit. 

## 2022-10-06 ENCOUNTER — Telehealth: Payer: Self-pay

## 2022-10-06 NOTE — Telephone Encounter (Signed)
Attempted to reach pt. With follow-up call following endoscopic procedure 10/05/2022.  LM on pt. Voice mail to call if he has any questions or concerns.

## 2022-10-17 ENCOUNTER — Telehealth: Payer: Self-pay | Admitting: Internal Medicine

## 2022-10-17 NOTE — Telephone Encounter (Signed)
Called patient regarding December appointment, patient has been called and notified. 

## 2022-10-18 ENCOUNTER — Encounter: Payer: Self-pay | Admitting: Internal Medicine

## 2022-11-14 ENCOUNTER — Inpatient Hospital Stay (HOSPITAL_BASED_OUTPATIENT_CLINIC_OR_DEPARTMENT_OTHER): Payer: 59 | Admitting: Internal Medicine

## 2022-11-14 ENCOUNTER — Inpatient Hospital Stay: Payer: 59 | Attending: Physician Assistant

## 2022-11-14 VITALS — BP 136/87 | HR 75 | Temp 97.9°F | Resp 16 | Wt 231.5 lb

## 2022-11-14 DIAGNOSIS — D500 Iron deficiency anemia secondary to blood loss (chronic): Secondary | ICD-10-CM | POA: Diagnosis not present

## 2022-11-14 DIAGNOSIS — D509 Iron deficiency anemia, unspecified: Secondary | ICD-10-CM | POA: Diagnosis present

## 2022-11-14 LAB — CBC WITH DIFFERENTIAL (CANCER CENTER ONLY)
Abs Immature Granulocytes: 0.02 10*3/uL (ref 0.00–0.07)
Basophils Absolute: 0 10*3/uL (ref 0.0–0.1)
Basophils Relative: 1 %
Eosinophils Absolute: 0.3 10*3/uL (ref 0.0–0.5)
Eosinophils Relative: 5 %
HCT: 40.9 % (ref 39.0–52.0)
Hemoglobin: 14.3 g/dL (ref 13.0–17.0)
Immature Granulocytes: 0 %
Lymphocytes Relative: 26 %
Lymphs Abs: 1.2 10*3/uL (ref 0.7–4.0)
MCH: 29.7 pg (ref 26.0–34.0)
MCHC: 35 g/dL (ref 30.0–36.0)
MCV: 84.9 fL (ref 80.0–100.0)
Monocytes Absolute: 0.6 10*3/uL (ref 0.1–1.0)
Monocytes Relative: 13 %
Neutro Abs: 2.6 10*3/uL (ref 1.7–7.7)
Neutrophils Relative %: 55 %
Platelet Count: 287 10*3/uL (ref 150–400)
RBC: 4.82 MIL/uL (ref 4.22–5.81)
RDW: 14 % (ref 11.5–15.5)
WBC Count: 4.7 10*3/uL (ref 4.0–10.5)
nRBC: 0 % (ref 0.0–0.2)

## 2022-11-14 LAB — IRON AND IRON BINDING CAPACITY (CC-WL,HP ONLY)
Iron: 116 ug/dL (ref 45–182)
Saturation Ratios: 25 % (ref 17.9–39.5)
TIBC: 468 ug/dL — ABNORMAL HIGH (ref 250–450)
UIBC: 352 ug/dL (ref 117–376)

## 2022-11-14 LAB — FERRITIN: Ferritin: 68 ng/mL (ref 24–336)

## 2022-11-14 NOTE — Progress Notes (Signed)
[place] Telephone:([phone]   [phone]  OFFICE PROGRESS NOTE  [name], MD [place] [id number]  DIAGNOSIS: Iron deficiency anemia likely from gastrointestinal blood loss  PRIOR THERAPY: Status post iron infusion with Venofer 300 Mg IV weekly for 3 weeks.  CURRENT THERAPY: None  INTERVAL HISTORY: Christopher [name] 46 y.o. male returns to the clinic today for follow-up visit.  The patient is feeling fine today with no concerning complaints.  He tolerated the iron infusion fairly well and he felt much better.  He was also seen by gastroenterology and had upper endoscopy and colonoscopy that were unremarkable except for some internal hemorrhoids and small polyps.  He denied having any current chest pain, shortness of breath, cough or hemoptysis.  He has no nausea, vomiting, diarrhea or constipation.  He has no headache or visual changes.  He denied having any weight loss or night sweats.  He is here today for evaluation and repeat blood work.  MEDICAL HISTORY: Past Medical History:  Diagnosis Date   Allergy    Anemia    GERD (gastroesophageal reflux disease)    H/O hiatal hernia    Hepatic steatosis    Hx of adenomatous polyp of colon 10/05/2022   diminutive repeat colonoscopy 2033   Hyperlipidemia    Hypertension    IBS (irritable bowel syndrome)    Kidney cyst, acquired    Migraine    "probably monthly" (07/22/2014)   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/02/2014   Pain in lower limb 03/02/2014   Pancreatitis 09/04/2022    ALLERGIES:  is allergic to morphine and related.  MEDICATIONS:  Current Outpatient Medications  Medication Sig Dispense Refill   amLODipine (NORVASC) 10 MG tablet Take 1 tablet (10 mg total) by mouth daily. (Patient taking differently: Take 10 mg by mouth daily with lunch.) 90 tablet 3   fenofibrate (TRICOR) 145 MG tablet TAKE 1 TABLET BY MOUTH  DAILY (Patient taking differently: Take 145 mg by mouth daily with  lunch.) 90 tablet 3   Multiple Vitamin (MULTIVITAMIN WITH MINERALS) TABS tablet Take 1 tablet by mouth daily.     pantoprazole (PROTONIX) 40 MG tablet TAKE 1 TABLET BY MOUTH  DAILY (Patient taking differently: Take 40 mg by mouth daily before lunch.) 90 tablet 3   tadalafil (CIALIS) 20 MG tablet Take 20 mg by mouth daily as needed.     valsartan-hydrochlorothiazide (DIOVAN-HCT) 320-25 MG tablet TAKE 1 TABLET BY MOUTH  DAILY (Patient taking differently: Take 1 tablet by mouth daily with lunch.) 90 tablet 3   No current facility-administered medications for this visit.    SURGICAL HISTORY:  Past Surgical History:  Procedure Laterality Date   CARPAL TUNNEL RELEASE Right 2008   COLONOSCOPY     IBS   CYST EXCISION Right 2015   "2nd toe"   ESOPHAGOGASTRODUODENOSCOPY     KNEE ARTHROSCOPY Right 1992   LASIK     REFRACTIVE SURGERY Bilateral 2013   SHOULDER SURGERY  10/2016   UPPER GI ENDOSCOPY     chest pain   WRIST FRACTURE SURGERY Right 2008   "got plate and screws"    REVIEW OF SYSTEMS:  A comprehensive review of systems was negative.   PHYSICAL EXAMINATION: General appearance: alert, cooperative, and no distress Head: Normocephalic, without obvious abnormality, atraumatic Neck: no adenopathy, no JVD, supple, symmetrical, trachea midline, and thyroid not enlarged, symmetric, no tenderness/mass/nodules Lymph nodes: Cervical, supraclavicular, and axillary nodes normal. Resp: clear  to auscultation bilaterally Back: symmetric, no curvature. ROM normal. No CVA tenderness. Cardio: regular rate and rhythm, S1, S2 normal, no murmur, click, rub or gallop GI: soft, non-tender; bowel sounds normal; no masses,  no organomegaly Extremities: extremities normal, atraumatic, no cyanosis or edema  ECOG PERFORMANCE STATUS: 0 - Asymptomatic  Blood pressure 136/87, pulse 75, temperature 97.9 F (36.6 C), temperature source Oral, resp. rate 16, weight 231 lb 8 oz (105 kg), SpO2 97 %.  LABORATORY  DATA: Lab Results  Component Value Date   WBC 4.7 11/14/2022   HGB 14.3 11/14/2022   HCT 40.9 11/14/2022   MCV 84.9 11/14/2022   PLT 287 11/14/2022      Chemistry      Component Value Date/Time   NA 133 (L) 09/08/2022 0516   NA 139 07/08/2020 0938   K 3.3 (L) 09/08/2022 0516   CL 103 09/08/2022 0516   CO2 22 09/08/2022 0516   BUN 9 09/08/2022 0516   BUN 11 07/08/2020 0938   CREATININE 0.82 09/08/2022 0516   CREATININE 1.16 08/15/2022 1253      Component Value Date/Time   CALCIUM 7.9 (L) 09/08/2022 0516   ALKPHOS 33 (L) 09/08/2022 0516   AST 23 09/08/2022 0516   AST 34 08/15/2022 1253   ALT 29 09/08/2022 0516   ALT 49 (H) 08/15/2022 1253   BILITOT 1.2 09/08/2022 0516   BILITOT 0.6 08/15/2022 1253       RADIOGRAPHIC STUDIES: No results found.  ASSESSMENT AND PLAN: This is a very pleasant 46 years old white male with history of iron deficiency anemia that was likely secondary to gastrointestinal blood loss probably from his internal hemorrhoids. The patient had workup by his gastroenterologist that was unremarkable except for the internal hemorrhoids and small polyps. He was treated with iron infusion with Venofer 300 Mg IV weekly for 3 weeks and is feeling much better. Repeat CBC today showed significant improvement in his hemoglobin and hematocrit.  His hemoglobin is 14.3 and hematocrit 40.9%.  Iron study showed normal serum iron of 116 with iron saturation of 25%.  Ferritin level still pending I discussed the lab result with the patient today and recommended for him to continue on observation and close monitoring by his primary care physician from now on.   I will see the patient on as-needed basis in the future if there is any concerning hematologic abnormalities. The patient was advised to call immediately if he has any other concerning symptoms. The patient voices understanding of current disease status and treatment options and is in agreement with the current care  plan.  All questions were answered. The patient knows to call the clinic with any problems, questions or concerns. We can certainly see the patient much sooner if necessary.  The total time spent in the appointment was 20 minutes.  Disclaimer: This note was dictated with voice recognition software. Similar sounding words can inadvertently be transcribed and may not be corrected upon review.

## 2022-11-21 ENCOUNTER — Encounter: Payer: Self-pay | Admitting: Internal Medicine

## 2022-12-09 ENCOUNTER — Other Ambulatory Visit: Payer: Self-pay | Admitting: Nurse Practitioner

## 2022-12-09 DIAGNOSIS — K753 Granulomatous hepatitis, not elsewhere classified: Secondary | ICD-10-CM

## 2022-12-19 ENCOUNTER — Encounter: Payer: Self-pay | Admitting: Internal Medicine

## 2022-12-19 NOTE — Patient Instructions (Addendum)
Blood work was ordered.   The lab is on the first floor.    Medications changes include :   none     Return in about 6 months (around 06/20/2023) for follow up.    Health Maintenance, Male Adopting a healthy lifestyle and getting preventive care are important in promoting health and wellness. Ask your health care provider about: The right schedule for you to have regular tests and exams. Things you can do on your own to prevent diseases and keep yourself healthy. What should I know about diet, weight, and exercise? Eat a healthy diet  Eat a diet that includes plenty of vegetables, fruits, low-fat dairy products, and lean protein. Do not eat a lot of foods that are high in solid fats, added sugars, or sodium. Maintain a healthy weight Body mass index (BMI) is a measurement that can be used to identify possible weight problems. It estimates body fat based on height and weight. Your health care provider can help determine your BMI and help you achieve or maintain a healthy weight. Get regular exercise Get regular exercise. This is one of the most important things you can do for your health. Most adults should: Exercise for at least 150 minutes each week. The exercise should increase your heart rate and make you sweat (moderate-intensity exercise). Do strengthening exercises at least twice a week. This is in addition to the moderate-intensity exercise. Spend less time sitting. Even light physical activity can be beneficial. Watch cholesterol and blood lipids Have your blood tested for lipids and cholesterol at 49 years of age, then have this test every 5 years. You may need to have your cholesterol levels checked more often if: Your lipid or cholesterol levels are high. You are older than 48 years of age. You are at high risk for heart disease. What should I know about cancer screening? Many types of cancers can be detected early and may often be prevented. Depending on your  health history and family history, you may need to have cancer screening at various ages. This may include screening for: Colorectal cancer. Prostate cancer. Skin cancer. Lung cancer. What should I know about heart disease, diabetes, and high blood pressure? Blood pressure and heart disease High blood pressure causes heart disease and increases the risk of stroke. This is more likely to develop in people who have high blood pressure readings or are overweight. Talk with your health care provider about your target blood pressure readings. Have your blood pressure checked: Every 3-5 years if you are 77-1 years of age. Every year if you are 61 years old or older. If you are between the ages of 74 and 31 and are a current or former smoker, ask your health care provider if you should have a one-time screening for abdominal aortic aneurysm (AAA). Diabetes Have regular diabetes screenings. This checks your fasting blood sugar level. Have the screening done: Once every three years after age 65 if you are at a normal weight and have a low risk for diabetes. More often and at a younger age if you are overweight or have a high risk for diabetes. What should I know about preventing infection? Hepatitis B If you have a higher risk for hepatitis B, you should be screened for this virus. Talk with your health care provider to find out if you are at risk for hepatitis B infection. Hepatitis C Blood testing is recommended for: Everyone born from [birth year] through 1965. Anyone with known  risk factors for hepatitis C. Sexually transmitted infections (STIs) You should be screened each year for STIs, including gonorrhea and chlamydia, if: You are sexually active and are younger than 48 years of age. You are older than 48 years of age and your health care provider tells you that you are at risk for this type of infection. Your sexual activity has changed since you were last screened, and you are at increased risk  for chlamydia or gonorrhea. Ask your health care provider if you are at risk. Ask your health care provider about whether you are at high risk for HIV. Your health care provider may recommend a prescription medicine to help prevent HIV infection. If you choose to take medicine to prevent HIV, you should first get tested for HIV. You should then be tested every 3 months for as long as you are taking the medicine. Follow these instructions at home: Alcohol use Do not drink alcohol if your health care provider tells you not to drink. If you drink alcohol: Limit how much you have to 0-2 drinks a day. Know how much alcohol is in your drink. In the [place] one drink equals one 12 oz bottle of beer (355 mL), one 5 oz glass of wine (148 mL), or one 1 oz glass of hard liquor (44 mL). Lifestyle Do not use any products that contain nicotine or tobacco. These products include cigarettes, chewing tobacco, and vaping devices, such as e-cigarettes. If you need help quitting, ask your health care provider. Do not use street drugs. Do not share needles. Ask your health care provider for help if you need support or information about quitting drugs. General instructions Schedule regular health, dental, and eye exams. Stay current with your vaccines. Tell your health care provider if: You often feel depressed. You have ever been abused or do not feel safe at home. Summary Adopting a healthy lifestyle and getting preventive care are important in promoting health and wellness. Follow your health care provider's instructions about healthy diet, exercising, and getting tested or screened for diseases. Follow your health care provider's instructions on monitoring your cholesterol and blood pressure. This information is not intended to replace advice given to you by your health care provider. Make sure you discuss any questions you have with your health care provider. Document Revised: 04/04/2021 Document Reviewed:  04/04/2021 Elsevier Patient Education  [place].

## 2022-12-19 NOTE — Progress Notes (Unsigned)
Subjective:    Patient ID: Christopher [name], male    DOB: 11/10/1976, 47 y.o.   MRN: [id number]     HPI Christopher [name] is here for a physical exam.   Saw GI at [facility] - to have MRI of liver to evaluate lesion.     Has been having some chest pain with laying down - goes away when he lays down.  Has taken tums and that helps.    Medications and allergies reviewed with patient and updated if appropriate.  Current Outpatient Medications on File Prior to Visit  Medication Sig Dispense Refill   amLODipine (NORVASC) 10 MG tablet Take 1 tablet (10 mg total) by mouth daily. (Patient taking differently: Take 10 mg by mouth daily with lunch.) 90 tablet 3   fenofibrate (TRICOR) 145 MG tablet TAKE 1 TABLET BY MOUTH  DAILY (Patient taking differently: Take 145 mg by mouth daily with lunch.) 90 tablet 3   Multiple Vitamin (MULTIVITAMIN WITH MINERALS) TABS tablet Take 1 tablet by mouth daily.     pantoprazole (PROTONIX) 40 MG tablet TAKE 1 TABLET BY MOUTH  DAILY (Patient taking differently: Take 40 mg by mouth daily before lunch.) 90 tablet 3   tadalafil (CIALIS) 20 MG tablet Take 20 mg by mouth daily as needed.     valsartan-hydrochlorothiazide (DIOVAN-HCT) 320-25 MG tablet TAKE 1 TABLET BY MOUTH  DAILY (Patient taking differently: Take 1 tablet by mouth daily with lunch.) 90 tablet 3   No current facility-administered medications on file prior to visit.    Review of Systems  Constitutional:  Negative for fever.  Eyes:  Negative for visual disturbance.  Respiratory:  Negative for cough, shortness of breath and wheezing.   Cardiovascular:  Positive for chest pain (when laying down only - GERD - none with exercise) and leg swelling (mild). Negative for palpitations.  Gastrointestinal:  Positive for diarrhea. Negative for abdominal pain, blood in stool, constipation and nausea.       No gerd during day - only when laying down at night  Genitourinary:  Negative for difficulty urinating, dysuria and  hematuria.  Musculoskeletal:  Negative for arthralgias and back pain.  Skin:  Negative for rash.  Neurological:  Negative for light-headedness and headaches.  Psychiatric/Behavioral:  Negative for dysphoric mood. The patient is not nervous/anxious.        Objective:   Vitals:   12/20/22 0753  BP: 120/74  Pulse: 70  Temp: 98 F (36.7 C)  SpO2: 96%   Filed Weights   12/20/22 0753  Weight: 230 lb (104.3 kg)   Body mass index is 31.19 kg/m.  BP Readings from Last 3 Encounters:  12/20/22 120/74  11/14/22 136/87  10/05/22 (!) 159/91    Wt Readings from Last 3 Encounters:  12/20/22 230 lb (104.3 kg)  11/14/22 231 lb 8 oz (105 kg)  10/05/22 230 lb (104.3 kg)      Physical Exam Constitutional: He appears well-developed and well-nourished. No distress.  HENT:  Head: Normocephalic and atraumatic.  Right Ear: External ear normal.  Left Ear: External ear normal.  Mouth/Throat: Oropharynx is clear and moist.  Normal ear canals and TM b/l  Eyes: Conjunctivae and EOM are normal.  Neck: Neck supple. No tracheal deviation present. No thyromegaly present.  No carotid bruit  Cardiovascular: Normal rate, regular rhythm, normal heart sounds and intact distal pulses.   No murmur heard. Pulmonary/Chest: Effort normal and breath sounds normal. No respiratory distress. He has no wheezes. He has no  rales.  Abdominal: Soft. He exhibits no distension. There is no tenderness.  Genitourinary: deferred  Musculoskeletal: He exhibits no edema.  Lymphadenopathy:   He has no cervical adenopathy.  Skin: Skin is warm and dry. He is not diaphoretic.  Psychiatric: He has a normal mood and affect. His behavior is normal.         Assessment & Plan:   Physical exam: Screening blood work  ordered Exercise   going to gym Weight  advised weight loss Substance abuse   still has a drink here and there - but less regular Eating healthier - trying to improve daytime meals   Reviewed  recommended immunizations.   Health Maintenance  Topic Date Due   INFLUENZA VACCINE  06/27/2022   COVID-19 Vaccine (3 - 2023-24 season) 07/28/2022   DTaP/Tdap/Td (3 - Td or Tdap) 06/12/2024   COLONOSCOPY (Pts 45-66yr Insurance coverage will need to be confirmed)  10/05/2032   Hepatitis C Screening  Completed   HIV Screening  Completed   HPV VACCINES  Aged Out     See Problem List for Assessment and Plan of chronic medical problems.

## 2022-12-20 ENCOUNTER — Ambulatory Visit (INDEPENDENT_AMBULATORY_CARE_PROVIDER_SITE_OTHER): Payer: 59 | Admitting: Internal Medicine

## 2022-12-20 VITALS — BP 120/74 | HR 70 | Temp 98.0°F | Ht 72.0 in | Wt 230.0 lb

## 2022-12-20 DIAGNOSIS — R0789 Other chest pain: Secondary | ICD-10-CM

## 2022-12-20 DIAGNOSIS — D509 Iron deficiency anemia, unspecified: Secondary | ICD-10-CM

## 2022-12-20 DIAGNOSIS — Z0000 Encounter for general adult medical examination without abnormal findings: Secondary | ICD-10-CM

## 2022-12-20 DIAGNOSIS — R739 Hyperglycemia, unspecified: Secondary | ICD-10-CM

## 2022-12-20 DIAGNOSIS — I10 Essential (primary) hypertension: Secondary | ICD-10-CM | POA: Diagnosis not present

## 2022-12-20 DIAGNOSIS — K219 Gastro-esophageal reflux disease without esophagitis: Secondary | ICD-10-CM

## 2022-12-20 DIAGNOSIS — E781 Pure hyperglyceridemia: Secondary | ICD-10-CM

## 2022-12-20 LAB — COMPREHENSIVE METABOLIC PANEL
ALT: 75 U/L — ABNORMAL HIGH (ref 0–53)
AST: 49 U/L — ABNORMAL HIGH (ref 0–37)
Albumin: 4.4 g/dL (ref 3.5–5.2)
Alkaline Phosphatase: 36 U/L — ABNORMAL LOW (ref 39–117)
BUN: 16 mg/dL (ref 6–23)
CO2: 29 mEq/L (ref 19–32)
Calcium: 9.4 mg/dL (ref 8.4–10.5)
Chloride: 101 mEq/L (ref 96–112)
Creatinine, Ser: 1.2 mg/dL (ref 0.40–1.50)
GFR: 72.14 mL/min (ref >60.00)
Glucose, Bld: 100 mg/dL — ABNORMAL HIGH (ref 70–99)
Potassium: 3.7 mEq/L (ref 3.5–5.1)
Sodium: 140 mEq/L (ref 135–145)
Total Bilirubin: 0.8 mg/dL (ref 0.2–1.2)
Total Protein: 7.3 g/dL (ref 6.0–8.3)

## 2022-12-20 LAB — LIPID PANEL
Cholesterol: 161 mg/dL (ref 0–200)
HDL: 40.9 mg/dL (ref >39.00)
LDL Cholesterol: 91 mg/dL (ref 0–99)
NonHDL: 120.15
Total CHOL/HDL Ratio: 4
Triglycerides: 146 mg/dL (ref 0.0–149.0)
VLDL: 29.2 mg/dL (ref 0.0–40.0)

## 2022-12-20 LAB — CBC WITH DIFFERENTIAL/PLATELET
Basophils Absolute: 0 10*3/uL (ref 0.0–0.1)
Basophils Relative: 0.8 % (ref 0.0–3.0)
Eosinophils Absolute: 0.3 10*3/uL (ref 0.0–0.7)
Eosinophils Relative: 7.1 % — ABNORMAL HIGH (ref 0.0–5.0)
HCT: 41.2 % (ref 39.0–52.0)
Hemoglobin: 14.2 g/dL (ref 13.0–17.0)
Lymphocytes Relative: 29.8 % (ref 12.0–46.0)
Lymphs Abs: 1.2 10*3/uL (ref 0.7–4.0)
MCHC: 34.5 g/dL (ref 30.0–36.0)
MCV: 87.7 fl (ref 78.0–100.0)
Monocytes Absolute: 0.5 10*3/uL (ref 0.1–1.0)
Monocytes Relative: 11.4 % (ref 3.0–12.0)
Neutro Abs: 2.1 10*3/uL (ref 1.4–7.7)
Neutrophils Relative %: 50.9 % (ref 43.0–77.0)
Platelets: 295 10*3/uL (ref 150.0–400.0)
RBC: 4.71 Mil/uL (ref 4.22–5.81)
RDW: 13.8 % (ref 11.5–15.5)
WBC: 4.2 10*3/uL (ref 4.0–10.5)

## 2022-12-20 LAB — IBC PANEL
Iron: 126 ug/dL (ref 42–165)
Saturation Ratios: 27.3 % (ref 20.0–50.0)
TIBC: 462 ug/dL — ABNORMAL HIGH (ref 250.0–450.0)
Transferrin: 330 mg/dL (ref 212.0–360.0)

## 2022-12-20 LAB — HEMOGLOBIN A1C: Hgb A1c MFr Bld: 5.5 % (ref 4.6–6.5)

## 2022-12-20 LAB — FERRITIN: Ferritin: 50.5 ng/mL (ref 22.0–322.0)

## 2022-12-20 NOTE — Assessment & Plan Note (Signed)
Having chest pain at night when he lays down  - better with sitting up and tums Likely gerd Can taking pepcid nightly, tums prn Advised not eating after diner.

## 2022-12-20 NOTE — Assessment & Plan Note (Signed)
Chronic BP well controlled Continue amlodipine 10 mg daily, diovan-hct 320-25 mg daily cmp

## 2022-12-20 NOTE — Assessment & Plan Note (Signed)
H/o iron def anemia Check cbc, iron panel

## 2022-12-20 NOTE — Assessment & Plan Note (Signed)
Chronic Check a1c Low sugar / carb diet Stressed regular exercise   

## 2022-12-20 NOTE — Assessment & Plan Note (Signed)
Chronic Check lipid panel  Continue fenofibrate 145 mg daily Regular exercise and healthy diet encouraged

## 2022-12-20 NOTE — Assessment & Plan Note (Signed)
Chronic GERD controlled Continue pantoprazole 40 mg daily 

## 2022-12-25 ENCOUNTER — Other Ambulatory Visit: Payer: Self-pay | Admitting: Nurse Practitioner

## 2022-12-25 DIAGNOSIS — K753 Granulomatous hepatitis, not elsewhere classified: Secondary | ICD-10-CM

## 2023-01-11 ENCOUNTER — Ambulatory Visit
Admission: RE | Admit: 2023-01-11 | Discharge: 2023-01-11 | Disposition: A | Discharge status: Home / Self Care | Payer: 59 | Source: Ambulatory Visit | Attending: Nurse Practitioner | Admitting: Nurse Practitioner

## 2023-01-11 DIAGNOSIS — K753 Granulomatous hepatitis, not elsewhere classified: Secondary | ICD-10-CM

## 2023-01-11 MED ORDER — GADOPICLENOL 0.5 MMOL/ML IV SOLN
10.0000 mL | Freq: Once | INTRAVENOUS | Status: AC | PRN
  Administered 2023-01-11: 10 mL via INTRAVENOUS

## 2023-01-14 ENCOUNTER — Other Ambulatory Visit: Payer: Self-pay | Admitting: Internal Medicine

## 2023-01-15 ENCOUNTER — Other Ambulatory Visit: Payer: Self-pay

## 2023-06-21 ENCOUNTER — Ambulatory Visit: Payer: 59 | Admitting: Internal Medicine

## 2023-07-01 ENCOUNTER — Encounter: Payer: Self-pay | Admitting: Internal Medicine

## 2023-07-01 NOTE — Progress Notes (Unsigned)
Subjective:    Patient ID: Christopher [name], male    DOB: [birth year], 47 y.o.   MRN: [id number]     HPI Eh is here for follow up of his chronic medical problems.  Groin injury - [facility] - had injection, has MRI today.   Had insect bite - infection on right arm - did televisit and is on keflex.    Goes to gym 2-3 days a week.   Medications and allergies reviewed with patient and updated if appropriate.  Current Outpatient Medications on File Prior to Visit  Medication Sig Dispense Refill   amLODipine (NORVASC) 10 MG tablet TAKE 1 TABLET BY MOUTH DAILY 90 tablet 3   cephALEXin (KEFLEX) 500 MG capsule TAKE 1 CAPSULE BY MOUTH FOUR TIMES DAILY FOR 7 DAYS     fenofibrate (TRICOR) 145 MG tablet TAKE 1 TABLET BY MOUTH  DAILY (Patient taking differently: Take 145 mg by mouth daily with lunch.) 90 tablet 3   Multiple Vitamin (MULTIVITAMIN WITH MINERALS) TABS tablet Take 1 tablet by mouth daily.     pantoprazole (PROTONIX) 40 MG tablet TAKE 1 TABLET BY MOUTH  DAILY (Patient taking differently: Take 40 mg by mouth daily before lunch.) 90 tablet 3   tadalafil (CIALIS) 20 MG tablet Take 20 mg by mouth daily as needed.     valsartan-hydrochlorothiazide (DIOVAN-HCT) 320-25 MG tablet TAKE 1 TABLET BY MOUTH DAILY 90 tablet 3   No current facility-administered medications on file prior to visit.     Review of Systems  Constitutional:  Negative for fever.  Respiratory:  Negative for cough, shortness of breath and wheezing.   Cardiovascular:  Positive for leg swelling (end of day - mild). Negative for chest pain and palpitations.  Gastrointestinal:  Negative for abdominal pain.       Gerd controlled  Neurological:  Positive for headaches (occ). Negative for light-headedness.       Objective:   Vitals:   07/02/23 0829  BP: 120/84  Pulse: 64  Temp: 98.4 F (36.9 C)  SpO2: 96%   BP Readings from Last 3 Encounters:  07/02/23 120/84  12/20/22 120/74  11/14/22 136/87    Wt Readings from Last 3 Encounters:  07/02/23 230 lb (104.3 kg)  12/20/22 230 lb (104.3 kg)  11/14/22 231 lb 8 oz (105 kg)   Body mass index is 31.19 kg/m.    Physical Exam Constitutional:      General: He is not in acute distress.    Appearance: Normal appearance. He is not ill-appearing.  HENT:     Head: Normocephalic and atraumatic.  Eyes:     Conjunctiva/sclera: Conjunctivae normal.  Cardiovascular:     Rate and Rhythm: Normal rate and regular rhythm.     Heart sounds: Normal heart sounds.  Pulmonary:     Effort: Pulmonary effort is normal. No respiratory distress.     Breath sounds: Normal breath sounds. No wheezing or rales.  Musculoskeletal:     Right lower leg: No edema.     Left lower leg: No edema.  Skin:    General: Skin is warm and dry.     Findings: No rash.  Neurological:     Mental Status: He is alert. Mental status is at baseline.  Psychiatric:        Mood and Affect: Mood normal.        Lab Results  Component Value Date   WBC 4.2 12/20/2022   HGB 14.2 12/20/2022  HCT 41.2 12/20/2022   PLT 295.0 12/20/2022   GLUCOSE 100 (H) 12/20/2022   CHOL 161 12/20/2022   TRIG 146.0 12/20/2022   HDL 40.90 12/20/2022   LDLDIRECT 110.0 11/30/2016   LDLCALC 91 12/20/2022   ALT 75 (H) 12/20/2022   AST 49 (H) 12/20/2022   NA 140 12/20/2022   K 3.7 12/20/2022   CL 101 12/20/2022   CREATININE 1.20 12/20/2022   BUN 16 12/20/2022   CO2 29 12/20/2022   TSH 2.52 12/16/2021   INR 1.0 01/04/2021   HGBA1C 5.5 12/20/2022     Assessment & Plan:    See Problem List for Assessment and Plan of chronic medical problems.

## 2023-07-01 NOTE — Patient Instructions (Addendum)
      Blood work was ordered.   The lab is on the first floor.    Medications changes include :   decrease hydrochlorothiazide 12.5 mg and start losartan 25 mg      Return in about 6 months (around 01/02/2024) for Physical Exam.

## 2023-07-02 ENCOUNTER — Ambulatory Visit: Payer: 59 | Admitting: Internal Medicine

## 2023-07-02 VITALS — BP 120/84 | HR 64 | Temp 98.4°F | Ht 72.0 in | Wt 230.0 lb

## 2023-07-02 DIAGNOSIS — I10 Essential (primary) hypertension: Secondary | ICD-10-CM

## 2023-07-02 DIAGNOSIS — K760 Fatty (change of) liver, not elsewhere classified: Secondary | ICD-10-CM | POA: Diagnosis not present

## 2023-07-02 DIAGNOSIS — R739 Hyperglycemia, unspecified: Secondary | ICD-10-CM

## 2023-07-02 DIAGNOSIS — K219 Gastro-esophageal reflux disease without esophagitis: Secondary | ICD-10-CM

## 2023-07-02 DIAGNOSIS — E781 Pure hyperglyceridemia: Secondary | ICD-10-CM

## 2023-07-02 LAB — LIPID PANEL
Cholesterol: 184 mg/dL (ref 0–200)
HDL: 47.2 mg/dL (ref >39.00)
LDL Cholesterol: 113 mg/dL — ABNORMAL HIGH (ref 0–99)
NonHDL: 137.09
Total CHOL/HDL Ratio: 4
Triglycerides: 118 mg/dL (ref 0.0–149.0)
VLDL: 23.6 mg/dL (ref 0.0–40.0)

## 2023-07-02 LAB — CBC WITH DIFFERENTIAL/PLATELET
Basophils Absolute: 0.1 10*3/uL (ref 0.0–0.1)
Basophils Relative: 1 % (ref 0.0–3.0)
Eosinophils Absolute: 0.3 10*3/uL (ref 0.0–0.7)
Eosinophils Relative: 5.1 % — ABNORMAL HIGH (ref 0.0–5.0)
HCT: 44 % (ref 39.0–52.0)
Hemoglobin: 14.7 g/dL (ref 13.0–17.0)
Lymphocytes Relative: 19.4 % (ref 12.0–46.0)
Lymphs Abs: 1 10*3/uL (ref 0.7–4.0)
MCHC: 33.5 g/dL (ref 30.0–36.0)
MCV: 89.4 fl (ref 78.0–100.0)
Monocytes Absolute: 0.5 10*3/uL (ref 0.1–1.0)
Monocytes Relative: 9.7 % (ref 3.0–12.0)
Neutro Abs: 3.4 10*3/uL (ref 1.4–7.7)
Neutrophils Relative %: 64.8 % (ref 43.0–77.0)
Platelets: 301 10*3/uL (ref 150.0–400.0)
RBC: 4.92 Mil/uL (ref 4.22–5.81)
RDW: 13.1 % (ref 11.5–15.5)
WBC: 5.3 10*3/uL (ref 4.0–10.5)

## 2023-07-02 LAB — COMPREHENSIVE METABOLIC PANEL
ALT: 50 U/L (ref 0–53)
AST: 35 U/L (ref 0–37)
Albumin: 4.4 g/dL (ref 3.5–5.2)
Alkaline Phosphatase: 44 U/L (ref 39–117)
BUN: 19 mg/dL (ref 6–23)
CO2: 28 mEq/L (ref 19–32)
Calcium: 9.4 mg/dL (ref 8.4–10.5)
Chloride: 102 mEq/L (ref 96–112)
Creatinine, Ser: 1.22 mg/dL (ref 0.40–1.50)
GFR: 70.46 mL/min (ref >60.00)
Glucose, Bld: 103 mg/dL — ABNORMAL HIGH (ref 70–99)
Potassium: 3.8 mEq/L (ref 3.5–5.1)
Sodium: 141 mEq/L (ref 135–145)
Total Bilirubin: 0.7 mg/dL (ref 0.2–1.2)
Total Protein: 7 g/dL (ref 6.0–8.3)

## 2023-07-02 LAB — HEMOGLOBIN A1C: Hgb A1c MFr Bld: 5.6 % (ref 4.6–6.5)

## 2023-07-02 NOTE — Assessment & Plan Note (Signed)
Chronic Check a1c Low sugar / carb diet Stressed regular exercise  

## 2023-07-02 NOTE — Assessment & Plan Note (Addendum)
Chronic CMP, CBC Encouraged regular exercise, healthy diet and weight loss

## 2023-07-02 NOTE — Assessment & Plan Note (Signed)
Chronic BP well controlled CMP, CBC Continue amlodipine 10 mg daily, diovan-hct 320-25 mg daily

## 2023-07-02 NOTE — Assessment & Plan Note (Signed)
Chronic Check lipid panel, CMP Continue fenofibrate 145 mg daily Regular exercise and healthy diet encouraged Weight loss encouraged

## 2023-07-02 NOTE — Assessment & Plan Note (Signed)
Chronic GERD controlled Continue pantoprazole 40 mg daily 

## 2023-08-16 ENCOUNTER — Ambulatory Visit: Payer: Self-pay | Admitting: General Surgery

## 2023-10-17 ENCOUNTER — Other Ambulatory Visit: Payer: Self-pay | Admitting: General Surgery

## 2023-10-17 DIAGNOSIS — S3981XA Other specified injuries of abdomen, initial encounter: Secondary | ICD-10-CM

## 2023-10-17 NOTE — Progress Notes (Signed)
Spoke with Dawn, OR scheduler with Dr. Marga Hoots office regarding consent order. Requested laterality be specified for hernia repair portion of surgery. Dawn said she does not handle consent changes, but will notify Dr. Marga Hoots RNs to make change.

## 2023-10-17 NOTE — Pre-Procedure Instructions (Signed)
Surgical Instructions   Your procedure is scheduled on October 23, 2023. Report to [facility] Main Entrance "A" at 11:00 A.M., then check in with the Admitting office. Any questions or running late day of surgery: call [phone]  Questions prior to your surgery date: call [phone], Monday-Friday, 8am-4pm. If you experience any cold or flu symptoms such as cough, fever, chills, shortness of breath, etc. between now and your scheduled surgery, please notify us at the above number.     Remember:  Do not eat or drink after midnight the night before your surgery    Take these medicines the morning of surgery with A SIP OF WATER: amLODipine (NORVASC)  fenofibrate (TRICOR)  pantoprazole (PROTONIX)    One week prior to surgery, STOP taking any Aspirin (unless otherwise instructed by your surgeon) Aleve, Naproxen, Ibuprofen, Motrin, Advil, Goody's, BC's, all herbal medications, fish oil, and non-prescription vitamins.                     Do NOT Smoke (Tobacco/Vaping) for 24 hours prior to your procedure.  If you use a CPAP at night, you may bring your mask/headgear for your overnight stay.   You will be asked to remove any contacts, glasses, piercing's, hearing aid's, dentures/partials prior to surgery. Please bring cases for these items if needed.    Patients discharged the day of surgery will not be allowed to drive home, and someone needs to stay with them for 24 hours.  SURGICAL WAITING ROOM VISITATION Patients may have no more than 2 support people in the waiting area - these visitors may rotate.   Pre-op nurse will coordinate an appropriate time for 1 ADULT support person, who may not rotate, to accompany patient in pre-op.  Children under the age of 9 must have an adult with them who is not the patient and must remain in the main waiting area with an adult.  If the patient needs to stay at the hospital during part of their recovery, the visitor guidelines for inpatient  rooms apply.  Please refer to the [facility] website for the visitor guidelines for any additional information.   If you received a COVID test during your pre-op visit  it is requested that you wear a mask when out in public, stay away from anyone that may not be feeling well and notify your surgeon if you develop symptoms. If you have been in contact with anyone that has tested positive in the last 10 days please notify you surgeon.      Pre-operative CHG Bathing Instructions   You can play a key role in reducing the risk of infection after surgery. Your skin needs to be as free of germs as possible. You can reduce the number of germs on your skin by washing with CHG (chlorhexidine gluconate) soap before surgery. CHG is an antiseptic soap that kills germs and continues to kill germs even after washing.   DO NOT use if you have an allergy to chlorhexidine/CHG or antibacterial soaps. If your skin becomes reddened or irritated, stop using the CHG and notify one of our RNs at [phone].              TAKE A SHOWER THE NIGHT BEFORE SURGERY AND THE DAY OF SURGERY    Please keep in mind the following:  DO NOT shave, including legs and underarms, 48 hours prior to surgery.   You may shave your face before/day of surgery.  Place clean sheets on your  bed the night before surgery Use a clean washcloth (not used since being washed) for each shower. DO NOT sleep with pet's night before surgery.  CHG Shower Instructions:  Wash your face and private area with normal soap. If you choose to wash your hair, wash first with your normal shampoo.  After you use shampoo/soap, rinse your hair and body thoroughly to remove shampoo/soap residue.  Turn the water OFF and apply half the bottle of CHG soap to a CLEAN washcloth.  Apply CHG soap ONLY FROM YOUR NECK DOWN TO YOUR TOES (washing for 3-5 minutes)  DO NOT use CHG soap on face, private areas, open wounds, or sores.  Pay special attention to the area  where your surgery is being performed.  If you are having back surgery, having someone wash your back for you may be helpful. Wait 2 minutes after CHG soap is applied, then you may rinse off the CHG soap.  Pat dry with a clean towel  Put on clean pajamas    Additional instructions for the day of surgery: DO NOT APPLY any lotions, deodorants, cologne, or perfumes.   Do not wear jewelry or makeup Do not wear nail polish, gel polish, artificial nails, or any other type of covering on natural nails (fingers and toes) Do not bring valuables to the hospital. [facility] is not responsible for valuables/personal belongings. Put on clean/comfortable clothes.  Please brush your teeth.  Ask your nurse before applying any prescription medications to the skin.

## 2023-10-18 ENCOUNTER — Other Ambulatory Visit: Payer: Self-pay

## 2023-10-18 ENCOUNTER — Encounter (HOSPITAL_COMMUNITY)
Admission: RE | Admit: 2023-10-18 | Discharge: 2023-10-18 | Disposition: A | Discharge status: Home / Self Care | Payer: 59 | Source: Ambulatory Visit | Attending: General Surgery | Admitting: General Surgery

## 2023-10-18 ENCOUNTER — Encounter (HOSPITAL_COMMUNITY): Payer: Self-pay

## 2023-10-18 VITALS — BP 137/80 | HR 59 | Temp 98.1°F | Resp 18 | Ht 72.0 in | Wt 241.3 lb

## 2023-10-18 DIAGNOSIS — K769 Liver disease, unspecified: Secondary | ICD-10-CM | POA: Diagnosis not present

## 2023-10-18 DIAGNOSIS — Z01818 Encounter for other preprocedural examination: Secondary | ICD-10-CM | POA: Insufficient documentation

## 2023-10-18 DIAGNOSIS — I2510 Atherosclerotic heart disease of native coronary artery without angina pectoris: Secondary | ICD-10-CM | POA: Insufficient documentation

## 2023-10-18 DIAGNOSIS — Z01810 Encounter for preprocedural cardiovascular examination: Secondary | ICD-10-CM | POA: Diagnosis present

## 2023-10-18 DIAGNOSIS — Z01812 Encounter for preprocedural laboratory examination: Secondary | ICD-10-CM | POA: Diagnosis present

## 2023-10-18 HISTORY — DX: Fatty (change of) liver, not elsewhere classified: K76.0

## 2023-10-18 LAB — COMPREHENSIVE METABOLIC PANEL
ALT: 46 U/L — ABNORMAL HIGH (ref 0–44)
AST: 38 U/L (ref 15–41)
Albumin: 3.7 g/dL (ref 3.5–5.0)
Alkaline Phosphatase: 35 U/L — ABNORMAL LOW (ref 38–126)
Anion gap: 10 (ref 5–15)
BUN: 14 mg/dL (ref 6–20)
CO2: 25 mmol/L (ref 22–32)
Calcium: 9 mg/dL (ref 8.9–10.3)
Chloride: 104 mmol/L (ref 98–111)
Creatinine, Ser: 1.29 mg/dL — ABNORMAL HIGH (ref 0.61–1.24)
GFR, Estimated: >60 mL/min (ref >60)
Glucose, Bld: 154 mg/dL — ABNORMAL HIGH (ref 70–99)
Potassium: 3.4 mmol/L — ABNORMAL LOW (ref 3.5–5.1)
Sodium: 139 mmol/L (ref 135–145)
Total Bilirubin: 0.8 mg/dL (ref <1.2)
Total Protein: 6.5 g/dL (ref 6.5–8.1)

## 2023-10-18 LAB — CBC
HCT: 42.5 % (ref 39.0–52.0)
Hemoglobin: 14.3 g/dL (ref 13.0–17.0)
MCH: 29.7 pg (ref 26.0–34.0)
MCHC: 33.6 g/dL (ref 30.0–36.0)
MCV: 88.4 fL (ref 80.0–100.0)
Platelets: 281 10*3/uL (ref 150–400)
RBC: 4.81 MIL/uL (ref 4.22–5.81)
RDW: 12.6 % (ref 11.5–15.5)
WBC: 4.3 10*3/uL (ref 4.0–10.5)
nRBC: 0 % (ref 0.0–0.2)

## 2023-10-18 NOTE — Progress Notes (Addendum)
PCP - Dr. Cheryll Cockayne Cardiologist - Pt saw Dr. [name] in 2021 for CP. CT coronary ordered and result normal. Follow-up appointment cancelled by provider per chart.  PPM/ICD - Denies Device Orders - n/a Rep Notified - n/a  Chest x-ray - n/a EKG - 10/18/2023 Stress Test - Denies ECHO - 07/10/2022 Cardiac Cath - Denies Coronary CT - 08/09/2020  Sleep Study - Denies CPAP - n/a  No DM  Last dose of GLP1 agonist- n/a GLP1 instructions: n/a  Blood Thinner Instructions: n/a Aspirin Instructions: n/a  NPO after midnight  COVID TEST- n/a   Anesthesia review: Yes. Abnormal EKG review. Recent cardiac testing within the last three years.  Patient denies shortness of breath, fever, cough and chest pain at PAT appointment. Pt denies any respiratory illness/infection in the last two months.    All instructions explained to the patient, with a verbal understanding of the material. Patient agrees to go over the instructions while at home for a better understanding. Patient also instructed to self quarantine after being tested for COVID-19. The opportunity to ask questions was provided.

## 2023-10-19 NOTE — Progress Notes (Signed)
Anesthesia Chart Review:  Case: [id number] Date/Time: 10/23/23 1245   Procedure: XI ROBOTIC ASSISTED LEFT INGUINAL HERNIA REPAIR WITH MESH LEFT ADDUCTOR RELEASE (Left) - GEN / TAP BLOCK   Anesthesia type: General   Pre-op diagnosis: CHRONIC LEFT GROIN PAIN SPORTS HERNIA   Location: [facility] ROOM 10 / [facility]   Surgeons: [name], MD       DISCUSSION: Patient is a 47 year old male scheduled for the above procedure.  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 Alcohol intake is documented as 8-10 standard drinks per week. Normal coronaries by [facility] 08/09/20.  Anesthesia team to evaluate on the day of surgery.   VS: BP 137/80   Pulse (!) 59   Temp 36.7 C   Resp 18   Ht 6' (1.829 m)   Wt 109.5 kg   SpO2 97%   BMI 32.73 kg/m   PROVIDERS: Pincus Sanes, MD is PCP  - Si Gaul, MD is HEM. Last evaluated 11/14/22 for IDA related to GI blood loss from internal hemorrhoids. S/p IV iron x 3 (total). As needed follow-up recommended.  - He was evaluated by cardiologist [name], MD in August 2021 for chest pain.  He had a normal coronary CTA on 08/09/20 showing CAC of 0, normal coronaries.     LABS: Preoperative labs noted and results appears consistent with labs from January and August of this year. A1c 5.6% on 07/02/23.  (all labs ordered are listed, but only abnormal results are displayed)  Labs Reviewed  COMPREHENSIVE METABOLIC PANEL - Abnormal; Notable for the following components:      Result Value   Potassium 3.4 (*)    Glucose, Bld 154 (*)    Creatinine, Ser 1.29 (*)    ALT 46 (*)    Alkaline Phosphatase 35 (*)    All other components within normal limits  CBC     IMAGES: MRI Abd 01/11/23: IMPRESSION: 1. Diffuse macronodular fatty liver infiltration with areas of interstitial fibrosis. No aggressive liver lesion. 2. Mild splenomegaly but no varices, ascites.  Portal vein is patent. 3. Possible small 4. gallbladder polyps measuring up to 3 mm. These were present but not as well seen with level of motion on that examination. No ductal dilatation 5. Prior CT scan demonstrated some peripancreatic fat stranding. Please correlate with history of pancreatitis. This is not seen on the current MRI.   CT Chest 07/14/22 (for follow-up nodules): IMPRESSION: 1. The previous pulmonary nodules have all decreased in size or resolved. This is consistent with benign process. No further nodule follow-up is needed. This recommendation follows the Fleischner society guidelines. 2. No acute intrathoracic abnormality. 3. Hepatic steatosis.    EKG: 10/18/23: NSR, poor r wave progression. Non-specific T wave abnormality. Appears overall stable when compared to tracing from 09/04/22.   CV: Echo 07/10/22: IMPRESSIONS   1. Left ventricular ejection fraction by 3D volume is 55 %. The left  ventricle has normal function. The left ventricle has no regional wall  motion abnormalities. There is mild left ventricular hypertrophy. Left  ventricular diastolic parameters are  consistent with Grade II diastolic dysfunction (pseudonormalization). The  average left ventricular global longitudinal strain is -20.1 %. The global  longitudinal strain is normal.   2. Right ventricular systolic function is normal. The right ventricular  size is mildly enlarged.   3. The mitral valve is normal in structure. No evidence of mitral valve  regurgitation.  No evidence of mitral stenosis.   4. The aortic valve is normal in structure. Aortic valve regurgitation is  not visualized. No aortic stenosis is present.   5. The inferior vena cava is normal in size with greater than 50%  respiratory variability, suggesting right atrial pressure of 3 mmHg.    CT Coronary 08/09/20: IMPRESSION: 1. Coronary calcium score of 0. 2. Normal coronary origin with left dominance. 3. Normal coronary  arteries. RECOMMENDATIONS: 1. No evidence of CAD (0%). Consider non-atherosclerotic causes of chest pain.   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   Pain in lower limb 03/02/2014   Pancreatitis 09/04/2022    Past Surgical History:  Procedure Laterality Date   CARPAL TUNNEL RELEASE Right 2008   COLONOSCOPY     IBS   CYST EXCISION Right 2015   "2nd toe"   ESOPHAGOGASTRODUODENOSCOPY     KNEE ARTHROSCOPY Right 1992   LASIK     REFRACTIVE SURGERY Bilateral 2013   SHOULDER SURGERY Left 10/2016   UPPER GI ENDOSCOPY     chest pain   WRIST FRACTURE SURGERY Right 2008   "got plate and screws"    MEDICATIONS:  amLODipine (NORVASC) 10 MG tablet   fenofibrate (TRICOR) 145 MG tablet   Multiple Vitamin (MULTIVITAMIN WITH MINERALS) TABS tablet   pantoprazole (PROTONIX) 40 MG tablet   tadalafil (CIALIS) 20 MG tablet   valsartan-hydrochlorothiazide (DIOVAN-HCT) 320-25 MG tablet   No current facility-administered medications for this encounter.    Shonna Chock, PA-C Surgical Short Stay/Anesthesiology [facility] Phone [phone] [facility] Phone [phone] 10/19/2023 2:32 PM

## 2023-10-19 NOTE — Anesthesia Preprocedure Evaluation (Signed)
Anesthesia Evaluation  Patient identified by MRN, date of birth, ID band Patient awake    Reviewed: Allergy & Precautions, NPO status , Patient's Chart, lab work & pertinent test results, reviewed documented beta blocker date and time   History of Anesthesia Complications Negative for: history of anesthetic complications  Airway Mallampati: III  TM Distance: >3 FB Neck ROM: Full    Dental  (+) Teeth Intact, Dental Advisory Given   Pulmonary    Pulmonary exam normal breath sounds clear to auscultation       Cardiovascular hypertension, (-) angina (-) CAD and (-) Past MI Normal cardiovascular exam Rhythm:Regular Rate:Normal     Neuro/Psych  Headaches, neg Seizures    GI/Hepatic hiatal hernia,GERD  ,,(+)     substance abuse  alcohol use, Hepatitis -  Endo/Other    Renal/GU      Musculoskeletal   Abdominal   Peds  Hematology  (+) Blood dyscrasia, anemia   Anesthesia Other Findings   Reproductive/Obstetrics                             Anesthesia Physical Anesthesia Plan  ASA: 2  Anesthesia Plan: General   Post-op Pain Management:    Induction: Intravenous  PONV Risk Score and Plan: 2 and Ondansetron and Dexamethasone  Airway Management Planned: Oral ETT  Additional Equipment:   Intra-op Plan:   Post-operative Plan: Extubation in OR  Informed Consent: I have reviewed the patients History and Physical, chart, labs and discussed the procedure including the risks, benefits and alternatives for the proposed anesthesia with the patient or authorized representative who has indicated his/her understanding and acceptance.     Dental advisory given  Plan Discussed with: CRNA  Anesthesia Plan Comments: (PAT note written 10/19/2023 by Shonna Chock, PA-C.  )       Anesthesia Quick Evaluation

## 2023-10-23 ENCOUNTER — Ambulatory Visit (HOSPITAL_COMMUNITY): Payer: 59 | Admitting: Vascular Surgery

## 2023-10-23 ENCOUNTER — Other Ambulatory Visit: Payer: Self-pay

## 2023-10-23 ENCOUNTER — Ambulatory Visit (HOSPITAL_COMMUNITY)
Admission: RE | Admit: 2023-10-23 | Discharge: 2023-10-23 | Disposition: A | Discharge status: Home / Self Care | Payer: 59 | Attending: General Surgery | Admitting: General Surgery

## 2023-10-23 ENCOUNTER — Ambulatory Visit (HOSPITAL_COMMUNITY): Payer: 59 | Admitting: Anesthesiology

## 2023-10-23 ENCOUNTER — Encounter (HOSPITAL_COMMUNITY)
Admission: RE | Disposition: A | Discharge status: Home / Self Care | Payer: Self-pay | Source: Home / Self Care | Attending: General Surgery

## 2023-10-23 ENCOUNTER — Encounter (HOSPITAL_COMMUNITY): Payer: Self-pay | Admitting: General Surgery

## 2023-10-23 DIAGNOSIS — K4090 Unilateral inguinal hernia, without obstruction or gangrene, not specified as recurrent: Secondary | ICD-10-CM | POA: Diagnosis present

## 2023-10-23 DIAGNOSIS — K449 Diaphragmatic hernia without obstruction or gangrene: Secondary | ICD-10-CM | POA: Diagnosis not present

## 2023-10-23 DIAGNOSIS — I10 Essential (primary) hypertension: Secondary | ICD-10-CM | POA: Insufficient documentation

## 2023-10-23 DIAGNOSIS — K219 Gastro-esophageal reflux disease without esophagitis: Secondary | ICD-10-CM | POA: Insufficient documentation

## 2023-10-23 HISTORY — PX: INSERTION OF MESH: SHX5868

## 2023-10-23 SURGERY — HERNIORRHAPHY, INGUINAL, ROBOT-ASSISTED, LAPAROSCOPIC
Anesthesia: General | Site: Inguinal | Laterality: Left

## 2023-10-23 MED ORDER — IBUPROFEN 200 MG PO TABS: 600.0000 mg | ORAL_TABLET | Freq: Four times a day (QID) | ORAL | 0 refills | Status: AC

## 2023-10-23 MED ORDER — ROCURONIUM BROMIDE 10 MG/ML (PF) SYRINGE
PREFILLED_SYRINGE | INTRAVENOUS | Status: DC | PRN
  Administered 2023-10-23 (×3): 20 mg via INTRAVENOUS
  Administered 2023-10-23: 60 mg via INTRAVENOUS

## 2023-10-23 MED ORDER — SUGAMMADEX SODIUM 200 MG/2ML IV SOLN
INTRAVENOUS | Status: DC | PRN
  Administered 2023-10-23: 300 mg via INTRAVENOUS

## 2023-10-23 MED ORDER — SODIUM CHLORIDE 0.9 % IV SOLN: 250.0000 mL | INTRAVENOUS | Status: DC | PRN

## 2023-10-23 MED ORDER — MIDAZOLAM HCL 2 MG/2ML IJ SOLN: 2.0000 mg | Freq: Once | INTRAMUSCULAR | Status: AC

## 2023-10-23 MED ORDER — LACTATED RINGERS IV SOLN: INTRAVENOUS | Status: DC | PRN

## 2023-10-23 MED ORDER — ROCURONIUM BROMIDE 10 MG/ML (PF) SYRINGE
PREFILLED_SYRINGE | INTRAVENOUS | Status: AC
  Filled 2023-10-23: qty 10

## 2023-10-23 MED ORDER — PROPOFOL 10 MG/ML IV BOLUS
INTRAVENOUS | Status: AC
  Filled 2023-10-23: qty 20

## 2023-10-23 MED ORDER — MEPERIDINE HCL 25 MG/ML IJ SOLN: 6.2500 mg | INTRAMUSCULAR | Status: DC | PRN

## 2023-10-23 MED ORDER — ACETAMINOPHEN 650 MG RE SUPP: 650.0000 mg | RECTAL | Status: DC | PRN

## 2023-10-23 MED ORDER — ONDANSETRON HCL 4 MG/2ML IJ SOLN
INTRAMUSCULAR | Status: DC | PRN
  Administered 2023-10-23: 4 mg via INTRAVENOUS

## 2023-10-23 MED ORDER — CHLORHEXIDINE GLUCONATE CLOTH 2 % EX PADS: 6.0000 | MEDICATED_PAD | Freq: Once | CUTANEOUS | Status: DC

## 2023-10-23 MED ORDER — BUPIVACAINE-EPINEPHRINE 0.25% -1:200000 IJ SOLN
INTRAMUSCULAR | Status: DC | PRN
  Administered 2023-10-23: 30 mL

## 2023-10-23 MED ORDER — MIDAZOLAM HCL 2 MG/2ML IJ SOLN
INTRAMUSCULAR | Status: DC | PRN
  Administered 2023-10-23: 2 mg via INTRAVENOUS

## 2023-10-23 MED ORDER — FENTANYL CITRATE (PF) 250 MCG/5ML IJ SOLN
INTRAMUSCULAR | Status: DC | PRN
  Administered 2023-10-23: 50 ug via INTRAVENOUS
  Administered 2023-10-23: 100 ug via INTRAVENOUS
  Administered 2023-10-23 (×2): 50 ug via INTRAVENOUS

## 2023-10-23 MED ORDER — FENTANYL CITRATE (PF) 100 MCG/2ML IJ SOLN: 50.0000 ug | Freq: Once | INTRAMUSCULAR | Status: AC

## 2023-10-23 MED ORDER — OXYCODONE HCL 5 MG PO TABS: 5.0000 mg | ORAL_TABLET | Freq: Once | ORAL | Status: DC | PRN

## 2023-10-23 MED ORDER — KETOROLAC TROMETHAMINE 30 MG/ML IJ SOLN
INTRAMUSCULAR | Status: AC
  Filled 2023-10-23: qty 1

## 2023-10-23 MED ORDER — HYDROMORPHONE HCL 1 MG/ML IJ SOLN
INTRAMUSCULAR | Status: AC
  Filled 2023-10-23: qty 0.5

## 2023-10-23 MED ORDER — ACETAMINOPHEN 325 MG PO TABS: 650.0000 mg | ORAL_TABLET | ORAL | Status: DC | PRN

## 2023-10-23 MED ORDER — MIDAZOLAM HCL 2 MG/2ML IJ SOLN
INTRAMUSCULAR | Status: AC
Start: 2023-10-23 — End: ?
  Filled 2023-10-23: qty 2

## 2023-10-23 MED ORDER — PROPOFOL 10 MG/ML IV BOLUS
INTRAVENOUS | Status: DC | PRN
  Administered 2023-10-23: 200 mg via INTRAVENOUS

## 2023-10-23 MED ORDER — HYDROMORPHONE HCL 1 MG/ML IJ SOLN
INTRAMUSCULAR | Status: AC
  Filled 2023-10-23: qty 1

## 2023-10-23 MED ORDER — ONDANSETRON HCL 4 MG/2ML IJ SOLN
INTRAMUSCULAR | Status: AC
  Filled 2023-10-23: qty 2

## 2023-10-23 MED ORDER — BUPIVACAINE HCL (PF) 0.25 % IJ SOLN
INTRAMUSCULAR | Status: DC | PRN
  Administered 2023-10-23: 30 mL via EPIDURAL

## 2023-10-23 MED ORDER — 0.9 % SODIUM CHLORIDE (POUR BTL) OPTIME
TOPICAL | Status: DC | PRN
  Administered 2023-10-23: 1000 mL

## 2023-10-23 MED ORDER — MIDAZOLAM HCL 2 MG/2ML IJ SOLN
INTRAMUSCULAR | Status: AC
  Administered 2023-10-23: 2 mg via INTRAVENOUS
  Filled 2023-10-23: qty 2

## 2023-10-23 MED ORDER — AMISULPRIDE (ANTIEMETIC) 5 MG/2ML IV SOLN: 10.0000 mg | Freq: Once | INTRAVENOUS | Status: DC | PRN

## 2023-10-23 MED ORDER — ONDANSETRON HCL 4 MG/2ML IJ SOLN: 4.0000 mg | Freq: Once | INTRAMUSCULAR | Status: DC | PRN

## 2023-10-23 MED ORDER — PHENYLEPHRINE 80 MCG/ML (10ML) SYRINGE FOR IV PUSH (FOR BLOOD PRESSURE SUPPORT)
PREFILLED_SYRINGE | INTRAVENOUS | Status: AC
  Filled 2023-10-23: qty 10

## 2023-10-23 MED ORDER — CHLORHEXIDINE GLUCONATE 0.12 % MT SOLN
15.0000 mL | Freq: Once | OROMUCOSAL | Status: AC
  Administered 2023-10-23: 15 mL via OROMUCOSAL
  Filled 2023-10-23: qty 15

## 2023-10-23 MED ORDER — CEFAZOLIN SODIUM-DEXTROSE 2-4 GM/100ML-% IV SOLN
2.0000 g | INTRAVENOUS | Status: AC
  Administered 2023-10-23: 2 g via INTRAVENOUS
  Filled 2023-10-23: qty 100

## 2023-10-23 MED ORDER — DEXMEDETOMIDINE HCL IN NACL 80 MCG/20ML IV SOLN
INTRAVENOUS | Status: DC | PRN
  Administered 2023-10-23: 12 ug via INTRAVENOUS
  Administered 2023-10-23: 8 ug via INTRAVENOUS
  Administered 2023-10-23: 20 ug via INTRAVENOUS

## 2023-10-23 MED ORDER — SODIUM CHLORIDE 0.9% FLUSH: 3.0000 mL | Freq: Two times a day (BID) | INTRAVENOUS | Status: DC

## 2023-10-23 MED ORDER — ACETAMINOPHEN 325 MG PO TABS: 650.0000 mg | ORAL_TABLET | Freq: Four times a day (QID) | ORAL | 0 refills | Status: AC

## 2023-10-23 MED ORDER — LACTATED RINGERS IV SOLN: INTRAVENOUS | Status: DC

## 2023-10-23 MED ORDER — FENTANYL CITRATE (PF) 250 MCG/5ML IJ SOLN
INTRAMUSCULAR | Status: AC
  Filled 2023-10-23: qty 5

## 2023-10-23 MED ORDER — HYDROMORPHONE HCL 1 MG/ML IJ SOLN
0.2500 mg | INTRAMUSCULAR | Status: DC | PRN
  Administered 2023-10-23 (×2): 0.5 mg via INTRAVENOUS

## 2023-10-23 MED ORDER — ORAL CARE MOUTH RINSE: 15.0000 mL | Freq: Once | OROMUCOSAL | Status: AC

## 2023-10-23 MED ORDER — KETOROLAC TROMETHAMINE 30 MG/ML IJ SOLN
30.0000 mg | Freq: Once | INTRAMUSCULAR | Status: AC | PRN
  Administered 2023-10-23: 30 mg via INTRAVENOUS

## 2023-10-23 MED ORDER — OXYCODONE HCL 5 MG/5ML PO SOLN: 5.0000 mg | Freq: Once | ORAL | Status: DC | PRN

## 2023-10-23 MED ORDER — SODIUM CHLORIDE 0.9% FLUSH: 3.0000 mL | INTRAVENOUS | Status: DC | PRN

## 2023-10-23 MED ORDER — FENTANYL CITRATE (PF) 100 MCG/2ML IJ SOLN
INTRAMUSCULAR | Status: AC
  Administered 2023-10-23: 50 ug via INTRAVENOUS
  Filled 2023-10-23: qty 2

## 2023-10-23 MED ORDER — BUPIVACAINE-EPINEPHRINE (PF) 0.25% -1:200000 IJ SOLN
INTRAMUSCULAR | Status: AC
  Filled 2023-10-23: qty 30

## 2023-10-23 MED ORDER — DEXAMETHASONE SODIUM PHOSPHATE 10 MG/ML IJ SOLN
INTRAMUSCULAR | Status: DC | PRN
  Administered 2023-10-23: 10 mg via INTRAVENOUS

## 2023-10-23 MED ORDER — OXYCODONE HCL 5 MG PO TABS: 5.0000 mg | ORAL_TABLET | ORAL | Status: DC | PRN

## 2023-10-23 MED ORDER — OXYCODONE HCL 5 MG PO TABS: 5.0000 mg | ORAL_TABLET | Freq: Three times a day (TID) | ORAL | 0 refills | Status: AC | PRN

## 2023-10-23 SURGICAL SUPPLY — 52 items
BAG COUNTER SPONGE SURGICOUNT (BAG) IMPLANT
CHLORAPREP W/TINT 26 (MISCELLANEOUS) ×3 IMPLANT
COVER MAYO STAND STRL (DRAPES) ×3 IMPLANT
COVER SURGICAL LIGHT HANDLE (MISCELLANEOUS) ×3 IMPLANT
COVER TIP SHEARS 8 DVNC (MISCELLANEOUS) ×3 IMPLANT
DEFOGGER SCOPE WARMER CLEARIFY (MISCELLANEOUS) IMPLANT
DERMABOND ADVANCED .7 DNX12 (GAUZE/BANDAGES/DRESSINGS) ×3 IMPLANT
DEVICE TROCAR PUNCTURE CLOSURE (ENDOMECHANICALS) IMPLANT
DRAPE ARM DVNC X/XI (DISPOSABLE) ×12 IMPLANT
DRAPE COLUMN DVNC XI (DISPOSABLE) ×3 IMPLANT
DRAPE CV SPLIT W-CLR ANES SCRN (DRAPES) ×3 IMPLANT
DRAPE LAPAROTOMY 100X72 PEDS (DRAPES) IMPLANT
DRAPE SURG ORHT 6 SPLT 77X108 (DRAPES) ×3 IMPLANT
DRIVER NDL MEGA 8 DVNC XI (INSTRUMENTS) ×3 IMPLANT
DRIVER NDLE MEGA DVNC XI (INSTRUMENTS) ×2
ELECT BLADE 4.0 EZ CLEAN MEGAD (MISCELLANEOUS) ×2
ELECT REM PT RETURN 9FT ADLT (ELECTROSURGICAL) ×2
ELECTRODE BLDE 4.0 EZ CLN MEGD (MISCELLANEOUS) IMPLANT
ELECTRODE REM PT RTRN 9FT ADLT (ELECTROSURGICAL) ×3 IMPLANT
FORCEPS BPLR 8 MD DVNC XI (FORCEP) ×3 IMPLANT
FORCEPS BPLR FENES DVNC XI (FORCEP) IMPLANT
GLOVE BIO SURGEON STRL SZ7 (GLOVE) ×3 IMPLANT
GOWN STRL REUS W/ TWL LRG LVL3 (GOWN DISPOSABLE) ×6 IMPLANT
GOWN STRL REUS W/ TWL XL LVL3 (GOWN DISPOSABLE) ×6 IMPLANT
GOWN STRL REUS W/TWL 2XL LVL3 (GOWN DISPOSABLE) ×3 IMPLANT
KIT BASIN OR (CUSTOM PROCEDURE TRAY) ×3 IMPLANT
KIT TURNOVER KIT B (KITS) ×3 IMPLANT
MARKER SKIN DUAL TIP RULER LAB (MISCELLANEOUS) ×3 IMPLANT
MESH 3DMAX MID 4X6 LT LRG (Mesh General) IMPLANT
NDL HYPO 22X1.5 SAFETY MO (MISCELLANEOUS) ×3 IMPLANT
NDL INSUFFLATION 14GA 120MM (NEEDLE) ×3 IMPLANT
NEEDLE HYPO 22X1.5 SAFETY MO (MISCELLANEOUS) ×2
NEEDLE INSUFFLATION 14GA 120MM (NEEDLE) ×2
OBTURATOR OPTICAL STND 8 DVNC (TROCAR) ×2
OBTURATOR OPTICALSTD 8 DVNC (TROCAR) ×3 IMPLANT
PAD ARMBOARD 7.5X6 YLW CONV (MISCELLANEOUS) ×6 IMPLANT
SCISSORS MNPLR CVD DVNC XI (INSTRUMENTS) ×3 IMPLANT
SEAL UNIV 5-12 XI (MISCELLANEOUS) ×9 IMPLANT
SET TUBE SMOKE EVAC HIGH FLOW (TUBING) ×3 IMPLANT
SPIKE FLUID TRANSFER (MISCELLANEOUS) ×3 IMPLANT
STOPCOCK 4 WAY LG BORE MALE ST (IV SETS) ×3 IMPLANT
SUT MNCRL AB 4-0 PS2 18 (SUTURE) ×3 IMPLANT
SUT STRATA PDS 0 15 CT-1.5 (SUTURE) IMPLANT
SUT VIC AB 3-0 SH 18 (SUTURE) IMPLANT
SUT VIC AB 3-0 SH 27X BRD (SUTURE) IMPLANT
SUT VIC AB 3-0 SH 27XBRD (SUTURE) ×3 IMPLANT
SUT VLOC BARB 180 ABS3/0GR12 (SUTURE)
SUTURE VLOC BRB 180 ABS3/0GR12 (SUTURE) IMPLANT
TOWEL GREEN STERILE FF (TOWEL DISPOSABLE) ×3 IMPLANT
TRAY LAPAROSCOPIC MC (CUSTOM PROCEDURE TRAY) ×3 IMPLANT
TUBE SUCT ARGYLE STRL (TUBING) IMPLANT
YANKAUER SUCT BULB TIP NO VENT (SUCTIONS) IMPLANT

## 2023-10-23 NOTE — H&P (Signed)
Christopher [name] January 13, 1976  [id number].    HPI:  47 y/o M w/ a hx of chronic groin pain that based on imaging, history, and exam is consistent with a sport's hernia with adductor longus partial avulsion.  He presents today for surgical management and reports that he is in his usual state of health with no recent changes in medication.  ROS: Review of Systems  Constitutional: Negative.   HENT: Negative.    Eyes: Negative.   Respiratory: Negative.    Cardiovascular: Negative.   Gastrointestinal: Negative.   Genitourinary: Negative.   Musculoskeletal:  Positive for joint pain.  Skin: Negative.   Neurological: Negative.   Endo/Heme/Allergies: Negative.   Psychiatric/Behavioral: Negative.      Family History  Problem Relation Age of Onset   Hypertension Mother    Hyperlipidemia Mother    Depression Mother    Hypertension Father    Depression Sister    Heart disease Maternal Uncle    Alzheimer's disease Maternal Grandmother    Alcohol abuse Maternal Grandfather    Heart disease Maternal Grandfather    Heart failure Maternal Grandfather    Alzheimer's disease Paternal Grandmother    Alcohol abuse Paternal Grandfather    Throat cancer Paternal Grandfather    Colon cancer Neg Hx    Rectal cancer Neg Hx     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 He has never used smokeless tobacco. He reports current alcohol use of about 8.0 - 10.0 standard drinks of alcohol per week. He reports that he does not use drugs.  Allergies:  Allergies  Allergen Reactions   Morphine And Codeine Nausea And Vomiting and Other (See Comments)    Sweats, also    Medications Prior to Admission  Medication Sig Dispense Refill   amLODipine (NORVASC) 10 MG tablet TAKE 1 TABLET BY MOUTH DAILY 90 tablet 3   fenofibrate (TRICOR) 145 MG tablet TAKE 1 TABLET BY MOUTH  DAILY (Patient taking differently: Take 145 mg by mouth daily with lunch.) 90 tablet 3   Multiple Vitamin (MULTIVITAMIN WITH MINERALS) TABS tablet Take 1 tablet by mouth daily.     pantoprazole (PROTONIX) 40 MG tablet TAKE 1 TABLET BY MOUTH  DAILY (Patient taking differently: Take 40 mg by mouth daily before lunch.) 90 tablet 3   tadalafil (CIALIS) 20 MG tablet Take 20 mg by mouth daily  as needed.     valsartan-hydrochlorothiazide (DIOVAN-HCT) 320-25 MG tablet TAKE 1 TABLET BY MOUTH DAILY 90 tablet 3    Physical Exam: Blood pressure (!) 148/88, pulse (!) 57, temperature 98.3 F (36.8 C), resp. rate 17, height 6' (1.829 m), weight 108.9 kg, SpO2 97%. Gen: NAD Abd: Soft, non-distended, non-tender Groin: TTP at the insertion of the left adductor longus  No results found for this or any previous visit (from the past 48 hour(s)). No results found.  Assessment/Plan 47 y/o M who presents for elective sport's hernia repair  - Will proceed to the OR for the planned procedure.  We discussed the alternatives and potential risks of surgery, including but not limited to: bleeding, infection, damage to bowel or surrounding structures, mesh complications,  chronic pain, leg weakness or numbness, need for additional procedures or interventions. All questions were addressed and consent was obtained.   - Tentative plan for discharge from PACU  [facility] 10/23/2023, 12:33 PM Please see Amion for pager number during day hours 7:00am-4:30pm or 7:00am -11:30am on weekends

## 2023-10-23 NOTE — Discharge Instructions (Addendum)
Home Care After Hernia Repair   Activity  Limit activity for the first 24 hours, and then you will need to limit lifting with the lower extremity for at least 2 weeks.   No heavy lifting pushing or pulling, anything heavier than 10 pounds (gallon of milk weighs approx. 8.8 pounds) for 4-6 weeks from surgery date.  Do not mow the lawn, use a vacuum cleaner, or do any other strenuous activities without first consulting your surgical team.  Climb stairs slowly and watch your step.  Walk as often as you feel able to increase strength and endurance.  No driving or operating heavy machinery within 24 hours of taking narcotic pain medication..  Diet  Drink plenty of fluids and eat a light meal on the night of surgery. Some patients may find their appetite is poor for a week or two after surgery. This is a normal result of the stress of surgery-your appetite will return in time.   There are no specific diet restrictions after surgery.  Dressings and Wound Care  Keep your wound or incision site clean and dry.  You may have different types of dressings covering your incisions depending on your operation and your surgeon: o Dermabond/Durabond (skin glue): This will usually remain in place for 10-14 days, then naturally fall off your skin. You may take a shower 24 hrs after surgery, carefully wash, not scrub the incision site with a mild non-scented soap. Pat dry with a soft towel.  Do not pick or peel skin glue off.  Do not use creams, powder, salves or balms on your incision(s).  What to Expect After Surgery   Moderate discomfort controlled with medications  Minimal drainage from incision  You may feel pain in one or both shoulders. This pain comes from the gas still left in your belly after the surgery, if you had laparoscopic surgery (several small incisions). The pain should ease over several days to a week. Ambulation will help with this pain.   Belly swelling  Feeling fatigue and  weak  Constipation after surgery is common. Drink plenty fluids and eat a high fiber diet.  Swelling - In some patients might feel that their hernia has returned after surgery-DO NOT Worry this is normal. Swelling may be due to the development of a seroma. Seroma is fluid that has built up where the hernia was repaired this is a normal result after surgery and it will slowly reabsorb back into your body over the next several weeks.   (MALE PATIENTS ONLY)-esp following inguinal hernia repair, it is expected that your scrotum may be slightly swollen or tender. Along with oral NSAIDS medications you can apply ice packs, wear compression shorts, and/or elevate scrotum using a rolled up towel.  Also, a bladder catheter may have been placed during your surgery.  Because of this, it may hurt to urinate for a couple of days or you may pass some blood clots. These issues are expected and will go away after a few days. Please notify surgeon or report to Emergency Dept. if you are unable to urinate or your symptoms worsen.  Pain Control: Prescribed Non-Narcotic Pain Medication  You will be given three prescriptions.  Two of them will be for prescription strength ibuprofen (i.e. Advil) and prescription strength acetaminophen (i.e. Tylenol).  The vast majority of patients will just need these two medications.  One prescription will be for a 'rescue' prescription of an oral narcotic (oxycodone).  You may fill this if needed.  You  will alternate taking the ibuprofen (600mg ) every 6 hours and also the acetaminophen (650mg ) every 6 hours so that you are taking one of those medications every 3 hours.  For example: o 0800 - take ibuprofen 600mg  o 1100 - take acetaminophen 650mg  o 1400 - take ibuprofen 600mg  o 1700 - take acetaminophen 650mg  o Etc.  Continue taking this alternating pattern of ibuprofen and acetaminophen for 3 days  If you cannot take one or the other of these medications, just take the one you can every  6 hours.  If you are comfortable at night, you don't have to wake up and take a medication.  If you are still uncomfortable after taking either ibuprofen or acetaminophen, try gentle stretching exercise and ice packs (a bag of frozen vegetables works great).  If you are still uncomfortable, you may fill the narcotic prescription of Oxycodone and take as directed.  Once you have completed these prescriptions, your pain level should be low enough to stop taking medications altogether or just use an over the counter medication (ibuprofen or acetaminophen) as needed.   Pain Control: Over the Counter Medications to take as needed:  Colace/Docusate: May be prescribed by your surgeon to prevent constipation caused by the combination of narcotics, effects of anesthesia, and decreased ambulation.  Hold for loose stools or diarrhea. Take 100 mg 1-2 times a day starting tonight.   Fiber: High fiber foods, extra liquids (water 9-13 cups/day) can also assist with constipation. Examples of high fiber foods are fruit, bran. Prune juice and water are also good liquids to drink.  Milk of Magnesia/Miralax:  If constipated despite take the over the counter stool softeners, you may take Milk of Magnesia or Miralax as directed on bottle to assist with constipation.     Pepcid/Famotidine: May be prescribed while taking naproxen (Aleve) or other NSAIDs such as ibuprofen (Motrin/Advil) to prevent stomach upset or Acid-reflux symptoms. Take 1 tablet 1-2 times a day.   **Constipation: The first bowel movement may occur anywhere between 1-5 days after surgery.  As long as you are not nauseated or not having significant abdominal pain this variation is acceptable.  Narcotic pain medications can cause constipation increasing discomfort; early discontinuation will assist with bowel management.If constipated despite taking stool softeners, you may take Milk of Magnesia or Miralax as directed on the bottle.     **Home medications:  You may restart your home medications as directed by your respective Primary Care Physician or Surgeon.  When to notify your Doctor or Healthcare Team:   Sign of Wound Infection   Fever over 100 degrees.  Wound becomes extremely swollen, shows red streaks, warm to the touch, and/or drainage from the incision site or foul-smelling drainage.  Wound edges separate or opens up  Bleeding or bruising   If you have bleeding, apply pressure to the site and hold the pressure firmly for 5 minutes. If the bleeding continues, apply pressure again and call 911. If the bleeding stopped, call your doctor to report it.   Call your doctor or nurse if you have increased bleeding from your site and increased bruising or a lump forms or gets larger under your skin at the site.  Unrelieved Pain    Call your doctor or nurse if your pain gets worse or is not eased 1 hour after taking your pain medicine, or if it is severe and uncontrolled.  Nausea and Vomiting   Call your doctor or nurse if you have nausea and vomiting that  continues more than 24 hours, will not let you keep medicine down and will not let you keep fluids down  Fever, Flu-like symptoms   Fever over 100 degrees and/or chills  Gastrointestinal Bleeding Symptoms    Black tarry bowel movements.  This can be normal after surgery on the stomach, but should resolve in a day or two.    Call 911 if you suddenly have signs of blood loss such as:  Vomiting blood  Fast heart rate  Feeling faint, sweaty, or blacking out  Passing bright red blood from your rectum  Blood Clot Symptoms   Tender, swollen or reddened areas in your calf muscle or thighs.  Numbness or tingling in your lower leg or calf, or at the top of your leg or groin  Skin on your leg looks pale or blue or feels cold to touch  Chest pain or have trouble breathing, lightheadedness, fast heart rate  Sudden Onset of Symptoms    Call 911 if you suddenly have:  Leg weakness and  spasm  Loss of bladder or bowel function  Seizure  Confusion, severe headache, dizziness or feeling unsteady, problems talking, difficulty swallowing, and/or numbness or muscle weakness as these could be signs of a stroke.   Follow up Appointment Your follow up appointment should be scheduled 2-3 weeks after your surgery date.  If you have not previously scheduled for a follow-up visit you can be scheduled by contacting [phone].

## 2023-10-23 NOTE — Transfer of Care (Signed)
Immediate Anesthesia Transfer of Care Note  Patient: Christopher [name]  Procedure(s) Performed: XI ROBOTIC ASSISTED LEFT INGUINAL HERNIA REPAIR  LEFT ADDUCTOR RELEASE (Left: Abdomen) INSERTION OF MESH (Left: Inguinal)  Patient Location: PACU  Anesthesia Type:General  Level of Consciousness: drowsy  Airway & Oxygen Therapy: Patient Spontanous Breathing and Patient connected to face mask oxygen  Post-op Assessment: Report given to RN and Post -op Vital signs reviewed and stable  Post vital signs: Reviewed and stable  Last Vitals:  Vitals Value Taken Time  BP 121/73 10/23/23 1545  Temp    Pulse 68 10/23/23 1546  Resp 16 10/23/23 1546  SpO2 95 % 10/23/23 1546  Vitals shown include unfiled device data.  Last Pain:  Vitals:   10/23/23 1255  PainSc: 0-No pain         Complications: No notable events documented.

## 2023-10-23 NOTE — Op Note (Signed)
Christopher [name] ([id number])  Operative Report   Date 10/23/2023  PREOPERATIVE DIAGNOSIS:  Sport's hernia Adductor longus avulsion    POSTOPERATIVE DIAGNOSIS: Sasme  PROCEDURE:  Robotic left Inguinal Hernia Repair with Mesh (Bard 3D Midweight L Polypropylene Mesh) Left adductor longus tenotomy  SURGEON: Melody Haver, MD ASSISTANT: Karie Soda, MD  ANESTHESIA: General Anesthesia    INTRAOPERATIVE FINDINGS: No significant abnormalities Implant Name Type Inv. Item Serial No. Manufacturer Lot No. LRB No. Used Action  MESH 3DMAX MID 4X6 LT LRG - [id number] Mesh General MESH 3DMAX MID 4X6 LT LRG  [employer] BARD ACCESS [id number] Left 1 Implanted    ESTIMATED BLOOD LOSS: Minimal   COMPLICATIONS: None  SPECIMENS: None  OPERATIVE INDICATIONS: Pt is a 47 y.o. male who had left-sided groin pain that failed to improve after physical therapy and steroid injections.  He underwent an MRI that discovered left-sided adductor longus partial avulsion and a sport's hernia. The patient desires definitive repair.  The procedure's risks, benefits, and alternatives were explained to the patient.  Risks, including the risks of bleeding, infection, need for mesh removal, and potential for ongoing groin pain, were discussed.  The patient agreed to proceed and signed informed consent in front of a witness.   DESCRIPTION OF PROCEDURE: After preoperatively identifying the patient in holding, the patient was brought to the operating room and placed supine on the operating room table.  Both arms were tucked and padded to avoid potential nerve injury.  Sequential Compression Devices were placed bilaterally.  After induction of general anesthesia, the patient was given the appropriate perioperative antibiotics. The left groin was prepped and draped in standard fashion.  A incision was made in the left groin crease, overlying the tendon of the adductor longus muscle.  The tendon was dissected free from  surrounding tissues using electrocautery.  The tendon was then isolated within a Kelly clamp and divided. The wound was irrigated with sterile saline and inspected for hemostasis.  The wound was closed using interrupted 3-0 vicryl for the deep layers and a running 4-0 monocryl for the dermis. Dermabond was applied. We then turned out attention to the left inguinal hernia repair.   The abdomen was prepped and draped in a typical sterile fashion.  A JACHO approved time out, where the name of the patient, the operation, and the intended site were confirmed.  The procedure was begun.  Abdominal access was gained in the LUQ at Palmer's point. Following aspiration of air and a positive saline drop test, insufflation was established.  An 8 mm optical port was placed under direct visualization in the LLQ.  A camera was introduced into the abdomen, and a thorough inspection of the abdomen was performed to confirm there was no additional pathology.  Two additional 8 mm robotic ports were placed superior to the umbilicus and in the RLQ.  The patient was then placed into 15-degree Trendelenburg position to facilitate examination of the bilateral inguinal spaces.  A thorough inspection of the abdomen was undertaken.   The [employer] robot was brought into the field and docked.  An 8 mm 30-degree scope was placed in the mid-abdominal port.  The peritoneum was taken down 6 cm superior to the left internal ring, and dissection was taken inferiorly.  Medial to the epigastric vessels, the parietal compartment was dissected and completed to visualize the rectus muscle.  This dissection was carried down to the symphysis pubis and obturator foramen.  The retropubic space was dissected to expose at  least 2 cm contralateral to the midline.  Cooper's ligament was exposed and cleared at least 2 cm below the ligament to ensure adequate space for the inferior border of the mesh.  Hesselbach's triangle was cleared, assessing for a direct  hernia. There was no direct hernia. Lateral to the epigastric vessels, the dissection was carried out into the visceral compartment, continuing in the true preperitoneal plane.  The peritoneum was carefully reduced and separated from the cord structures with medial retraction and a combination of blunt/sharp dissection and focused cautery.  This dissection continued until the cord structures were parietalized completely, allowing for continuous visualization of the reflected peritoneum with the line originating 2 cm below Cooper's medially and across the psoas muscle in the lateral compartment.  The internal ring was interrogated for a cord lipoma.  The cord lipoma was reduced to the retroperitoneum and seated dorsal to the preperitoneal mesh.  Having achieved a complete dissection with a critical view of the entire myopectineal orifice, a piece of Bard 3D midweight L Polypropylene Mesh was introduced into the field.  It was centered at the iliopubic tract, with the medial side crossing the midline and the inferior edge positioned 2 cm below Cooper's ligament.  With complete coverage of the myopectineal orifice, the inferior edge of the peritoneum was posterior and inferior to the mesh.  The lateral aspect of the mesh extended 3-5 cm beyond the lateral edge of the psoas.  Cephalad retraction of the peritoneal flap did not cause lifting of the inferior mesh edge or cord structures.  The mesh was fixated using an interrupted 3-0 Vicryl suture placed to the ipsilateral Coopers ligament.  The peritoneal flap was closed with a running 3-0 Stratafix barbed suture.  Additional holes in the peritoneum closed.  Hemostasis was assured in the entirety of the abdomen.  The two lateral ports were removed under direct visualization.  The abdomen was desufflated under direct visualization.  The port sites were closed, local anesthetic was infiltrated, and Dermabond was applied.  After confirming twice that the sponge, needle,  and instrument counts were correct, the procedure was terminated, the patient was extubated and transferred to the recovery room in stable condition.     DISPOSITION: Stable to PACU.   Electronically Signed By: [name] 10/23/2023 3:45 PM

## 2023-10-23 NOTE — Anesthesia Procedure Notes (Signed)
Anesthesia Regional Block: TAP block   Pre-Anesthetic Checklist: , timeout performed,  Correct Patient, Correct Site, Correct Laterality,  Correct Procedure, Correct Position, site marked,  Risks and benefits discussed,  Surgical consent,  Pre-op evaluation,  At surgeon's request and post-op pain management  Laterality: Left  Prep: chloraprep       Needles:  Injection technique: Single-shot  Needle Type: Echogenic Stimulator Needle     Needle Length: 9cm  Needle Gauge: 21     Additional Needles:   Procedures:,,,, ultrasound used (permanent image in chart),,    Narrative:  Start time: 10/23/2023 12:50 PM End time: 10/23/2023 12:55 PM Injection made incrementally with aspirations every 5 mL.  Performed by: Personally  Anesthesiologist: [place], MD  Additional Notes: Discussed risks and benefits of the nerve block in detail, including but not limited vascular injury, permanent nerve damage and infection.   Patient tolerated the procedure well. Local anesthetic introduced in an incremental fashion under minimal resistance after negative aspirations. No paresthesias were elicited. After completion of the procedure, no acute issues were identified and patient continued to be monitored by RN.

## 2023-10-23 NOTE — Anesthesia Postprocedure Evaluation (Signed)
Anesthesia Post Note  Patient: Christopher [name]  Procedure(s) Performed: XI ROBOTIC ASSISTED LEFT INGUINAL HERNIA REPAIR  LEFT ADDUCTOR RELEASE (Left: Abdomen) INSERTION OF MESH (Left: Inguinal)     Patient location during evaluation: PACU Anesthesia Type: General Level of consciousness: awake and alert, oriented and patient cooperative Pain management: pain level controlled Vital Signs Assessment: post-procedure vital signs reviewed and stable Respiratory status: spontaneous breathing, nonlabored ventilation and respiratory function stable Cardiovascular status: blood pressure returned to baseline and stable Postop Assessment: no apparent nausea or vomiting Anesthetic complications: no   No notable events documented.  Last Vitals:  Vitals:   10/23/23 1545 10/23/23 1600  BP: 121/73 119/71  Pulse: 67 67  Resp: 17 15  Temp: 36.9 C   SpO2: 95% 96%    Last Pain:  Vitals:   10/23/23 1545  PainSc: Asleep                 Lannie Fields

## 2023-10-23 NOTE — Anesthesia Procedure Notes (Addendum)
Procedure Name: Intubation Date/Time: 10/23/2023 1:25 PM  Performed by: Flonnie Hailstone, CRNAPre-anesthesia Checklist: Patient identified, Emergency Drugs available, Suction available and Patient being monitored Patient Re-evaluated:Patient Re-evaluated prior to induction Oxygen Delivery Method: Circle system utilized Preoxygenation: Pre-oxygenation with 100% oxygen Induction Type: IV induction Ventilation: Mask ventilation without difficulty Laryngoscope Size: Mac and 4 Grade View: Grade I Tube type: Oral Tube size: 7.5 mm Number of attempts: 1 Airway Equipment and Method: Stylet and Oral airway Placement Confirmation: ETT inserted through vocal cords under direct vision, positive ETCO2 and breath sounds checked- equal and bilateral Secured at: 24 cm Tube secured with: Tape Dental Injury: Teeth and Oropharynx as per pre-operative assessment  Comments: Intubated by Ashok Pall; ebbs

## 2023-10-24 ENCOUNTER — Encounter (HOSPITAL_COMMUNITY): Payer: Self-pay | Admitting: General Surgery

## 2023-11-07 ENCOUNTER — Other Ambulatory Visit: Payer: Self-pay | Admitting: Internal Medicine

## 2024-01-02 ENCOUNTER — Encounter: Payer: Self-pay | Admitting: Internal Medicine

## 2024-01-02 NOTE — Progress Notes (Signed)
 Subjective:    Patient ID: Christopher [name], male    DOB: 1976-09-28, 48 y.o.   MRN: [id number]     HPI N[place]ome is here for a physical exam and his chronic medical problems.   Has always had stomach issues.     Medications and allergies reviewed with patient and updated if appropriate.  Current Outpatient Medications on File Prior to Visit  Medication Sig Dispense Refill   amLODipine  (NORVASC ) 10 MG tablet TAKE 1 TABLET BY MOUTH DAILY 90 tablet 3   fenofibrate  (TRICOR ) 145 MG tablet TAKE 1 TABLET BY MOUTH DAILY 90 tablet 3   Multiple Vitamin (MULTIVITAMIN WITH MINERALS) TABS tablet Take 1 tablet by mouth daily.     pantoprazole  (PROTONIX ) 40 MG tablet TAKE 1 TABLET BY MOUTH DAILY 90 tablet 3   tadalafil (CIALIS) 20 MG tablet Take 20 mg by mouth daily as needed.     testosterone cypionate (DEPOTESTOSTERONE CYPIONATE) 200 MG/ML injection SMARTSIG:0.25 Milliliter(s) SUB-Q Twice a Week     valsartan -hydrochlorothiazide  (DIOVAN -HCT) 320-25 MG tablet TAKE 1 TABLET BY MOUTH DAILY 90 tablet 3   No current facility-administered medications on file prior to visit.    Review of Systems  Constitutional:  Negative for fever.  HENT:  Positive for postnasal drip.   Eyes:  Negative for visual disturbance.  Respiratory:  Negative for cough (mild), shortness of breath and wheezing.   Cardiovascular:  Positive for leg swelling (ankles). Negative for chest pain and palpitations.  Gastrointestinal:  Positive for abdominal pain (cramping, spasms prior to BM) and diarrhea (85% loose or diarrhea - urgency after eating - usually 2 times/day). Negative for blood in stool and constipation.       Gerd controlled  Genitourinary:  Positive for difficulty urinating (new). Negative for dysuria and hematuria.  Musculoskeletal:  Positive for arthralgias (mild knees). Negative for back pain.  Skin:  Negative for rash.  Neurological:  Negative for light-headedness and headaches.  Psychiatric/Behavioral:   Negative for dysphoric mood. The patient is not nervous/anxious.        Objective:   Vitals:   01/03/24 0750  BP: 122/78  Pulse: 78  Temp: 98.4 F (36.9 C)  SpO2: 96%   Filed Weights   01/03/24 0750  Weight: 235 lb (106.6 kg)   Body mass index is 31.87 kg/m.  BP Readings from Last 3 Encounters:  01/03/24 122/78  10/23/23 124/79  10/18/23 137/80    Wt Readings from Last 3 Encounters:  01/03/24 235 lb (106.6 kg)  10/23/23 240 lb (108.9 kg)  10/18/23 241 lb 4.8 oz (109.5 kg)      Physical Exam Constitutional: He appears well-developed and well-nourished. No distress.  HENT:  Head: Normocephalic and atraumatic.  Right Ear: External ear normal.  Left Ear: External ear normal.  Normal ear canals and TM b/l  Mouth/Throat: Oropharynx is clear and moist. Eyes: Conjunctivae and EOM are normal.  Neck: Neck supple. No tracheal deviation present. No thyromegaly present.  No carotid bruit  Cardiovascular: Normal rate, regular rhythm, normal heart sounds and intact distal pulses.   No murmur heard.  No lower extremity edema. Pulmonary/Chest: Effort normal and breath sounds normal. No respiratory distress. He has no wheezes. He has no rales.  Abdominal: Soft. He exhibits no distension. There is no tenderness.  Genitourinary: deferred  Lymphadenopathy:   He has no cervical adenopathy.  Skin: Skin is warm and dry. He is not diaphoretic.  Psychiatric: He has a normal mood and affect. His behavior  is normal.         Assessment & Plan:   Physical exam: Screening blood work  ordered Exercise   regular - gym Weight  obese Substance abuse   none   Reviewed recommended immunizations.   Health Maintenance  Topic Date Due   COVID-19 Vaccine (3 - 2024-25 season) 01/19/2024 (Originally 07/29/2023)   INFLUENZA VACCINE  02/25/2024 (Originally 06/28/2023)   DTaP/Tdap/Td (3 - Td or Tdap) 06/12/2024   Colonoscopy  10/05/2032   Hepatitis C Screening  Completed   HIV Screening   Completed   HPV VACCINES  Aged Out     See Problem List for Assessment and Plan of chronic medical problems.

## 2024-01-02 NOTE — Patient Instructions (Addendum)
 Blood work was ordered.       Medications changes include :   None      Return in about 6 months (around 07/02/2024) for follow up.   Health Maintenance, Male Adopting a healthy lifestyle and getting preventive care are important in promoting health and wellness. Ask your health care provider about: The right schedule for you to have regular tests and exams. Things you can do on your own to prevent diseases and keep yourself healthy. What should I know about diet, weight, and exercise? Eat a healthy diet  Eat a diet that includes plenty of vegetables, fruits, low-fat dairy products, and lean protein. Do not eat a lot of foods that are high in solid fats, added sugars, or sodium. Maintain a healthy weight Body mass index (BMI) is a measurement that can be used to identify possible weight problems. It estimates body fat based on height and weight. Your health care provider can help determine your BMI and help you achieve or maintain a healthy weight. Get regular exercise Get regular exercise. This is one of the most important things you can do for your health. Most adults should: Exercise for at least 150 minutes each week. The exercise should increase your heart rate and make you sweat (moderate-intensity exercise). Do strengthening exercises at least twice a week. This is in addition to the moderate-intensity exercise. Spend less time sitting. Even light physical activity can be beneficial. Watch cholesterol and blood lipids Have your blood tested for lipids and cholesterol at 49 years of age, then have this test every 5 years. You may need to have your cholesterol levels checked more often if: Your lipid or cholesterol levels are high. You are older than 49 years of age. You are at high risk for heart disease. What should I know about cancer screening? Many types of cancers can be detected early and may often be prevented. Depending on your health history and family  history, you may need to have cancer screening at various ages. This may include screening for: Colorectal cancer. Prostate cancer. Skin cancer. Lung cancer. What should I know about heart disease, diabetes, and high blood pressure? Blood pressure and heart disease High blood pressure causes heart disease and increases the risk of stroke. This is more likely to develop in people who have high blood pressure readings or are overweight. Talk with your health care provider about your target blood pressure readings. Have your blood pressure checked: Every 3-5 years if you are 63-59 years of age. Every year if you are 63 years old or older. If you are between the ages of 58 and 81 and are a current or former smoker, ask your health care provider if you should have a one-time screening for abdominal aortic aneurysm (AAA). Diabetes Have regular diabetes screenings. This checks your fasting blood sugar level. Have the screening done: Once every three years after age 65 if you are at a normal weight and have a low risk for diabetes. More often and at a younger age if you are overweight or have a high risk for diabetes. What should I know about preventing infection? Hepatitis B If you have a higher risk for hepatitis B, you should be screened for this virus. Talk with your health care provider to find out if you are at risk for hepatitis B infection. Hepatitis C Blood testing is recommended for: Everyone born from [birth year] through 1966. Anyone with known risk factors for hepatitis C. Sexually  transmitted infections (STIs) You should be screened each year for STIs, including gonorrhea and chlamydia, if: You are sexually active and are younger than 49 years of age. You are older than 49 years of age and your health care provider tells you that you are at risk for this type of infection. Your sexual activity has changed since you were last screened, and you are at increased risk for chlamydia or  gonorrhea. Ask your health care provider if you are at risk. Ask your health care provider about whether you are at high risk for HIV. Your health care provider may recommend a prescription medicine to help prevent HIV infection. If you choose to take medicine to prevent HIV, you should first get tested for HIV. You should then be tested every 3 months for as long as you are taking the medicine. Follow these instructions at home: Alcohol use Do not drink alcohol if your health care provider tells you not to drink. If you drink alcohol: Limit how much you have to 0-2 drinks a day. Know how much alcohol is in your drink. In the [place] one drink equals one 12 oz bottle of beer (355 mL), one 5 oz glass of wine (148 mL), or one 1 oz glass of hard liquor (44 mL). Lifestyle Do not use any products that contain nicotine or tobacco. These products include cigarettes, chewing tobacco, and vaping devices, such as e-cigarettes. If you need help quitting, ask your health care provider. Do not use street drugs. Do not share needles. Ask your health care provider for help if you need support or information about quitting drugs. General instructions Schedule regular health, dental, and eye exams. Stay current with your vaccines. Tell your health care provider if: You often feel depressed. You have ever been abused or do not feel safe at home. Summary Adopting a healthy lifestyle and getting preventive care are important in promoting health and wellness. Follow your health care provider's instructions about healthy diet, exercising, and getting tested or screened for diseases. Follow your health care provider's instructions on monitoring your cholesterol and blood pressure. This information is not intended to replace advice given to you by your health care provider. Make sure you discuss any questions you have with your health care provider. Document Revised: 04/04/2021 Document Reviewed: 04/04/2021 Elsevier  Patient Education  2024 [employer].

## 2024-01-03 ENCOUNTER — Encounter: Payer: Self-pay | Admitting: Internal Medicine

## 2024-01-03 ENCOUNTER — Ambulatory Visit (INDEPENDENT_AMBULATORY_CARE_PROVIDER_SITE_OTHER): Payer: 59 | Admitting: Internal Medicine

## 2024-01-03 VITALS — BP 122/78 | HR 78 | Temp 98.4°F | Ht 72.0 in | Wt 235.0 lb

## 2024-01-03 DIAGNOSIS — Z0000 Encounter for general adult medical examination without abnormal findings: Secondary | ICD-10-CM | POA: Diagnosis not present

## 2024-01-03 DIAGNOSIS — K760 Fatty (change of) liver, not elsewhere classified: Secondary | ICD-10-CM

## 2024-01-03 DIAGNOSIS — I10 Essential (primary) hypertension: Secondary | ICD-10-CM

## 2024-01-03 DIAGNOSIS — E781 Pure hyperglyceridemia: Secondary | ICD-10-CM | POA: Diagnosis not present

## 2024-01-03 DIAGNOSIS — R739 Hyperglycemia, unspecified: Secondary | ICD-10-CM | POA: Diagnosis not present

## 2024-01-03 DIAGNOSIS — K219 Gastro-esophageal reflux disease without esophagitis: Secondary | ICD-10-CM

## 2024-01-03 LAB — COMPREHENSIVE METABOLIC PANEL
ALT: 67 U/L — ABNORMAL HIGH (ref 0–53)
AST: 43 U/L — ABNORMAL HIGH (ref 0–37)
Albumin: 4.3 g/dL (ref 3.5–5.2)
Alkaline Phosphatase: 34 U/L — ABNORMAL LOW (ref 39–117)
BUN: 12 mg/dL (ref 6–23)
CO2: 30 meq/L (ref 19–32)
Calcium: 9.2 mg/dL (ref 8.4–10.5)
Chloride: 101 meq/L (ref 96–112)
Creatinine, Ser: 1.15 mg/dL (ref 0.40–1.50)
GFR: 75.37 mL/min (ref >60.00)
Glucose, Bld: 95 mg/dL (ref 70–99)
Potassium: 3.7 meq/L (ref 3.5–5.1)
Sodium: 141 meq/L (ref 135–145)
Total Bilirubin: 1 mg/dL (ref 0.2–1.2)
Total Protein: 7.1 g/dL (ref 6.0–8.3)

## 2024-01-03 LAB — LIPID PANEL
Cholesterol: 149 mg/dL (ref 0–200)
HDL: 36.5 mg/dL — ABNORMAL LOW (ref >39.00)
LDL Cholesterol: 86 mg/dL (ref 0–99)
NonHDL: 112.62
Total CHOL/HDL Ratio: 4
Triglycerides: 134 mg/dL (ref 0.0–149.0)
VLDL: 26.8 mg/dL (ref 0.0–40.0)

## 2024-01-03 LAB — CBC WITH DIFFERENTIAL/PLATELET
Basophils Absolute: 0 10*3/uL (ref 0.0–0.1)
Basophils Relative: 0.8 % (ref 0.0–3.0)
Eosinophils Absolute: 0.2 10*3/uL (ref 0.0–0.7)
Eosinophils Relative: 5.3 % — ABNORMAL HIGH (ref 0.0–5.0)
HCT: 43.4 % (ref 39.0–52.0)
Hemoglobin: 14.9 g/dL (ref 13.0–17.0)
Lymphocytes Relative: 29.6 % (ref 12.0–46.0)
Lymphs Abs: 1.2 10*3/uL (ref 0.7–4.0)
MCHC: 34.4 g/dL (ref 30.0–36.0)
MCV: 89.6 fL (ref 78.0–100.0)
Monocytes Absolute: 0.5 10*3/uL (ref 0.1–1.0)
Monocytes Relative: 12.5 % — ABNORMAL HIGH (ref 3.0–12.0)
Neutro Abs: 2.1 10*3/uL (ref 1.4–7.7)
Neutrophils Relative %: 51.8 % (ref 43.0–77.0)
Platelets: 311 10*3/uL (ref 150.0–400.0)
RBC: 4.84 Mil/uL (ref 4.22–5.81)
RDW: 14.2 % (ref 11.5–15.5)
WBC: 4.1 10*3/uL (ref 4.0–10.5)

## 2024-01-03 LAB — HEMOGLOBIN A1C: Hgb A1c MFr Bld: 5.4 % (ref 4.6–6.5)

## 2024-01-03 MED ORDER — AMLODIPINE BESYLATE 5 MG PO TABS: 5.0000 mg | ORAL_TABLET | Freq: Every day | ORAL | 1 refills | Status: DC

## 2024-01-03 MED ORDER — VALSARTAN-HYDROCHLOROTHIAZIDE 320-25 MG PO TABS: 1.0000 | ORAL_TABLET | Freq: Every day | ORAL | 3 refills | Status: AC

## 2024-01-03 NOTE — Assessment & Plan Note (Signed)
 Chronic Lab Results  Component Value Date   HGBA1C 5.6 07/02/2023   Check a1c Low sugar / carb diet Stressed regular exercise

## 2024-01-03 NOTE — Assessment & Plan Note (Signed)
 Chronic GERD controlled Continue pantoprazole 40 mg daily

## 2024-01-03 NOTE — Assessment & Plan Note (Addendum)
 Chronic BP well controlled, has ankle swelling Will try decreasing amlodipine  dose to 5 mg CMP, CBC Continue diovan -hct 320-25 mg daily Monitor BP at home

## 2024-01-03 NOTE — Assessment & Plan Note (Signed)
Chronic Check lipid panel, CMP Continue fenofibrate 145 mg daily Regular exercise and healthy diet encouraged Weight loss encouraged

## 2024-01-03 NOTE — Assessment & Plan Note (Addendum)
 Chronic CMP, CBC continue regular exercise, healthy diet Working on weight loss

## 2024-01-28 ENCOUNTER — Encounter: Payer: Self-pay | Admitting: Internal Medicine

## 2024-01-28 NOTE — Telephone Encounter (Signed)
 Pt wondering if GI referral would be w [facility] and if so he can reach out bc he has seen in the past?

## 2024-01-31 ENCOUNTER — Ambulatory Visit: Admitting: Internal Medicine

## 2024-03-06 ENCOUNTER — Encounter: Payer: Self-pay | Admitting: Internal Medicine

## 2024-03-07 NOTE — Telephone Encounter (Signed)
 Scheduled with Dr. Deborah Chalk Med 03/12/2024.

## 2024-03-11 NOTE — Progress Notes (Unsigned)
    Christopher [name] D.[name] [address] [place] [address] Phone: [phone]   Assessment and Plan:     There are no diagnoses linked to this encounter.  ***   Pertinent previous records reviewed include ***    Follow Up: ***     Subjective:   I, Christopher [name], am serving as a Neurosurgeon for Doctor Christopher [name]  Chief Complaint: low back pain   HPI:   03/12/2024 Patient is a 48 year old male with low back pain. Patient states  Relevant Historical Information: ***  Additional pertinent review of systems negative.   Current Outpatient Medications:    amLODipine (NORVASC) 5 MG tablet, Take 1 tablet (5 mg total) by mouth daily., Disp: 90 tablet, Rfl: 1   fenofibrate (TRICOR) 145 MG tablet, TAKE 1 TABLET BY MOUTH DAILY, Disp: 90 tablet, Rfl: 3   Multiple Vitamin (MULTIVITAMIN WITH MINERALS) TABS tablet, Take 1 tablet by mouth daily., Disp: , Rfl:    pantoprazole (PROTONIX) 40 MG tablet, TAKE 1 TABLET BY MOUTH DAILY, Disp: 90 tablet, Rfl: 3   tadalafil (CIALIS) 20 MG tablet, Take 20 mg by mouth daily as needed., Disp: , Rfl:    testosterone cypionate (DEPOTESTOSTERONE CYPIONATE) 200 MG/ML injection, SMARTSIG:0.25 Milliliter(s) SUB-Q Twice a Week, Disp: , Rfl:    valsartan-hydrochlorothiazide (DIOVAN-HCT) 320-25 MG tablet, Take 1 tablet by mouth daily., Disp: 90 tablet, Rfl: 3   Objective:     There were no vitals filed for this visit.    There is no height or weight on file to calculate BMI.    Physical Exam:    ***   Electronically signed by:  Christopher [name] D.[name] 7:48 AM 03/11/24

## 2024-03-12 ENCOUNTER — Ambulatory Visit (INDEPENDENT_AMBULATORY_CARE_PROVIDER_SITE_OTHER)

## 2024-03-12 ENCOUNTER — Ambulatory Visit: Admitting: Sports Medicine

## 2024-03-12 VITALS — BP 132/82 | HR 73 | Ht 72.0 in | Wt 236.0 lb

## 2024-03-12 DIAGNOSIS — G8929 Other chronic pain: Secondary | ICD-10-CM

## 2024-03-12 DIAGNOSIS — M545 Low back pain, unspecified: Secondary | ICD-10-CM | POA: Diagnosis not present

## 2024-03-12 DIAGNOSIS — M5441 Lumbago with sciatica, right side: Secondary | ICD-10-CM

## 2024-03-12 DIAGNOSIS — M5431 Sciatica, right side: Secondary | ICD-10-CM

## 2024-03-12 NOTE — Patient Instructions (Signed)
 Hamstring HEP Tylenol and IBU as needed Try to carry wallet in another pocket See me as needed

## 2024-03-19 ENCOUNTER — Ambulatory Visit: Admitting: Gastroenterology

## 2024-03-19 ENCOUNTER — Other Ambulatory Visit

## 2024-03-19 ENCOUNTER — Encounter: Payer: Self-pay | Admitting: Gastroenterology

## 2024-03-19 VITALS — BP 130/78 | HR 67 | Ht 72.0 in | Wt 235.6 lb

## 2024-03-19 DIAGNOSIS — D509 Iron deficiency anemia, unspecified: Secondary | ICD-10-CM

## 2024-03-19 DIAGNOSIS — D8689 Sarcoidosis of other sites: Secondary | ICD-10-CM

## 2024-03-19 DIAGNOSIS — K529 Noninfective gastroenteritis and colitis, unspecified: Secondary | ICD-10-CM | POA: Diagnosis not present

## 2024-03-19 DIAGNOSIS — K753 Granulomatous hepatitis, not elsewhere classified: Secondary | ICD-10-CM | POA: Diagnosis not present

## 2024-03-19 DIAGNOSIS — K760 Fatty (change of) liver, not elsewhere classified: Secondary | ICD-10-CM

## 2024-03-19 NOTE — Patient Instructions (Signed)
 Your provider has requested that you go to the basement level for lab work before leaving today. Press "B" on the elevator. The lab is located at the first door on the left as you exit the elevator.  You have been given a testing kit to check for small intestine bacterial overgrowth (SIBO) which is completed by a company named [facility]. Make sure to return your test in the mail using the return mailing label given to you along with the kit. The test order, your demographic and insurance information have all already been sent to the company. [facility] will collect an upfront charge of $99.74 for commercial insurance plans and $209.74 if you are paying cash. Make sure to discuss with [facility] PRIOR to having the test to see if they have gotten information from your insurance company as to how much your testing will cost out of pocket, if any. Please contact [facility] at phone number [phone] to get instructions regarding how to perform the test as our office is unable to give specific testing instructions.  _______________________________________________________  If your blood pressure at your visit was 140/90 or greater, please contact your primary care physician to follow up on this.  _______________________________________________________  If you are age 26 or older, your body mass index should be between 23-30. Your Body mass index is 31.95 kg/m. If this is out of the aforementioned range listed, please consider follow up with your Primary Care Provider.  If you are age 74 or younger, your body mass index should be between 19-25. Your Body mass index is 31.95 kg/m. If this is out of the aformentioned range listed, please consider follow up with your Primary Care Provider.   ________________________________________________________  The [facility] providers would like to encourage you to use MYCHART to communicate with providers for non-urgent requests or questions.   Due to long hold times on the telephone, sending your provider a message by [facility] may be a faster and more efficient way to get a response.  Please allow 48 business hours for a response.  Please remember that this is for non-urgent requests.  _______________________________________________________

## 2024-03-19 NOTE — Progress Notes (Signed)
 03/19/2024 Christopher [name] [id number] 06/19/1976   HISTORY OF PRESENT ILLNESS: This is a 48 year old male who is a patient of Dr. Jadene Maxwell.  He is here today with complaints of chronic diarrhea.  He says that he remembers having stomach issues like this since he was a teenager.  He says it is a running joke that within 10 to 15 minutes of eating he has to find a bathroom.  He says that he very rarely has a solid stool, usually just diarrhea with urgency.  Reports taking Librax  probably 30 years ago.  He says that he has been working over the past couple months with his diet, trying to have a healthier diet.  He says it does not usually matter what he eats though, this happens regularly.  No rectal bleeding.  Usually diarrhea is associated with abdominal cramping and urge to have a bowel movement.  Does not consume artificial sweeteners/alcohol sugars.  Sometimes will eat a bowl ice cream and not have any issues, sometimes will eat a little bit of milk on cereal and have diarrhea.  This tends to occur more so if he eats bigger meals rather than just a small snack.  Does have a history of pancreatitis on a couple occasions that he says was from alcohol.  Looks like last was probably October 2023.  Had an MRI February 2024 that showed normal-appearing pancreas.  Of note, he has followed with Paullette Boston for his liver, appears that he has hepatic steatosis and granulomatous hepatitis/hepatic sarcoidosis, not diagnosed with cirrhosis at this point as there was <F2 fibrosis seen on his liver biopsy.  EGD 09/2022:  - Normal esophagus. - Multiple gastric polyps. Biopsied. - Normal examined duodenum. - No cause of iron  deficiency found ( celiac serology previously negative)  Colonoscopy 09/2022:  - One diminutive polyp in the descending colon, removed with a cold snare. Resected and retrieved. - Internal hemorrhoids. - The examination was otherwise normal on direct and retroflexion views.  1.  Surgical [P], gastric body polyps FUNDIC GLAND POLYPS (3). 2. 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   Pain in lower limb 03/02/2014   Pancreatitis 09/04/2022   Past Surgical History:  Procedure Laterality Date   CARPAL TUNNEL RELEASE Right 2008   COLONOSCOPY     IBS   CYST EXCISION Right 2015   "2nd toe"   ESOPHAGOGASTRODUODENOSCOPY     INSERTION OF MESH Left 10/23/2023   Procedure: INSERTION OF MESH;  Surgeon: Cannon Champion, MD;  Location: [facility] OR;  Service: General;  Laterality: Left;   KNEE ARTHROSCOPY Right 1992   LASIK     REFRACTIVE SURGERY Bilateral 2013   SHOULDER SURGERY Left 10/2016   UPPER GI ENDOSCOPY     chest pain   WRIST FRACTURE SURGERY Right 2008   "got plate and screws"    reports that he has never smoked. He has never used smokeless tobacco. He  reports current alcohol use of about 8.0 - 10.0 standard drinks of alcohol per week. He reports that he does not use drugs. family history includes Alcohol abuse in his maternal grandfather and paternal grandfather; Alzheimer's disease in his maternal grandmother and paternal grandmother; Depression in his mother and sister; Heart disease in his maternal grandfather and maternal uncle; Heart failure in his maternal grandfather; Hyperlipidemia in his mother; Hypertension in his father and mother; Throat cancer in his paternal grandfather. Allergies  Allergen Reactions   Morphine  And Codeine Nausea And Vomiting and Other (See Comments)    Sweats, also       Outpatient Encounter Medications as of 03/19/2024  Medication Sig   amLODipine  (NORVASC ) 5 MG tablet Take 1 tablet (5 mg total) by mouth daily.   fenofibrate  (TRICOR ) 145 MG tablet TAKE 1 TABLET BY MOUTH DAILY   Multiple Vitamin (MULTIVITAMIN WITH MINERALS) TABS tablet Take 1 tablet by mouth daily.   pantoprazole  (PROTONIX ) 40 MG tablet TAKE 1 TABLET BY MOUTH DAILY   tadalafil (CIALIS) 20 MG tablet Take 20 mg by mouth daily as needed.   testosterone cypionate (DEPOTESTOSTERONE CYPIONATE) 200 MG/ML injection SMARTSIG:0.25 Milliliter(s) SUB-Q Twice a Week   valsartan -hydrochlorothiazide  (DIOVAN -HCT) 320-25 MG tablet Take 1 tablet by mouth daily.   No facility-administered encounter medications on file as of 03/19/2024.    REVIEW OF SYSTEMS  : All other systems reviewed and negative except where noted in the History of Present Illness.   PHYSICAL EXAM: BP 130/78   Pulse 67   Ht 6' (1.829 m)   Wt 235 lb 9.6 oz (106.9 kg)   BMI 31.95 kg/m  General: Well developed white male in no acute distress Head: Normocephalic and atraumatic Eyes:  Sclerae anicteric, conjunctiva pink. Ears: Normal auditory acuity Lungs: Clear throughout to auscultation; no W/R/R. Heart: Regular rate and rhythm; no M/R/G. Abdomen: Soft, non-distended.  BS present.  Non-tender. Musculoskeletal: Symmetrical with no gross deformities  Skin: No lesions on visible extremities Neurological: Alert oriented x 4, grossly non-focal Psychological:  Alert and cooperative. Normal mood and affect  ASSESSMENT AND PLAN: *48 year old male with complaints of chronic diarrhea: Suspected to have IBS.  Symptoms present since he was a teenager.  Has diarrhea with urgency very shortly after eating.  EGD and colonoscopy in 2023 were unremarkable.  Duodenal biopsies in 2004 were negative for celiac disease.  Reviewed medications and diet.  Nothing specifically stands out.  Will check SIBO breath test.  Obviously if this is positive  we will treat appropriately.  If negative could still consider course of Xifaxan for IBS-D.  Will check a pancreatic fecal elastase.  Viberzi would be an option as well.  We also discussed using Imodium regularly, titrating to appropriate daily dosing.  We discussed FODMAP diet and he may want to explore this as well.  Down the road if these things are unsuccessful could consider TCA.  ? Small bowel sarcoid in the differential as well. *Iron  deficiency anemia: This was evaluated a couple of times in the past, last in 2023 with endoscopic evaluations.  These were unrevealing.  He was donating blood somewhat regularly and has stopped doing that.  Looks like hemoglobin and iron  studies have recovered/normalized. 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   CC:  Colene Dauphin, MD

## 2024-03-25 ENCOUNTER — Other Ambulatory Visit

## 2024-03-25 DIAGNOSIS — K529 Noninfective gastroenteritis and colitis, unspecified: Secondary | ICD-10-CM

## 2024-03-26 ENCOUNTER — Encounter: Payer: Self-pay | Admitting: Sports Medicine

## 2024-03-31 LAB — PANCREATIC ELASTASE, FECAL: Pancreatic Elastase-1, Stool: >800 ug/g (ref >200)

## 2024-04-28 ENCOUNTER — Encounter: Payer: Self-pay | Admitting: Internal Medicine

## 2024-06-05 ENCOUNTER — Other Ambulatory Visit: Payer: Self-pay | Admitting: Internal Medicine

## 2024-07-02 ENCOUNTER — Ambulatory Visit: Payer: 59 | Admitting: Internal Medicine

## 2024-07-07 NOTE — Patient Instructions (Addendum)
    BP goal < 130/80   Blood work was ordered.       Medications changes include :   None      Return in about 6 months (around 01/08/2025) for Physical Exam.

## 2024-07-07 NOTE — Progress Notes (Signed)
 Subjective:    Patient ID: Christopher [name], male    DOB: 08-18-1976, 48 y.o.   MRN: [id number]     HPI Xiong is here for follow up of his chronic medical problems.  Bump on inside of nose x 2 weeks -- did e-visit.  Advised cream - did not help.  Went to urgent care - oral abx and ointment.   Finally getting better.   Exercising - gym 2-3 / week.  Has lost a little weight.   Eating more healthy - trying to cut out junk food, stopped drinking soda.  Drinking less alcohol.    Medications and allergies reviewed with patient and updated if appropriate.  Current Outpatient Medications on File Prior to Visit  Medication Sig Dispense Refill   amLODipine  (NORVASC ) 5 MG tablet TAKE 1 TABLET BY MOUTH DAILY 90 tablet 3   B-D INTEGRA SYRINGE 25G X 5/8 3 ML MISC NEEDLE 25 GAUGE X 5/8 NEEDLE, DISPOSABLE     cephALEXin (KEFLEX) 500 MG capsule Take 500 mg by mouth.     fenofibrate  (TRICOR ) 145 MG tablet TAKE 1 TABLET BY MOUTH DAILY 90 tablet 3   Multiple Vitamin (MULTIVITAMIN WITH MINERALS) TABS tablet Take 1 tablet by mouth daily.     mupirocin  ointment (BACTROBAN ) 2 % Apply topically.     pantoprazole  (PROTONIX ) 40 MG tablet TAKE 1 TABLET BY MOUTH DAILY 90 tablet 3   tadalafil (CIALIS) 20 MG tablet Take 20 mg by mouth daily as needed.     testosterone cypionate (DEPOTESTOSTERONE CYPIONATE) 200 MG/ML injection SMARTSIG:0.25 Milliliter(s) SUB-Q Twice a Week     valsartan -hydrochlorothiazide  (DIOVAN -HCT) 320-25 MG tablet Take 1 tablet by mouth daily. 90 tablet 3   No current facility-administered medications on file prior to visit.     Review of Systems  Constitutional:  Negative for fever.  Respiratory:  Negative for cough, shortness of breath and wheezing.   Cardiovascular:  Negative for chest pain, palpitations and leg swelling.  Neurological:  Positive for headaches (occ). Negative for light-headedness.       Objective:   Vitals:   07/08/24 0807  BP: 138/82  Pulse: 67   Temp: 98 F (36.7 C)  SpO2: 98%   BP Readings from Last 3 Encounters:  07/08/24 138/82  03/19/24 130/78  03/12/24 132/82   Wt Readings from Last 3 Encounters:  07/08/24 226 lb (102.5 kg)  03/19/24 235 lb 9.6 oz (106.9 kg)  03/12/24 236 lb (107 kg)   Body mass index is 30.65 kg/m.    Physical Exam Constitutional:      General: He is not in acute distress.    Appearance: Normal appearance. He is not ill-appearing.  HENT:     Head: Normocephalic and atraumatic.  Eyes:     Conjunctiva/sclera: Conjunctivae normal.  Cardiovascular:     Rate and Rhythm: Normal rate and regular rhythm.     Heart sounds: Normal heart sounds.  Pulmonary:     Effort: Pulmonary effort is normal. No respiratory distress.     Breath sounds: Normal breath sounds. No wheezing or rales.  Musculoskeletal:     Right lower leg: No edema.     Left lower leg: No edema.  Skin:    General: Skin is warm and dry.     Findings: No rash.  Neurological:     Mental Status: He is alert. Mental status is at baseline.  Psychiatric:        Mood and Affect: Mood normal.  Lab Results  Component Value Date   WBC 4.1 01/03/2024   HGB 14.9 01/03/2024   HCT 43.4 01/03/2024   PLT 311.0 01/03/2024   GLUCOSE 95 01/03/2024   CHOL 149 01/03/2024   TRIG 134.0 01/03/2024   HDL 36.50 (L) 01/03/2024   LDLDIRECT 110.0 11/30/2016   LDLCALC 86 01/03/2024   ALT 67 (H) 01/03/2024   AST 43 (H) 01/03/2024   NA 141 01/03/2024   K 3.7 01/03/2024   CL 101 01/03/2024   CREATININE 1.15 01/03/2024   BUN 12 01/03/2024   CO2 30 01/03/2024   TSH 2.52 12/16/2021   INR 1.0 01/04/2021   HGBA1C 5.4 01/03/2024     Assessment & Plan:   Tdap today   See Problem List for Assessment and Plan of chronic medical problems.

## 2024-07-08 ENCOUNTER — Ambulatory Visit: Admitting: Internal Medicine

## 2024-07-08 VITALS — BP 138/82 | HR 67 | Temp 98.0°F | Ht 72.0 in | Wt 226.0 lb

## 2024-07-08 DIAGNOSIS — K219 Gastro-esophageal reflux disease without esophagitis: Secondary | ICD-10-CM

## 2024-07-08 DIAGNOSIS — R739 Hyperglycemia, unspecified: Secondary | ICD-10-CM

## 2024-07-08 DIAGNOSIS — K760 Fatty (change of) liver, not elsewhere classified: Secondary | ICD-10-CM | POA: Diagnosis not present

## 2024-07-08 DIAGNOSIS — E781 Pure hyperglyceridemia: Secondary | ICD-10-CM | POA: Diagnosis not present

## 2024-07-08 DIAGNOSIS — I10 Essential (primary) hypertension: Secondary | ICD-10-CM

## 2024-07-08 DIAGNOSIS — Z23 Encounter for immunization: Secondary | ICD-10-CM | POA: Diagnosis not present

## 2024-07-08 LAB — COMPREHENSIVE METABOLIC PANEL WITH GFR
ALT: 30 U/L (ref 0–53)
AST: 29 U/L (ref 0–37)
Albumin: 4.4 g/dL (ref 3.5–5.2)
Alkaline Phosphatase: 41 U/L (ref 39–117)
BUN: 16 mg/dL (ref 6–23)
CO2: 28 meq/L (ref 19–32)
Calcium: 9.5 mg/dL (ref 8.4–10.5)
Chloride: 102 meq/L (ref 96–112)
Creatinine, Ser: 1.15 mg/dL (ref 0.40–1.50)
GFR: 75.1 mL/min (ref >60.00)
Glucose, Bld: 83 mg/dL (ref 70–99)
Potassium: 3.4 meq/L — ABNORMAL LOW (ref 3.5–5.1)
Sodium: 139 meq/L (ref 135–145)
Total Bilirubin: 0.8 mg/dL (ref 0.2–1.2)
Total Protein: 7.2 g/dL (ref 6.0–8.3)

## 2024-07-08 LAB — LIPID PANEL
Cholesterol: 159 mg/dL (ref 0–200)
HDL: 43.2 mg/dL (ref >39.00)
LDL Cholesterol: 93 mg/dL (ref 0–99)
NonHDL: 116.06
Total CHOL/HDL Ratio: 4
Triglycerides: 115 mg/dL (ref 0.0–149.0)
VLDL: 23 mg/dL (ref 0.0–40.0)

## 2024-07-08 LAB — HEMOGLOBIN A1C: Hgb A1c MFr Bld: 5.4 % (ref 4.6–6.5)

## 2024-07-08 NOTE — Assessment & Plan Note (Signed)
Chronic Check lipid panel, CMP Continue fenofibrate 145 mg daily Regular exercise and healthy diet encouraged Weight loss encouraged

## 2024-07-08 NOTE — Assessment & Plan Note (Signed)
 Chronic GERD controlled Continue pantoprazole 40 mg daily

## 2024-07-08 NOTE — Addendum Note (Signed)
 Addended by: CLAUDENE TOBIAS PARAS on: 07/08/2024 09:31 AM   Modules accepted: Orders

## 2024-07-08 NOTE — Assessment & Plan Note (Addendum)
 Chronic BP borderline - not ideally controlled Continue amlodipine  5 mg daily, Diovan -HCTZ 320-25 mg daily CMP, CBC Monitor BP at home / work for two weeks and update me -- can try increasing amlodipine  to 7.5 mg if not at goal

## 2024-07-08 NOTE — Assessment & Plan Note (Signed)
 Chronic Lab Results  Component Value Date   HGBA1C 5.4 01/03/2024   Check a1c Low sugar / carb diet Stressed regular exercise

## 2024-07-08 NOTE — Assessment & Plan Note (Signed)
 Chronic CMP, CBC continue regular exercise, healthy diet-specifically stressed low sugar diet Working on weight loss

## 2024-07-10 ENCOUNTER — Ambulatory Visit: Payer: Self-pay | Admitting: Internal Medicine

## 2024-11-30 ENCOUNTER — Other Ambulatory Visit: Payer: Self-pay | Admitting: Internal Medicine

## 2025-01-13 ENCOUNTER — Encounter: Admitting: Internal Medicine
